# Patient Record
Sex: Female | Born: 1960 | Race: White | Hispanic: No | Marital: Married | Smoking: Current some day smoker
Health system: Southern US, Community
[De-identification: ages and names within clinical notes are randomized; demographics above are authoritative.]

## PROBLEM LIST (undated history)

## (undated) DIAGNOSIS — R519 Headache, unspecified: Secondary | ICD-10-CM

## (undated) DIAGNOSIS — E119 Type 2 diabetes mellitus without complications: Secondary | ICD-10-CM

## (undated) DIAGNOSIS — Z972 Presence of dental prosthetic device (complete) (partial): Secondary | ICD-10-CM

## (undated) DIAGNOSIS — I6529 Occlusion and stenosis of unspecified carotid artery: Secondary | ICD-10-CM

## (undated) DIAGNOSIS — E785 Hyperlipidemia, unspecified: Secondary | ICD-10-CM

## (undated) DIAGNOSIS — M539 Dorsopathy, unspecified: Secondary | ICD-10-CM

## (undated) DIAGNOSIS — K760 Fatty (change of) liver, not elsewhere classified: Secondary | ICD-10-CM

## (undated) DIAGNOSIS — R42 Dizziness and giddiness: Secondary | ICD-10-CM

## (undated) DIAGNOSIS — I10 Essential (primary) hypertension: Secondary | ICD-10-CM

## (undated) DIAGNOSIS — R51 Headache: Secondary | ICD-10-CM

## (undated) DIAGNOSIS — G629 Polyneuropathy, unspecified: Secondary | ICD-10-CM

## (undated) HISTORY — DX: Fatty (change of) liver, not elsewhere classified: K76.0

## (undated) HISTORY — PX: TONSILLECTOMY AND ADENOIDECTOMY: SUR1326

## (undated) HISTORY — DX: Type 2 diabetes mellitus without complications: E11.9

## (undated) HISTORY — PX: LAPAROSCOPIC CHOLECYSTECTOMY: SUR755

## (undated) HISTORY — DX: Hyperlipidemia, unspecified: E78.5

## (undated) HISTORY — PX: CHOLECYSTECTOMY: SHX55

## (undated) HISTORY — DX: Essential (primary) hypertension: I10

## (undated) HISTORY — PX: HEMORRHOID SURGERY: SHX153

## (undated) HISTORY — PX: ABDOMINAL HYSTERECTOMY: SHX81

## (undated) HISTORY — PX: DILATION AND CURETTAGE OF UTERUS: SHX78

---

## 1998-03-22 DIAGNOSIS — F410 Panic disorder [episodic paroxysmal anxiety] without agoraphobia: Secondary | ICD-10-CM | POA: Insufficient documentation

## 1998-03-22 DIAGNOSIS — I10 Essential (primary) hypertension: Secondary | ICD-10-CM | POA: Insufficient documentation

## 2002-11-23 DIAGNOSIS — F32A Depression, unspecified: Secondary | ICD-10-CM | POA: Insufficient documentation

## 2002-11-23 DIAGNOSIS — F329 Major depressive disorder, single episode, unspecified: Secondary | ICD-10-CM | POA: Insufficient documentation

## 2005-03-03 ENCOUNTER — Ambulatory Visit: Payer: Self-pay | Admitting: Family Medicine

## 2006-02-15 DIAGNOSIS — E785 Hyperlipidemia, unspecified: Secondary | ICD-10-CM | POA: Insufficient documentation

## 2006-02-15 DIAGNOSIS — N951 Menopausal and female climacteric states: Secondary | ICD-10-CM | POA: Insufficient documentation

## 2006-02-23 ENCOUNTER — Ambulatory Visit: Payer: Self-pay | Admitting: Family Medicine

## 2006-04-14 ENCOUNTER — Ambulatory Visit: Payer: Self-pay | Admitting: Unknown Physician Specialty

## 2007-03-28 ENCOUNTER — Ambulatory Visit: Payer: Self-pay | Admitting: Family Medicine

## 2007-11-21 DIAGNOSIS — E559 Vitamin D deficiency, unspecified: Secondary | ICD-10-CM | POA: Insufficient documentation

## 2008-05-22 ENCOUNTER — Ambulatory Visit: Payer: Self-pay | Admitting: Family Medicine

## 2008-10-29 DIAGNOSIS — F17210 Nicotine dependence, cigarettes, uncomplicated: Secondary | ICD-10-CM | POA: Insufficient documentation

## 2009-06-03 ENCOUNTER — Ambulatory Visit: Payer: Self-pay | Admitting: General Surgery

## 2009-06-13 ENCOUNTER — Ambulatory Visit: Payer: Self-pay | Admitting: General Surgery

## 2009-06-24 ENCOUNTER — Ambulatory Visit: Payer: Self-pay | Admitting: General Surgery

## 2009-07-23 ENCOUNTER — Ambulatory Visit: Payer: Self-pay | Admitting: Family Medicine

## 2010-08-04 ENCOUNTER — Ambulatory Visit: Payer: Self-pay | Admitting: Family Medicine

## 2012-11-17 ENCOUNTER — Ambulatory Visit: Payer: Self-pay | Admitting: Family Medicine

## 2012-11-22 ENCOUNTER — Ambulatory Visit: Payer: Self-pay | Admitting: Family Medicine

## 2013-01-16 ENCOUNTER — Ambulatory Visit: Payer: Self-pay | Admitting: Family Medicine

## 2013-07-05 ENCOUNTER — Encounter: Payer: Self-pay | Admitting: Family Medicine

## 2014-08-25 ENCOUNTER — Other Ambulatory Visit: Payer: Self-pay | Admitting: Family Medicine

## 2014-08-29 ENCOUNTER — Other Ambulatory Visit: Payer: Self-pay | Admitting: Family Medicine

## 2014-10-08 ENCOUNTER — Telehealth: Payer: Self-pay | Admitting: Family Medicine

## 2014-10-08 ENCOUNTER — Other Ambulatory Visit: Payer: Self-pay | Admitting: Family Medicine

## 2014-10-08 MED ORDER — ALBUTEROL SULFATE HFA 108 (90 BASE) MCG/ACT IN AERS: 2.0000 | INHALATION_SPRAY | Freq: Four times a day (QID) | RESPIRATORY_TRACT | Status: DC | PRN

## 2014-10-08 NOTE — Telephone Encounter (Signed)
Pt states she is having some wheezing.  Does not want to come in but would like for you to call her in a inhaler.  [facility] [place]/  Thanks, [place]

## 2014-10-08 NOTE — Telephone Encounter (Signed)
Have sent rx for proventil to [facility] [place]

## 2014-10-11 NOTE — Telephone Encounter (Signed)
Patient notified

## 2014-10-27 ENCOUNTER — Other Ambulatory Visit: Payer: Self-pay | Admitting: Family Medicine

## 2014-11-05 ENCOUNTER — Ambulatory Visit: Payer: BC Managed Care – PPO | Admitting: Family Medicine

## 2014-11-05 DIAGNOSIS — E119 Type 2 diabetes mellitus without complications: Secondary | ICD-10-CM | POA: Insufficient documentation

## 2014-11-05 DIAGNOSIS — G8929 Other chronic pain: Secondary | ICD-10-CM | POA: Insufficient documentation

## 2014-11-05 DIAGNOSIS — M7060 Trochanteric bursitis, unspecified hip: Secondary | ICD-10-CM | POA: Insufficient documentation

## 2014-11-05 DIAGNOSIS — M549 Dorsalgia, unspecified: Secondary | ICD-10-CM

## 2014-11-05 DIAGNOSIS — M19049 Primary osteoarthritis, unspecified hand: Secondary | ICD-10-CM | POA: Insufficient documentation

## 2014-11-05 DIAGNOSIS — E1165 Type 2 diabetes mellitus with hyperglycemia: Secondary | ICD-10-CM

## 2014-11-30 ENCOUNTER — Other Ambulatory Visit: Payer: Self-pay | Admitting: Family Medicine

## 2014-12-12 ENCOUNTER — Encounter: Payer: Self-pay | Admitting: Family Medicine

## 2014-12-12 ENCOUNTER — Ambulatory Visit (INDEPENDENT_AMBULATORY_CARE_PROVIDER_SITE_OTHER): Payer: BC Managed Care – PPO | Admitting: Family Medicine

## 2014-12-12 ENCOUNTER — Other Ambulatory Visit: Payer: Self-pay

## 2014-12-12 VITALS — BP 124/82 | HR 72 | Temp 98.7°F | Resp 16 | Wt 130.2 lb

## 2014-12-12 DIAGNOSIS — K760 Fatty (change of) liver, not elsewhere classified: Secondary | ICD-10-CM | POA: Diagnosis not present

## 2014-12-12 DIAGNOSIS — IMO0002 Reserved for concepts with insufficient information to code with codable children: Secondary | ICD-10-CM

## 2014-12-12 DIAGNOSIS — E1165 Type 2 diabetes mellitus with hyperglycemia: Secondary | ICD-10-CM | POA: Diagnosis not present

## 2014-12-12 DIAGNOSIS — I10 Essential (primary) hypertension: Secondary | ICD-10-CM

## 2014-12-12 DIAGNOSIS — E785 Hyperlipidemia, unspecified: Secondary | ICD-10-CM | POA: Diagnosis not present

## 2014-12-12 NOTE — Progress Notes (Signed)
Subjective:    Patient ID: Dana Weber, female    DOB: [birth year], 54 y.o.   MRN: [id number] Chief Complaint  Patient presents with  . Follow-up    ultrasound results   HPI Fatty Liver Diagnosed by abdominal ultrasound in April 2016. Some gas and epigastric indigestion at times. Some foul odor and gas production. Use antacid occasionally. Has had a couple episodes of nausea and vomiting (once) without persistence or specific trigger. Denies constipation or diarrhea.  Diabetes Tolerating Metformin and Januvia without side effects. Blood sugar occasionally checked and ranging from 100-160 (random). Denies vision changes, numbness or polyuria.  Hypertension Continues Benazepril/HCTZ without side effects. No chest pains, dyspnea or diaphoresis. Still smoking at least <1/2 ppd.  Hyperlipidemia Trying to follow a low fat diet with continuation of Lovastatin 20 mg qd. Denies regular exercise.  Tobacco Use Presently on Chantix prescribed by a doctor at [facility]. Continues coaching by this doctor and will follow up next week for her 1 month recheck.  Patient Active Problem List   Diagnosis Date Noted  . Back pain, chronic 11/05/2014  . Type II diabetes mellitus, uncontrolled 11/05/2014  . Arthritis of hand, degenerative 11/05/2014  . Bursitis, trochanteric 11/05/2014  . Compulsive tobacco user syndrome 10/29/2008  . Avitaminosis D 11/21/2007  . HLD (hyperlipidemia) 02/15/2006  . Menopausal symptom 02/15/2006  . Clinical depression 11/23/2002  . Essential (primary) hypertension 03/22/1998  . Episodic paroxysmal anxiety disorder 03/22/1998   Family History  Problem Relation Age of Onset  . Diabetes Mother   . Hypertension Mother   . Diabetes Father   . Hypertension Father   . Hypertension Brother   . Diabetes Paternal Grandmother   . Emphysema Paternal Grandfather   . Hypertension Brother    Past Surgical History  Procedure Laterality Date  . Abdominal hysterectomy     . Cholecystectomy    . Cesarean section    . Tonsillectomy and adenoidectomy  1960   Social History  Substance Use Topics  . Smoking status: Current Every Day Smoker -- 0.50 packs/day for 30 years    Types: Cigarettes  . Smokeless tobacco: None  . Alcohol Use: 0.0 oz/week    0 Standard drinks or equivalent per week     Comment: OCCASIONALLY   Allergies  Allergen Reactions  . Amoxicillin   . Hydrocodone Bitartrate Er Swelling   Current Outpatient Prescriptions on File Prior to Visit  Medication Sig Dispense Refill  . albuterol (PROVENTIL HFA) 108 (90 BASE) MCG/ACT inhaler Inhale 2 puffs into the lungs every 6 (six) hours as needed for wheezing or shortness of breath. 1 Inhaler 5  . B COMPLEX VITAMINS PO Take 1 tablet by mouth daily.    . benazepril-hydrochlorthiazide (LOTENSIN HCT) 10-12.5 MG per tablet TAKE 1 TABLET BY MOUTH DAILY. 30 tablet 1  . fluticasone (FLONASE) 50 MCG/ACT nasal spray Place 2 sprays into the nose daily.    Marland Kitchen JANUVIA 100 MG tablet TAKE 1 TABLET BY MOUTH DAILY 30 tablet 0  . lovastatin (MEVACOR) 20 MG tablet Take 1 tablet by mouth daily.    . meloxicam (MOBIC) 15 MG tablet Take 1 tablet by mouth daily.    . metFORMIN (GLUMETZA) 500 MG (MOD) 24 hr tablet Take 2 tablets by mouth 2 (two) times daily.    Marland Kitchen PARoxetine (PAXIL) 20 MG tablet TAKE 1 TABLET BY MOUTH DAILY 90 tablet 3   No current facility-administered medications on file prior to visit.   Review of Systems  Constitutional: Negative.   HENT: Negative.   Eyes: Negative.   Respiratory: Positive for cough. Negative for apnea.        Morning cough with some sputum production (white to brown color)  Cardiovascular: Negative.   Gastrointestinal: Positive for nausea and anal bleeding. Negative for blood in stool.       History of hemorrhoidal bleeding occasionally.  Endocrine: Negative.   Genitourinary: Negative.   Neurological: Negative.      BP 124/82 mmHg  Pulse 72  Temp(Src) 98.7 F (37.1 C)  (Oral)  Resp 16  Wt 130 lb 3.2 oz (59.058 kg)  Objective:   Physical Exam  Constitutional: She is oriented to person, place, and time. She appears well-developed and well-nourished.  HENT:  Head: Normocephalic and atraumatic.  Nose: Nose normal.  Mouth/Throat: Oropharynx is clear and moist.  Eyes: Conjunctivae are normal.  Neck: Normal range of motion. Neck supple.  Cardiovascular: Normal rate, regular rhythm and normal heart sounds.   Pulmonary/Chest: Effort normal and breath sounds normal.  Slight wheeze that clears with a couple deep breaths.  Abdominal: Soft. Bowel sounds are normal.  Musculoskeletal: Normal range of motion.  Neurological: She is oriented to person, place, and time. She has normal reflexes.  Skin: No rash noted.  Psychiatric: She has a normal mood and affect. Her behavior is normal. Thought content normal.       Assessment & Plan:  1. Fatty liver  In April 2016 with normal lipase, liver enzymes and history of cholecystectomy in 1993, abdominal ultrasound showed echogenic liver that may reflect underlying hepatic steatosis. Never had any jaundice. Occasional abdominal discomfort in upper abdomen. Will recheck labs and recommend [place] diet. Probably should consider referral to gastroenterologist. - Comprehensive metabolic panel - Amylase  2. Essential (primary) hypertension BP in good control today. Still taking Lotensin-HCT 10/12.5 mg qd without side effects. Recheck routine labs and continue present dosage. - CBC with Differential/Platelet  3. Type II diabetes mellitus, uncontrolled Presently on Metformin 500 mg 2 tablets BID with Januvia 100 mg qd the past 4 months. Last Hgb A1C on 07-08-14 was 8.9 when she was switched back to this regimen. Will recheck labs. - Hemoglobin A1c  4. HLD (hyperlipidemia) Stable on Lovastatin 20 mg qd without side effect symptoms. No myalgias or joint pains. Will recheck lipids and proceed with [place] diet. Recheck pending lab  reports. - Lipid panel - TSH

## 2014-12-12 NOTE — Patient Instructions (Signed)
Fatty Liver  Fatty liver is the accumulation of fat in liver cells. It is also called hepatosteatosis or steatohepatitis. It is normal for your liver to contain some fat. If fat is more than 5 to 10% of your liver's weight, you have fatty liver.   There are often no symptoms (problems) for years while damage is still occurring. People often learn about their fatty liver when they have medical tests for other reasons. Fat can damage your liver for years or even decades without causing problems. When it becomes severe, it can cause fatigue, weight loss, weakness, and confusion.  This makes you more likely to develop more serious liver problems. The liver is the largest organ in the body. It does a lot of work and often gives no warning signs when it is sick until late in a disease.  The liver has many important jobs including:  · Breaking down foods.  · Storing vitamins, iron, and other minerals.  · Making proteins.  · Making bile for food digestion.  · Breaking down many products including medications, alcohol and some poisons.  CAUSES   There are a number of different conditions, medications, and poisons that can cause a fatty liver. Eating too many calories causes fat to build up in the liver. Not processing and breaking fats down normally may also cause this. Certain conditions, such as obesity, diabetes, and high triglycerides also cause this. Most fatty liver patients tend to be middle-aged and over weight.   Some causes of fatty liver are:  · Alcohol over consumption.  · Malnutrition.  · Steroid use.  · Valproic acid toxicity.  · Obesity.  · Cushing's syndrome.  · Poisons.  · Tetracycline in high dosages.  · Pregnancy.  · Diabetes.  · Hyperlipidemia.  · Rapid weight loss.  Some people develop fatty liver even having none of these conditions.  SYMPTOMS   Fatty liver most often causes no problems. This is called asymptomatic.  · It can be diagnosed with blood tests and also by a liver biopsy.  · It is one of the  most common causes of minor elevations of liver enzymes on routine blood tests.  · Specialized Imaging of the liver using ultrasound, CT (computed tomography) scan, or MRI (magnetic resonance imaging) can suggest a fatty liver but a biopsy is needed to confirm it.  · A biopsy involves taking a small sample of liver tissue. This is done by using a needle. It is then looked at under a microscope by a specialist.  TREATMENT   It is important to treat the cause. Simple fatty liver without a medical reason may not need treatment.  · Weight loss, fat restriction, and exercise in overweight patients produces inconsistent results but is worth trying.  · Fatty liver due to alcohol toxicity may not improve even with stopping drinking.  · Good control of diabetes may reduce fatty liver.  · Lower your triglycerides through diet, medication or both.  · Eat a balanced, healthy diet.  · Increase your physical activity.  · Get regular checkups from a liver specialist.  · There are no medical or surgical treatments for a fatty liver or Mom, but improving your diet and increasing your exercise may help prevent or reverse some of the damage.  PROGNOSIS   Fatty liver may cause no damage or it can lead to an inflammation of the liver. This is, called steatohepatitis. When it is linked to alcohol abuse, it is called alcoholic steatohepatitis. It often   is not linked to alcohol. It is then called nonalcoholic steatohepatitis, or Whisner. Over time the liver may become scarred and hardened. This condition is called cirrhosis. Cirrhosis is serious and may lead to liver failure or cancer. Bushey is one of the leading causes of cirrhosis. About 10-20% of Americans have fatty liver and a smaller 2-5% has Briddell.  Document Released: 04/23/2005 Document Revised: 05/31/2011 Document Reviewed: 07/18/2013  ExitCare® Patient Information ©2015 [employer]. This information is not intended to replace advice given to you by your health care provider. Make  sure you discuss any questions you have with your health care provider.

## 2014-12-13 LAB — COMPREHENSIVE METABOLIC PANEL
A/G RATIO: 1.9 (ref 1.1–2.5)
ALT: 39 IU/L — ABNORMAL HIGH (ref 0–32)
AST: 23 IU/L (ref 0–40)
Albumin: 4.4 g/dL (ref 3.5–5.5)
Alkaline Phosphatase: 75 IU/L (ref 39–117)
BILIRUBIN TOTAL: 0.4 mg/dL (ref 0.0–1.2)
BUN/Creatinine Ratio: 13 (ref 9–23)
BUN: 8 mg/dL (ref 6–24)
CALCIUM: 9.6 mg/dL (ref 8.7–10.2)
CO2: 26 mmol/L (ref 18–29)
Chloride: 97 mmol/L (ref 97–108)
Creatinine, Ser: 0.64 mg/dL (ref 0.57–1.00)
GFR calc Af Amer: 118 mL/min/{1.73_m2} (ref >59)
GFR, EST NON AFRICAN AMERICAN: 102 mL/min/{1.73_m2} (ref >59)
GLOBULIN, TOTAL: 2.3 g/dL (ref 1.5–4.5)
Glucose: 166 mg/dL — ABNORMAL HIGH (ref 65–99)
POTASSIUM: 4.7 mmol/L (ref 3.5–5.2)
SODIUM: 139 mmol/L (ref 134–144)
Total Protein: 6.7 g/dL (ref 6.0–8.5)

## 2014-12-13 LAB — LIPID PANEL
Chol/HDL Ratio: 4.1 ratio units (ref 0.0–4.4)
Cholesterol, Total: 170 mg/dL (ref 100–199)
HDL: 41 mg/dL (ref >39)
LDL CALC: 92 mg/dL (ref 0–99)
Triglycerides: 184 mg/dL — ABNORMAL HIGH (ref 0–149)
VLDL CHOLESTEROL CAL: 37 mg/dL (ref 5–40)

## 2014-12-13 LAB — HEMOGLOBIN A1C
ESTIMATED AVERAGE GLUCOSE: 203 mg/dL
Hgb A1c MFr Bld: 8.7 % — ABNORMAL HIGH (ref 4.8–5.6)

## 2014-12-13 LAB — CBC WITH DIFFERENTIAL/PLATELET
BASOS: 1 %
Basophils Absolute: 0.1 10*3/uL (ref 0.0–0.2)
EOS (ABSOLUTE): 0.1 10*3/uL (ref 0.0–0.4)
EOS: 2 %
HEMATOCRIT: 40.7 % (ref 34.0–46.6)
Hemoglobin: 13.8 g/dL (ref 11.1–15.9)
IMMATURE GRANULOCYTES: 0 %
Immature Grans (Abs): 0 10*3/uL (ref 0.0–0.1)
LYMPHS ABS: 1.7 10*3/uL (ref 0.7–3.1)
Lymphs: 32 %
MCH: 29.6 pg (ref 26.6–33.0)
MCHC: 33.9 g/dL (ref 31.5–35.7)
MCV: 87 fL (ref 79–97)
MONOS ABS: 0.4 10*3/uL (ref 0.1–0.9)
Monocytes: 8 %
NEUTROS ABS: 3.1 10*3/uL (ref 1.4–7.0)
Neutrophils: 57 %
Platelets: 295 10*3/uL (ref 150–379)
RBC: 4.66 x10E6/uL (ref 3.77–5.28)
RDW: 14.2 % (ref 12.3–15.4)
WBC: 5.4 10*3/uL (ref 3.4–10.8)

## 2014-12-13 LAB — AMYLASE: Amylase: 39 U/L (ref 31–124)

## 2014-12-13 LAB — TSH: TSH: 0.939 u[IU]/mL (ref 0.450–4.500)

## 2014-12-17 ENCOUNTER — Telehealth: Payer: Self-pay

## 2014-12-17 DIAGNOSIS — E119 Type 2 diabetes mellitus without complications: Secondary | ICD-10-CM

## 2014-12-17 MED ORDER — CANAGLIFLOZIN 100 MG PO TABS
100.0000 mg | ORAL_TABLET | Freq: Every day | ORAL | Status: DC
Start: 2014-12-17 — End: 2015-01-13

## 2014-12-17 NOTE — Telephone Encounter (Signed)
LMTCB

## 2014-12-17 NOTE — Telephone Encounter (Signed)
-----   Message from Tamsen Roers, [place] sent at 12/13/2014  6:12 PM EDT ----- Blood tests essentially normal except blood sugar high at 166 and Hgb A1C still above 8 (8.7 now - was 8.9 in April). Need better control of diabetes. Continue Metformin 500 mg 2 BID and Januvia 100 mg qd then add Invokana 100 mg q am (may give 25 tablet samples and recheck response in 3 weeks). If desired, could get gastroenterology referral to evaluate fatty liver changes. Usually the diet given would help.

## 2014-12-17 NOTE — Telephone Encounter (Signed)
Patient advised as directed below. Patient verbalized understanding and agrees with treatment plan. Invokana 100 mg samples left at front desk. Patient will try diet before proceeding with GI referral. Patient will schedule a follow up appointment when she comes in to pick up samples.

## 2014-12-25 ENCOUNTER — Other Ambulatory Visit: Payer: Self-pay | Admitting: Family Medicine

## 2015-01-03 ENCOUNTER — Other Ambulatory Visit: Payer: Self-pay | Admitting: Family Medicine

## 2015-01-03 NOTE — Telephone Encounter (Signed)
Pt stating she need a refill on JANUVIA 100 MG tablet [facility]. Pt states she only has 1 one.

## 2015-01-04 MED ORDER — SITAGLIPTIN PHOSPHATE 100 MG PO TABS: 100.0000 mg | ORAL_TABLET | Freq: Every day | ORAL | Status: DC

## 2015-01-09 LAB — HM MAMMOGRAPHY: HM Mammogram: NEGATIVE

## 2015-01-13 ENCOUNTER — Other Ambulatory Visit: Payer: Self-pay | Admitting: Family Medicine

## 2015-01-13 ENCOUNTER — Ambulatory Visit (INDEPENDENT_AMBULATORY_CARE_PROVIDER_SITE_OTHER): Payer: BC Managed Care – PPO | Admitting: Family Medicine

## 2015-01-13 ENCOUNTER — Encounter: Payer: Self-pay | Admitting: Family Medicine

## 2015-01-13 VITALS — BP 118/72 | HR 80 | Temp 99.2°F | Resp 12 | Wt 130.0 lb

## 2015-01-13 DIAGNOSIS — E1100 Type 2 diabetes mellitus with hyperosmolarity without nonketotic hyperglycemic-hyperosmolar coma (NKHHC): Secondary | ICD-10-CM | POA: Diagnosis not present

## 2015-01-13 DIAGNOSIS — M19042 Primary osteoarthritis, left hand: Secondary | ICD-10-CM | POA: Diagnosis not present

## 2015-01-13 DIAGNOSIS — K760 Fatty (change of) liver, not elsewhere classified: Secondary | ICD-10-CM

## 2015-01-13 DIAGNOSIS — M19041 Primary osteoarthritis, right hand: Secondary | ICD-10-CM | POA: Diagnosis not present

## 2015-01-13 DIAGNOSIS — R1010 Upper abdominal pain, unspecified: Secondary | ICD-10-CM

## 2015-01-13 MED ORDER — CANAGLIFLOZIN 100 MG PO TABS: 100.0000 mg | ORAL_TABLET | Freq: Every day | ORAL | Status: DC

## 2015-01-13 NOTE — Progress Notes (Signed)
Patient ID: Dana MimesMarcia Jolayne Weber, female   DOB: 08/20/1960, 54 y.o.   MRN: [id number]     Visit Date: 01/13/2015  Today's Provider: Dortha Kernennis Chrismon, PA   Chief Complaint  Patient presents with  . Diabetes   Subjective:    HPI Patient is here for diabetes follow up. Her last office visit was in September and at that time Theodis Satonvokana was added. Patient is tolerating it ok. She states her average sugar is about 130 but not necessarily fasting it can be during the day too. She can not say if sugar readings improved with the new medication, she states "depends on the time of the day."  Patient Active Problem List   Diagnosis Date Noted  . Fatty liver 12/12/2014  . Back pain, chronic 11/05/2014  . Type II diabetes mellitus, uncontrolled (HCC) 11/05/2014  . Arthritis of hand, degenerative 11/05/2014  . Bursitis, trochanteric 11/05/2014  . Compulsive tobacco user syndrome 10/29/2008  . Avitaminosis D 11/21/2007  . HLD (hyperlipidemia) 02/15/2006  . Menopausal symptom 02/15/2006  . Clinical depression 11/23/2002  . Essential (primary) hypertension 03/22/1998  . Episodic paroxysmal anxiety disorder 03/22/1998   Past Surgical History  Procedure Laterality Date  . Abdominal hysterectomy    . Cholecystectomy    . Cesarean section    . Tonsillectomy and adenoidectomy  1960   Family History  Problem Relation Age of Onset  . Diabetes Mother   . Hypertension Mother   . Diabetes Father   . Hypertension Father   . Hypertension Brother   . Diabetes Paternal Grandmother   . Emphysema Paternal Grandfather   . Hypertension Brother    Lab Results  Component Value Date   HGBA1C 8.7* 12/12/2014   Allergies  Allergen Reactions  . Amoxicillin   . Hydrocodone Bitartrate Er Swelling   Previous Medications   ALBUTEROL (PROVENTIL HFA) 108 (90 BASE) MCG/ACT INHALER    Inhale 2 puffs into the lungs every 6 (six) hours as needed for wheezing or shortness of breath.   B COMPLEX VITAMINS PO    Take  1 tablet by mouth daily.   BENAZEPRIL-HYDROCHLORTHIAZIDE (LOTENSIN HCT) 10-12.5 MG TABLET    TAKE 1 TABLET BY MOUTH DAILY.   CANAGLIFLOZIN (INVOKANA) 100 MG TABS TABLET    Take 1 tablet (100 mg total) by mouth daily.   CHANTIX CONTINUING MONTH PAK 1 MG TABLET       FLUTICASONE (FLONASE) 50 MCG/ACT NASAL SPRAY    Place 2 sprays into the nose daily.   LOVASTATIN (MEVACOR) 20 MG TABLET    Take 1 tablet by mouth daily.   MELOXICAM (MOBIC) 15 MG TABLET    Take 1 tablet by mouth daily.   METFORMIN (GLUMETZA) 500 MG (MOD) 24 HR TABLET    Take 2 tablets by mouth 2 (two) times daily.   PAROXETINE (PAXIL) 20 MG TABLET    TAKE 1 TABLET BY MOUTH DAILY   SITAGLIPTIN (JANUVIA) 100 MG TABLET    Take 1 tablet (100 mg total) by mouth daily.   Review of Systems  Constitutional: Positive for fatigue. Negative for fever, chills, appetite change and unexpected weight change.  Respiratory: Positive for cough (since saturday) and wheezing (better).   Cardiovascular: Positive for leg swelling (ankles swell). Negative for chest pain and palpitations.  Gastrointestinal: Positive for nausea, abdominal pain (unchanged), diarrhea and constipation. Negative for blood in stool.  Musculoskeletal: Positive for back pain, joint swelling and arthralgias. Negative for gait problem.  Neurological: Positive for dizziness, light-headedness and headaches.  Social History  Substance Use Topics  . Smoking status: Current Every Day Smoker -- 0.25 packs/day for 30 years    Types: Cigarettes  . Smokeless tobacco: Never Used  . Alcohol Use: 0.0 oz/week    0 Standard drinks or equivalent per week     Comment: OCCASIONALLY   Objective:   BP 118/72 mmHg  Pulse 80  Temp(Src) 99.2 F (37.3 C)  Resp 12  Wt 130 lb (58.968 kg)  Physical Exam  Constitutional: She is oriented to person, place, and time. She appears well-developed and well-nourished.  HENT:  Head: Normocephalic.  Right Ear: External ear normal.  Left Ear:  External ear normal.  Cardiovascular: Normal rate and regular rhythm.   Pulmonary/Chest: Effort normal and breath sounds normal.  Slight wheeze with first breath then goes away.  Abdominal: Soft. Bowel sounds are normal. There is tenderness.  Soreness in RUQ. No masses. History of fatty liver.  Neurological: She is alert and oriented to person, place, and time.  Normal sensation in feet to test with nylon string.  Psychiatric: She has a normal mood and affect. Her behavior is normal. Thought content normal.   Assessment & Plan:     1. Uncontrolled type 2 diabetes mellitus with hyperosmolarity without coma, without long-term current use of insulin (HCC) Better control of blood sugar at home. Continues to take the Metformin, Januvia and Invokana without side effects. Will continue present diet and recheck Hgb A1C in 2 months. - canagliflozin (INVOKANA) 100 MG TABS tablet; Take 1 tablet (100 mg total) by mouth daily.  Dispense: 30 tablet; Refill: 3  2. Osteoarthritis of both hands, unspecified osteoarthritis type Intermittent ache and puffiness in hands. May use Meloxicam as needed.  3. Fatty liver Some soreness in RUQ. Last month labs showed only slight elevation of one liver enzyme (ALT 39). Will get follow up labs and check pancreas. - COMPLETE METABOLIC PANEL WITH GFR - Amylase  4. Upper abdominal pain Intermittent LUQ discomfort. Concerned about pancreas function. Will check CMP and Amylase. No pain today. May continue antacid prn.       Dortha Kern, PA  [facility]

## 2015-01-14 LAB — COMPREHENSIVE METABOLIC PANEL
ALT: 32 IU/L (ref 0–32)
AST: 15 IU/L (ref 0–40)
Albumin/Globulin Ratio: 2.1 (ref 1.1–2.5)
Albumin: 4.4 g/dL (ref 3.5–5.5)
Alkaline Phosphatase: 71 IU/L (ref 39–117)
BUN/Creatinine Ratio: 16 (ref 9–23)
BUN: 10 mg/dL (ref 6–24)
Bilirubin Total: 0.5 mg/dL (ref 0.0–1.2)
CALCIUM: 10.1 mg/dL (ref 8.7–10.2)
CO2: 25 mmol/L (ref 18–29)
Chloride: 99 mmol/L (ref 97–106)
Creatinine, Ser: 0.64 mg/dL (ref 0.57–1.00)
GFR, EST AFRICAN AMERICAN: 118 mL/min/{1.73_m2} (ref >59)
GFR, EST NON AFRICAN AMERICAN: 102 mL/min/{1.73_m2} (ref >59)
GLUCOSE: 159 mg/dL — AB (ref 65–99)
Globulin, Total: 2.1 g/dL (ref 1.5–4.5)
Potassium: 4.9 mmol/L (ref 3.5–5.2)
Sodium: 142 mmol/L (ref 136–144)
TOTAL PROTEIN: 6.5 g/dL (ref 6.0–8.5)

## 2015-01-14 LAB — AMYLASE: Amylase: 46 U/L (ref 31–124)

## 2015-01-15 ENCOUNTER — Other Ambulatory Visit: Payer: Self-pay

## 2015-01-15 DIAGNOSIS — E1100 Type 2 diabetes mellitus with hyperosmolarity without nonketotic hyperglycemic-hyperosmolar coma (NKHHC): Secondary | ICD-10-CM

## 2015-01-15 MED ORDER — CANAGLIFLOZIN 100 MG PO TABS: 100.0000 mg | ORAL_TABLET | Freq: Every day | ORAL | Status: DC

## 2015-01-15 NOTE — Telephone Encounter (Signed)
Patient states that Maurine MinisterDennis was suppose to send a refill for Invokana to [facility] on 01/13/2015. It appears RX was not changed in system so it was not escribed. RX sent to [facility].

## 2015-01-16 ENCOUNTER — Telehealth: Payer: Self-pay

## 2015-01-16 DIAGNOSIS — K760 Fatty (change of) liver, not elsewhere classified: Secondary | ICD-10-CM

## 2015-01-16 NOTE — Telephone Encounter (Signed)
-----   Message from Tamsen Roersennis E Chrismon, [place] sent at 01/16/2015  2:18 AM EDT ----- Blood sugar a little better. Liver enzymes and pancreas test all in the normal range now. Recommend probiotics to help with digestion and may use Gas-X if any gas build up. Continue present diabetes regimen and recheck Hgb A1C in 2 months as planned.

## 2015-01-16 NOTE — Telephone Encounter (Signed)
Patient advised as directed below. Patient verbalized understanding. Patient requested to proceed with GI referral as indicated in message on 12/17/2014.

## 2015-01-16 NOTE — Telephone Encounter (Signed)
LMTCB

## 2015-01-17 ENCOUNTER — Telehealth: Payer: Self-pay

## 2015-01-17 NOTE — Telephone Encounter (Signed)
GI referral ordered

## 2015-01-17 NOTE — Telephone Encounter (Signed)
Patient had mammogram done on 01/09/2015. Results are in and have been reviewed by Dr. Sherrie MustacheFisher. Results: Bi-rads Cat 1 negative. Tried calling patient to advised her of results. Left message to call back. Please advise patient of these results.

## 2015-01-22 NOTE — Telephone Encounter (Signed)
Left message on vm advising pt to return call.

## 2015-01-30 ENCOUNTER — Telehealth: Payer: Self-pay | Admitting: Family Medicine

## 2015-01-30 ENCOUNTER — Other Ambulatory Visit: Payer: Self-pay | Admitting: Family Medicine

## 2015-01-30 NOTE — Telephone Encounter (Signed)
Pt is returning call.  ZO#[phone]/[id number]#[phone]/MW

## 2015-01-30 NOTE — Telephone Encounter (Signed)
LMOVM for pt to return call 

## 2015-01-31 ENCOUNTER — Telehealth: Payer: Self-pay | Admitting: Family Medicine

## 2015-01-31 NOTE — Telephone Encounter (Signed)
LMOVM for pt to return call 

## 2015-01-31 NOTE — Telephone Encounter (Signed)
Patient notified of results.

## 2015-01-31 NOTE — Telephone Encounter (Signed)
Pt returning call.  UJ#[phone]/[id number]#[phone]/MW

## 2015-01-31 NOTE — Telephone Encounter (Signed)
Patient was notified of results.  

## 2015-01-31 NOTE — Telephone Encounter (Signed)
Patient returned call

## 2015-02-27 ENCOUNTER — Other Ambulatory Visit: Payer: Self-pay | Admitting: Family Medicine

## 2015-03-03 ENCOUNTER — Encounter (INDEPENDENT_AMBULATORY_CARE_PROVIDER_SITE_OTHER): Payer: Self-pay

## 2015-03-03 ENCOUNTER — Ambulatory Visit (INDEPENDENT_AMBULATORY_CARE_PROVIDER_SITE_OTHER): Payer: BC Managed Care – PPO | Admitting: Gastroenterology

## 2015-03-03 ENCOUNTER — Encounter: Payer: Self-pay | Admitting: Gastroenterology

## 2015-03-03 VITALS — BP 148/86 | HR 84 | Temp 98.9°F | Ht 60.0 in | Wt 132.0 lb

## 2015-03-03 DIAGNOSIS — R197 Diarrhea, unspecified: Secondary | ICD-10-CM | POA: Diagnosis not present

## 2015-03-03 DIAGNOSIS — R1115 Cyclical vomiting syndrome unrelated to migraine: Secondary | ICD-10-CM

## 2015-03-03 DIAGNOSIS — G43A0 Cyclical vomiting, not intractable: Secondary | ICD-10-CM | POA: Diagnosis not present

## 2015-03-03 NOTE — Progress Notes (Signed)
Gastroenterology Consultation  Referring Provider:     Malva Limes, MD Primary Care Physician:  Mila Merry, MD Primary Gastroenterologist:  Dr. Servando Snare     Reason for Consultation:  Diarrhea with nausea and fatty liver        HPI:   Dana Weber is a 54 y.o. y/o female referred for consultation & management of  Diarrhea with nausea by Dr. Mila Merry, MD.   This patient comes today for consultation after being reported to have fatty liver. The patient ultrasound is not readily available. The patient does report that she is having diarrhea that has been persistent the last six months. She also reports that she has had some minor weight loss with the diarrhea. She has abdominal pain that is in multiple locations. She also reports that she has abdominal pain right after eating. She states she had a colonoscopy in the past by a local surgeon and was told that she had polyps. She is not sure if she was told that she need a repeat colonoscopy. The patient has had continuous diarrhea multiple times a day. She denies any black stools or bloody stools. She also denies the diarrhea waking her up in the middle of night sleep.  Past Medical History  Diagnosis Date  . Hypertension   . Hyperlipidemia   . Fatty liver   . Diabetes mellitus without complication [facility])     Past Surgical History  Procedure Laterality Date  . Abdominal hysterectomy    . Cholecystectomy    . Cesarean section    . Tonsillectomy and adenoidectomy  1960  . Hemorrhoid surgery      Prior to Admission medications   Medication Sig Start Date End Date Taking? Authorizing Provider  albuterol (PROVENTIL HFA) 108 (90 BASE) MCG/ACT inhaler Inhale 2 puffs into the lungs every 6 (six) hours as needed for wheezing or shortness of breath. 10/08/14  Yes Malva Limes, MD  benazepril-hydrochlorthiazide (LOTENSIN HCT) 10-12.5 MG tablet TAKE 1 TABLET BY MOUTH DAILY. 02/27/15  Yes Malva Limes, MD  CHANTIX CONTINUING  MONTH PAK 1 MG tablet  12/05/14  Yes Historical Provider, MD  fluticasone (FLONASE) 50 MCG/ACT nasal spray Place 2 sprays into the nose daily. 03/08/14  Yes Historical Provider, MD  lovastatin (MEVACOR) 20 MG tablet TAKE 1 TABLET BY MOUTH EVERY DAY 01/30/15  Yes Malva Limes, MD  metFORMIN (GLUMETZA) 500 MG (MOD) 24 hr tablet Take 2 tablets by mouth 2 (two) times daily. 12/17/13  Yes Historical Provider, MD  PARoxetine (PAXIL) 20 MG tablet TAKE 1 TABLET BY MOUTH DAILY 08/25/14  Yes Malva Limes, MD  sitaGLIPtin (JANUVIA) 100 MG tablet Take 1 tablet (100 mg total) by mouth daily. 01/04/15  Yes Malva Limes, MD  B COMPLEX VITAMINS PO Take 1 tablet by mouth daily.    Historical Provider, MD  canagliflozin (INVOKANA) 100 MG TABS tablet Take 1 tablet (100 mg total) by mouth daily. Patient not taking: Reported on 03/03/2015 01/15/15   Jodell Cipro Chrismon, PA  meloxicam (MOBIC) 15 MG tablet Take 1 tablet by mouth daily. 12/12/12   Historical Provider, MD    Family History  Problem Relation Age of Onset  . Diabetes Mother   . Hypertension Mother   . Diabetes Father   . Hypertension Father   . Hypertension Brother   . Diabetes Paternal Grandmother   . Emphysema Paternal Grandfather   . Hypertension Brother      Social History  Substance  Use Topics  . Smoking status: Current Every Day Smoker -- 0.25 packs/day for 30 years    Types: Cigarettes  . Smokeless tobacco: Never Used  . Alcohol Use: 0.0 oz/week    0 Standard drinks or equivalent per week     Comment: OCCASIONALLY    Allergies as of 03/03/2015 - Review Complete 03/03/2015  Allergen Reaction Noted  . Amoxicillin  11/05/2014  . Hydrocodone bitartrate er Swelling 11/05/2014    Review of Systems:    All systems reviewed and negative except where noted in HPI.   Physical Exam:  BP 148/86 mmHg  Pulse 84  Temp(Src) 98.9 F (37.2 C) (Oral)  Ht 5' (1.524 m)  Wt 132 lb (59.875 kg)  BMI 25.78 kg/m2 No LMP recorded. Patient has  had a hysterectomy. Psych:  Alert and cooperative. Normal mood and affect. General:   Alert,  Well-developed, well-nourished, pleasant and cooperative in NAD Head:  Normocephalic and atraumatic. Eyes:  Sclera clear, no icterus.   Conjunctiva pink. Ears:  Normal auditory acuity. Nose:  No deformity, discharge, or lesions. Mouth:  No deformity or lesions,oropharynx pink & moist. Neck:  Supple; no masses or thyromegaly. Lungs:  Respirations even and unlabored.  Clear throughout to auscultation.   No wheezes, crackles, or rhonchi. No acute distress. Heart:  Regular rate and rhythm; no murmurs, clicks, rubs, or gallops. Abdomen:  Normal bowel sounds.  No bruits.  Soft, non-tender and non-distended without masses, hepatosplenomegaly or hernias noted.  No guarding or rebound tenderness.  Negative Carnett sign.   Rectal:  Deferred.  Msk:  Symmetrical without gross deformities.  Good, equal movement & strength bilaterally. Pulses:  Normal pulses noted. Extremities:  No clubbing or edema.  No cyanosis. Neurologic:  Alert and oriented x3;  grossly normal neurologically. Skin:  Intact without significant lesions or rashes.  No jaundice. Lymph Nodes:  No significant cervical adenopathy. Psych:  Alert and cooperative. Normal mood and affect.  Imaging Studies: No results found.  Assessment and Plan:   Dana Weber is a 54 y.o. y/o female  who comes today with a history of diarrhea and nausea. There is some mention of the patient having fatty liver although her liver enzymes are normal and I do not see an ultrasound for this patient. Despite the patient possibly having a fatty liver, her liver enzymes are normal and need no further workup. As for her chronic diarrhea and nausea with abdominal pain and multiple locations, she will be set up for an EGD and colonoscopy. She also has a history of colon polyps.I have discussed risks & benefits which include, but are not limited to, bleeding, infection,  perforation & drug reaction.  The patient agrees with this plan & written consent will be obtained.      Note: This dictation was prepared with Dragon dictation along with smaller phrase technology. Any transcriptional errors that result from this process are unintentional.

## 2015-03-04 ENCOUNTER — Other Ambulatory Visit: Payer: Self-pay

## 2015-03-12 ENCOUNTER — Encounter: Payer: Self-pay | Admitting: *Deleted

## 2015-03-19 NOTE — Discharge Instructions (Signed)

## 2015-03-20 ENCOUNTER — Encounter
Admission: RE | Disposition: A | Discharge status: Home / Self Care | Payer: Self-pay | Source: Ambulatory Visit | Attending: Gastroenterology

## 2015-03-20 ENCOUNTER — Ambulatory Visit: Payer: BC Managed Care – PPO | Admitting: Student in an Organized Health Care Education/Training Program

## 2015-03-20 ENCOUNTER — Ambulatory Visit
Admission: RE | Admit: 2015-03-20 | Discharge: 2015-03-20 | Disposition: A | Discharge status: Home / Self Care | Payer: BC Managed Care – PPO | Source: Ambulatory Visit | Attending: Gastroenterology | Admitting: Gastroenterology

## 2015-03-20 ENCOUNTER — Other Ambulatory Visit: Payer: Self-pay | Admitting: Gastroenterology

## 2015-03-20 DIAGNOSIS — Z825 Family history of asthma and other chronic lower respiratory diseases: Secondary | ICD-10-CM | POA: Insufficient documentation

## 2015-03-20 DIAGNOSIS — K760 Fatty (change of) liver, not elsewhere classified: Secondary | ICD-10-CM | POA: Diagnosis not present

## 2015-03-20 DIAGNOSIS — Z7951 Long term (current) use of inhaled steroids: Secondary | ICD-10-CM | POA: Insufficient documentation

## 2015-03-20 DIAGNOSIS — Z885 Allergy status to narcotic agent status: Secondary | ICD-10-CM | POA: Diagnosis not present

## 2015-03-20 DIAGNOSIS — K449 Diaphragmatic hernia without obstruction or gangrene: Secondary | ICD-10-CM | POA: Insufficient documentation

## 2015-03-20 DIAGNOSIS — E119 Type 2 diabetes mellitus without complications: Secondary | ICD-10-CM | POA: Diagnosis not present

## 2015-03-20 DIAGNOSIS — I10 Essential (primary) hypertension: Secondary | ICD-10-CM | POA: Diagnosis not present

## 2015-03-20 DIAGNOSIS — R42 Dizziness and giddiness: Secondary | ICD-10-CM | POA: Insufficient documentation

## 2015-03-20 DIAGNOSIS — K5730 Diverticulosis of large intestine without perforation or abscess without bleeding: Secondary | ICD-10-CM | POA: Insufficient documentation

## 2015-03-20 DIAGNOSIS — Z860100 Personal history of colon polyps, unspecified: Secondary | ICD-10-CM | POA: Insufficient documentation

## 2015-03-20 DIAGNOSIS — R110 Nausea: Secondary | ICD-10-CM | POA: Insufficient documentation

## 2015-03-20 DIAGNOSIS — F418 Other specified anxiety disorders: Secondary | ICD-10-CM | POA: Insufficient documentation

## 2015-03-20 DIAGNOSIS — Z881 Allergy status to other antibiotic agents status: Secondary | ICD-10-CM | POA: Diagnosis not present

## 2015-03-20 DIAGNOSIS — Z8249 Family history of ischemic heart disease and other diseases of the circulatory system: Secondary | ICD-10-CM | POA: Insufficient documentation

## 2015-03-20 DIAGNOSIS — Z79899 Other long term (current) drug therapy: Secondary | ICD-10-CM | POA: Diagnosis not present

## 2015-03-20 DIAGNOSIS — K2970 Gastritis, unspecified, without bleeding: Secondary | ICD-10-CM | POA: Diagnosis not present

## 2015-03-20 DIAGNOSIS — K640 First degree hemorrhoids: Secondary | ICD-10-CM | POA: Diagnosis not present

## 2015-03-20 DIAGNOSIS — Z9049 Acquired absence of other specified parts of digestive tract: Secondary | ICD-10-CM | POA: Insufficient documentation

## 2015-03-20 DIAGNOSIS — M1990 Unspecified osteoarthritis, unspecified site: Secondary | ICD-10-CM | POA: Diagnosis not present

## 2015-03-20 DIAGNOSIS — Z90710 Acquired absence of both cervix and uterus: Secondary | ICD-10-CM | POA: Insufficient documentation

## 2015-03-20 DIAGNOSIS — E785 Hyperlipidemia, unspecified: Secondary | ICD-10-CM | POA: Diagnosis not present

## 2015-03-20 DIAGNOSIS — R197 Diarrhea, unspecified: Secondary | ICD-10-CM | POA: Insufficient documentation

## 2015-03-20 DIAGNOSIS — F17210 Nicotine dependence, cigarettes, uncomplicated: Secondary | ICD-10-CM | POA: Diagnosis not present

## 2015-03-20 DIAGNOSIS — Z7984 Long term (current) use of oral hypoglycemic drugs: Secondary | ICD-10-CM | POA: Insufficient documentation

## 2015-03-20 HISTORY — DX: Headache: R51

## 2015-03-20 HISTORY — DX: Presence of dental prosthetic device (complete) (partial): Z97.2

## 2015-03-20 HISTORY — DX: Dizziness and giddiness: R42

## 2015-03-20 HISTORY — PX: ESOPHAGOGASTRODUODENOSCOPY (EGD) WITH PROPOFOL: SHX5813

## 2015-03-20 HISTORY — DX: Headache, unspecified: R51.9

## 2015-03-20 HISTORY — PX: COLONOSCOPY WITH PROPOFOL: SHX5780

## 2015-03-20 HISTORY — DX: Dorsopathy, unspecified: M53.9

## 2015-03-20 LAB — GLUCOSE, CAPILLARY
Glucose-Capillary: 216 mg/dL — ABNORMAL HIGH (ref 65–99)
Glucose-Capillary: 164 mg/dL — ABNORMAL HIGH (ref 65–99)

## 2015-03-20 SURGERY — COLONOSCOPY WITH PROPOFOL
Anesthesia: Monitor Anesthesia Care | Wound class: Contaminated

## 2015-03-20 MED ORDER — HYDROMORPHONE HCL 1 MG/ML IJ SOLN: 0.2500 mg | INTRAMUSCULAR | Status: DC | PRN

## 2015-03-20 MED ORDER — LACTATED RINGERS IV SOLN
INTRAVENOUS | Status: DC
Start: 2015-03-20 — End: 2015-03-20
  Administered 2015-03-20: 07:00:00 via INTRAVENOUS

## 2015-03-20 MED ORDER — OXYCODONE HCL 5 MG/5ML PO SOLN: 5.0000 mg | Freq: Once | ORAL | Status: DC | PRN

## 2015-03-20 MED ORDER — STERILE WATER FOR IRRIGATION IR SOLN
Status: DC | PRN
  Administered 2015-03-20: 08:00:00

## 2015-03-20 MED ORDER — LIDOCAINE HCL (CARDIAC) 20 MG/ML IV SOLN
INTRAVENOUS | Status: DC | PRN
  Administered 2015-03-20: 40 mg via INTRAVENOUS

## 2015-03-20 MED ORDER — PROPOFOL 10 MG/ML IV BOLUS
INTRAVENOUS | Status: DC | PRN
  Administered 2015-03-20: 60 mg via INTRAVENOUS
  Administered 2015-03-20 (×3): 30 mg via INTRAVENOUS
  Administered 2015-03-20: 10 mg via INTRAVENOUS
  Administered 2015-03-20 (×2): 20 mg via INTRAVENOUS
  Administered 2015-03-20: 30 mg via INTRAVENOUS

## 2015-03-20 MED ORDER — GLYCOPYRROLATE 0.2 MG/ML IJ SOLN
INTRAMUSCULAR | Status: DC | PRN
Start: 2015-03-20 — End: 2015-03-20
  Administered 2015-03-20: .1 mg via INTRAVENOUS

## 2015-03-20 MED ORDER — OXYCODONE HCL 5 MG PO TABS: 5.0000 mg | ORAL_TABLET | Freq: Once | ORAL | Status: DC | PRN

## 2015-03-20 MED ORDER — PROMETHAZINE HCL 25 MG/ML IJ SOLN: 6.2500 mg | INTRAMUSCULAR | Status: DC | PRN

## 2015-03-20 MED ORDER — MEPERIDINE HCL 25 MG/ML IJ SOLN: 6.2500 mg | INTRAMUSCULAR | Status: DC | PRN

## 2015-03-20 SURGICAL SUPPLY — 39 items

## 2015-03-20 NOTE — Anesthesia Procedure Notes (Signed)
Procedure Name: MAC Performed by: Diontre Harps Pre-anesthesia Checklist: Patient identified, Emergency Drugs available, Suction available, Timeout performed and Patient being monitored Patient Re-evaluated:Patient Re-evaluated prior to inductionOxygen Delivery Method: Nasal cannula Placement Confirmation: positive ETCO2       

## 2015-03-20 NOTE — H&P (Signed)
[facility]  [address] [place], [place] [address] Phone: [phone] Fax : [phone]  Primary Care Physician:  Mila Merryonald Fisher, MD Primary Gastroenterologist:  Dr. Servando SnareWohl  Pre-Procedure History & Physical: HPI:  Dana Weber is a 54 y.o. female is here for an endoscopy and colonoscopy.   Past Medical History  Diagnosis Date  . Hypertension   . Hyperlipidemia   . Fatty liver   . Diabetes mellitus without complication (HCC)   . Headache     sinus  . Multilevel degenerative disc disease     "spurs" on spine also  . Vertigo     mild - daily - positional  . Wears dentures     partial lower    Past Surgical History  Procedure Laterality Date  . Abdominal hysterectomy    . Cholecystectomy    . Cesarean section    . Tonsillectomy and adenoidectomy  1960  . Hemorrhoid surgery    . Dilation and curettage of uterus      Prior to Admission medications   Medication Sig Start Date End Date Taking? Authorizing Provider  albuterol (PROVENTIL HFA) 108 (90 BASE) MCG/ACT inhaler Inhale 2 puffs into the lungs every 6 (six) hours as needed for wheezing or shortness of breath. 10/08/14  Yes Malva Limesonald E Fisher, MD  benazepril-hydrochlorthiazide (LOTENSIN HCT) 10-12.5 MG tablet TAKE 1 TABLET BY MOUTH DAILY. 02/27/15  Yes Malva Limesonald E Fisher, MD  CHANTIX CONTINUING MONTH PAK 1 MG tablet  12/05/14  Yes Historical Provider, MD  fluticasone (FLONASE) 50 MCG/ACT nasal spray Place 2 sprays into the nose daily. 03/08/14  Yes Historical Provider, MD  lovastatin (MEVACOR) 20 MG tablet TAKE 1 TABLET BY MOUTH EVERY DAY 01/30/15  Yes Malva Limesonald E Fisher, MD  metFORMIN (GLUMETZA) 500 MG (MOD) 24 hr tablet Take 2 tablets by mouth 2 (two) times daily. 12/17/13  Yes Historical Provider, MD  PARoxetine (PAXIL) 20 MG tablet TAKE 1 TABLET BY MOUTH DAILY 08/25/14  Yes Malva Limesonald E Fisher, MD  sitaGLIPtin (JANUVIA) 100 MG tablet Take 1 tablet (100 mg total) by mouth daily. 01/04/15  Yes Malva Limesonald E  Fisher, MD  B COMPLEX VITAMINS PO Take 1 tablet by mouth daily.    Historical Provider, MD  canagliflozin (INVOKANA) 100 MG TABS tablet Take 1 tablet (100 mg total) by mouth daily. Patient not taking: Reported on 03/03/2015 01/15/15   Jodell Ciproennis E Chrismon, PA  meloxicam (MOBIC) 15 MG tablet Take 1 tablet by mouth daily. 12/12/12   Historical Provider, MD    Allergies as of 03/04/2015 - Review Complete 03/03/2015  Allergen Reaction Noted  . Amoxicillin  11/05/2014  . Hydrocodone bitartrate er Swelling 11/05/2014    Family History  Problem Relation Age of Onset  . Diabetes Mother   . Hypertension Mother   . Diabetes Father   . Hypertension Father   . Hypertension Brother   . Diabetes Paternal Grandmother   . Emphysema Paternal Grandfather   . Hypertension Brother     Social History   Social History  . Marital Status: Divorced    Spouse Name: N/A  . Number of Children: N/A  . Years of Education: N/A   Occupational History  . Not on file.   Social History Main Topics  . Smoking status: Current Every Day Smoker -- 0.25 packs/day for 30 years    Types: Cigarettes  . Smokeless tobacco: Never Used     Comment: trying to quit by end of 2016  . Alcohol Use: 0.0  oz/week    0 Standard drinks or equivalent per week     Comment: OCCASIONALLY  . Drug Use: No  . Sexual Activity: Not on file   Other Topics Concern  . Not on file   Social History Narrative    Review of Systems: See HPI, otherwise negative ROS  Physical Exam: BP 164/94 mmHg  Pulse 84  Temp(Src) 98.1 F (36.7 C)  Resp 16  Ht 5' (1.524 m)  Wt 124 lb (56.246 kg)  BMI 24.22 kg/m2  SpO2 97% General:   Alert,  pleasant and cooperative in NAD Head:  Normocephalic and atraumatic. Neck:  Supple; no masses or thyromegaly. Lungs:  Clear throughout to auscultation.    Heart:  Regular rate and rhythm. Abdomen:  Soft, nontender and nondistended. Normal bowel sounds, without guarding, and without rebound.     Neurologic:  Alert and  oriented x4;  grossly normal neurologically.  Impression/Plan: Dana Weber is here for an endoscopy and colonoscopy to be performed for diarrhea and nausea  Risks, benefits, limitations, and alternatives regarding  endoscopy and colonoscopy have been reviewed with the patient.  Questions have been answered.  All parties agreeable.   Darlina Rumpf, MD  03/20/2015, 7:38 AM

## 2015-03-20 NOTE — Op Note (Signed)
[facility] Gastroenterology Patient Name: Dana MurrayMarcia Wesely Procedure Date: 03/20/2015 7:55 AM MRN: [id number] Account #: [id number] Date of Birth: 03/26/1960 Admit Type: Outpatient Age: 54 [facility] Gender: Female Note Status: Finalized Procedure:         Upper GI endoscopy Indications:       Nausea Providers:         Midge Miniumarren Lindon Kiel, MD Referring MD:      Demetrios Isaacsonald E. Sherrie MustacheFisher, MD (Referring MD) Medicines:         Propofol per Anesthesia Complications:     No immediate complications. Procedure:         Pre-Anesthesia Assessment:                    - Prior to the procedure, a History and Physical was                     performed, and patient medications and allergies were                     reviewed. The patient's tolerance of previous anesthesia                     was also reviewed. The risks and benefits of the procedure                     and the sedation options and risks were discussed with the                     patient. All questions were answered, and informed consent                     was obtained. Prior Anticoagulants: The patient has taken                     no previous anticoagulant or antiplatelet agents. ASA                     Grade Assessment: II - A patient with mild systemic                     disease. After reviewing the risks and benefits, the                     patient was deemed in satisfactory condition to undergo                     the procedure.                    After obtaining informed consent, the endoscope was passed                     under direct vision. Throughout the procedure, the                     patient's blood pressure, pulse, and oxygen saturations                     were monitored continuously. The was introduced through                     the mouth, and advanced to the second part of duodenum.  The upper GI endoscopy was accomplished without                     difficulty. The patient  tolerated the procedure well. Findings:      A small hiatus hernia was present.      Localized mild inflammation characterized by erythema was found in the       gastric antrum. Biopsies were taken with a cold forceps for histology.      The examined duodenum was normal. Impression:        - Small hiatus hernia.                    - Gastritis. Biopsied.                    - Normal examined duodenum. Recommendation:    - Await pathology results.                    - Perform a colonoscopy today. Procedure Code(s): --- Professional ---                    [phone], Esophagogastroduodenoscopy, flexible, transoral;                     with biopsy, single or multiple Diagnosis Code(s): --- Professional ---                    R11.0, Nausea                    K29.70, Gastritis, unspecified, without bleeding CPT copyright 2014 American Medical Association. All rights reserved. The codes documented in this report are preliminary and upon coder review may  be revised to meet current compliance requirements. Midge Minium, MD 03/20/2015 8:03:42 AM This report has been signed electronically. Number of Addenda: 0 Note Initiated On: 03/20/2015 7:55 AM      [facility]

## 2015-03-20 NOTE — Transfer of Care (Signed)
Immediate Anesthesia Transfer of Care Note  Patient: Dana Weber  Procedure(s) Performed: Procedure(s) with comments: COLONOSCOPY WITH PROPOFOL (N/A) ESOPHAGOGASTRODUODENOSCOPY (EGD) WITH PROPOFOL (N/A) - Diabetic - oral meds  Patient Location: PACU  Anesthesia Type: MAC  Level of Consciousness: awake, alert  and patient cooperative  Airway and Oxygen Therapy: Patient Spontanous Breathing and Patient connected to supplemental oxygen  Post-op Assessment: Post-op Vital signs reviewed, Patient's Cardiovascular Status Stable, Respiratory Function Stable, Patent Airway and No signs of Nausea or vomiting  Post-op Vital Signs: Reviewed and stable  Complications: No apparent anesthesia complications

## 2015-03-20 NOTE — Anesthesia Postprocedure Evaluation (Signed)
Anesthesia Post Note  Patient: Dana Weber  Procedure(s) Performed: Procedure(s) (LRB): COLONOSCOPY WITH PROPOFOL (N/A) ESOPHAGOGASTRODUODENOSCOPY (EGD) WITH PROPOFOL (N/A)  Patient location during evaluation: PACU Anesthesia Type: MAC Level of consciousness: awake and alert and oriented Pain management: pain level controlled Vital Signs Assessment: post-procedure vital signs reviewed and stable Respiratory status: spontaneous breathing Cardiovascular status: stable Postop Assessment: no headache and no signs of nausea or vomiting Anesthetic complications: no    Harolyn RutherfordJoshua Sire Poet

## 2015-03-20 NOTE — Op Note (Signed)
[facility] Gastroenterology Patient Name: Dana MurrayMarcia Odowd Procedure Date: 03/20/2015 8:03 AM MRN: [id number] Account #: [id number] Date of Birth: 07/05/1960 Admit Type: Outpatient Age: [age] [facility] Gender: Female Note Status: Finalized Procedure:         Colonoscopy Indications:       High risk colon cancer surveillance: Personal history of                     colonic polyps, Incidental - Chronic diarrhea Providers:         Midge Miniumarren Kenzie Flakes, MD Referring MD:      Demetrios Isaacsonald E. Sherrie MustacheFisher, MD (Referring MD) Medicines:         Propofol per Anesthesia Complications:     No immediate complications. Procedure:         Pre-Anesthesia Assessment:                    - Prior to the procedure, a History and Physical was                     performed, and patient medications and allergies were                     reviewed. The patient's tolerance of previous anesthesia                     was also reviewed. The risks and benefits of the procedure                     and the sedation options and risks were discussed with the                     patient. All questions were answered, and informed consent                     was obtained. Prior Anticoagulants: The patient has taken                     no previous anticoagulant or antiplatelet agents. ASA                     Grade Assessment: II - A patient with mild systemic                     disease. After reviewing the risks and benefits, the                     patient was deemed in satisfactory condition to undergo                     the procedure.                    After obtaining informed consent, the colonoscope was                     passed under direct vision. Throughout the procedure, the                     patient's blood pressure, pulse, and oxygen saturations                     were monitored continuously. The Olympus CF-HQ190L  Colonoscope (S#. [id number]) was introduced through the anus               and advanced to the the cecum, identified by appendiceal                     orifice and ileocecal valve. The colonoscopy was performed                     without difficulty. The patient tolerated the procedure                     well. The quality of the bowel preparation was excellent. Findings:      The perianal and digital rectal examinations were normal.      The terminal ileum appeared normal. Biopsies were taken with a cold       forceps for histology.      A few small-mouthed diverticula were found in the entire colon.      Non-bleeding internal hemorrhoids were found during retroflexion. The       hemorrhoids were Grade I (internal hemorrhoids that do not prolapse).      Random biopsies were obtained with cold forceps for histology randomly       in the entire colon. Impression:        - The examined portion of the ileum was normal. Biopsied.                    - Diverticulosis in the entire examined colon.                    - Non-bleeding internal hemorrhoids.                    - Random biopsies were obtained in the entire colon. Recommendation:    - Await pathology results. Procedure Code(s): --- Professional ---                    [phone], Colonoscopy, flexible; with biopsy, single or                     multiple Diagnosis Code(s): --- Professional ---                    Z86.010, Personal history of colonic polyps CPT copyright 2014 American Medical Association. All rights reserved. The codes documented in this report are preliminary and upon coder review may  be revised to meet current compliance requirements. Midge Minium, MD 03/20/2015 8:16:48 AM This report has been signed electronically. Number of Addenda: 0 Note Initiated On: 03/20/2015 8:03 AM Scope Withdrawal Time: 0 hours 7 minutes 6 seconds  Total Procedure Duration: 0 hours 8 minutes 50 seconds       [facility]

## 2015-03-20 NOTE — Anesthesia Preprocedure Evaluation (Signed)
Anesthesia Evaluation  Patient identified by MRN, date of birth, ID band Patient awake    Reviewed: Allergy & Precautions, NPO status , Patient's Chart, lab work & pertinent test results, reviewed documented beta blocker date and time   History of Anesthesia Complications Negative for: history of anesthetic complications  Airway Mallampati: II  TM Distance: >3 FB     Dental  (+) Partial Lower   Pulmonary Current Smoker,    Pulmonary exam normal        Cardiovascular hypertension, Normal cardiovascular exam     Neuro/Psych  Headaches, PSYCHIATRIC DISORDERS Anxiety Depression    GI/Hepatic   Endo/Other  diabetes, Type 2  Renal/GU      Musculoskeletal  (+) Arthritis ,   Abdominal   Peds  Hematology   Anesthesia Other Findings   Reproductive/Obstetrics                             Anesthesia Physical Anesthesia Plan  ASA: II  Anesthesia Plan: MAC   Post-op Pain Management:    Induction: Intravenous  Airway Management Planned:   Additional Equipment:   Intra-op Plan:   Post-operative Plan:   Informed Consent: I have reviewed the patients History and Physical, chart, labs and discussed the procedure including the risks, benefits and alternatives for the proposed anesthesia with the patient or authorized representative who has indicated his/her understanding and acceptance.     Plan Discussed with: CRNA  Anesthesia Plan Comments:         Anesthesia Quick Evaluation

## 2015-03-21 ENCOUNTER — Encounter: Payer: Self-pay | Admitting: Gastroenterology

## 2015-03-26 ENCOUNTER — Encounter: Payer: Self-pay | Admitting: Family Medicine

## 2015-03-26 ENCOUNTER — Telehealth: Payer: Self-pay

## 2015-03-26 NOTE — Telephone Encounter (Signed)
-----   Message from [name], MD sent at 03/25/2015  9:45 AM EST ----- Let the patient know that the biopsies did not show any cause for her symptoms. If she is still having the symptoms please have her common and see me in the office.

## 2015-03-26 NOTE — Telephone Encounter (Signed)
LVM with pt results. Advised to call and schedule appt if pt is still having symptoms.

## 2015-04-17 ENCOUNTER — Other Ambulatory Visit: Payer: Self-pay | Admitting: Family Medicine

## 2015-05-02 ENCOUNTER — Other Ambulatory Visit: Payer: Self-pay | Admitting: Family Medicine

## 2015-06-24 ENCOUNTER — Encounter: Payer: Self-pay | Admitting: Family Medicine

## 2015-07-14 ENCOUNTER — Other Ambulatory Visit: Payer: Self-pay | Admitting: Family Medicine

## 2015-07-22 ENCOUNTER — Encounter: Payer: Self-pay | Admitting: Family Medicine

## 2015-08-10 ENCOUNTER — Other Ambulatory Visit: Payer: Self-pay | Admitting: Family Medicine

## 2015-08-15 ENCOUNTER — Ambulatory Visit (INDEPENDENT_AMBULATORY_CARE_PROVIDER_SITE_OTHER): Payer: BC Managed Care – PPO | Admitting: Family Medicine

## 2015-08-15 ENCOUNTER — Encounter: Payer: Self-pay | Admitting: Family Medicine

## 2015-08-15 VITALS — BP 132/70 | Temp 98.7°F | Resp 16 | Ht 60.0 in | Wt 130.0 lb

## 2015-08-15 DIAGNOSIS — I10 Essential (primary) hypertension: Secondary | ICD-10-CM

## 2015-08-15 DIAGNOSIS — E785 Hyperlipidemia, unspecified: Secondary | ICD-10-CM | POA: Diagnosis not present

## 2015-08-15 DIAGNOSIS — E1100 Type 2 diabetes mellitus with hyperosmolarity without nonketotic hyperglycemic-hyperosmolar coma (NKHHC): Secondary | ICD-10-CM | POA: Diagnosis not present

## 2015-08-15 MED ORDER — SITAGLIPTIN PHOSPHATE 100 MG PO TABS: 100.0000 mg | ORAL_TABLET | Freq: Every day | ORAL | Status: DC

## 2015-08-15 NOTE — Progress Notes (Signed)
Patient ID: Dana Weber, female   DOB: [birth year], 55 y.o.   MRN: [id number]       Patient: Dana Weber Female    DOB: Jan 11, 1961   55 y.o.   MRN: [id number] Visit Date: 08/15/2015  Today's Provider: Dortha Kern, PA   No chief complaint on file.  Subjective:    HPI  Diabetes Mellitus Type II, Follow-up:   Lab Results  Component Value Date   HGBA1C 8.7* 12/12/2014   Last seen for diabetes 8 months ago.  Management since then includes adding invokana. Patient reports that she stopped taking it because of the side effects you could develop.  She reports good compliance with treatment. She is not having side effects.  Current symptoms include none and have been stable. Home blood sugar records: Patient reports that her blood sugars are "high readings".   Episodes of hypoglycemia? no   Current Insulin Regimen: none Most Recent Eye Exam: 2016 Weight trend: stable Prior visit with dietician: no Current diet: well balanced Current exercise: walking  ------------------------------------------------------------------------   Hypertension, follow-up:  BP Readings from Last 3 Encounters:  08/15/15 132/70  03/20/15 116/79  03/03/15 148/86    She was last seen for hypertension 8 months ago.  BP at that visit was 118/72. Management since that visit includes no change.She reports good compliance with treatment. She is not having side effects.  She is exercising. She is adherent to low salt diet.   Outside blood pressures are not being checked. She is experiencing none.  Patient denies none.   Cardiovascular risk factors include diabetes mellitus.   ------------------------------------------------------------------------    Lipid/Cholesterol, Follow-up:   Last seen for this 8 months ago.  Management since that visit includes no changes.  Last Lipid Panel:    Component Value Date/Time   CHOL 170 12/12/2014 1029   TRIG 184* 12/12/2014 1029   HDL  41 12/12/2014 1029   CHOLHDL 4.1 12/12/2014 1029   LDLCALC 92 12/12/2014 1029    She reports good compliance with treatment. She is not having side effects.   Wt Readings from Last 3 Encounters:  08/15/15 130 lb (58.968 kg)  03/20/15 124 lb (56.246 kg)  03/03/15 132 lb (59.875 kg)    ------------------------------------------------------------------------  Past Medical History  Diagnosis Date  . Hypertension   . Hyperlipidemia   . Fatty liver   . Diabetes mellitus without complication (HCC)   . Headache     sinus  . Multilevel degenerative disc disease     "spurs" on spine also  . Vertigo     mild - daily - positional  . Wears dentures     partial lower   Past Surgical History  Procedure Laterality Date  . Abdominal hysterectomy    . Cholecystectomy    . Cesarean section    . Tonsillectomy and adenoidectomy  1960  . Hemorrhoid surgery    . Dilation and curettage of uterus    . Colonoscopy with propofol N/A 03/20/2015    Procedure: COLONOSCOPY WITH PROPOFOL;  Surgeon: Midge Minium, MD;  Location: [facility];  Service: Endoscopy;  Laterality: N/A;  . Esophagogastroduodenoscopy (egd) with propofol N/A 03/20/2015    Procedure: ESOPHAGOGASTRODUODENOSCOPY (EGD) WITH PROPOFOL;  Surgeon: Midge Minium, MD;  Location: [facility];  Service: Endoscopy;  Laterality: N/A;  Diabetic - oral meds   Family History  Problem Relation Age of Onset  . Diabetes Mother   . Hypertension Mother   . Diabetes Father   . Hypertension  Father   . Hypertension Brother   . Diabetes Paternal Grandmother   . Emphysema Paternal Grandfather   . Hypertension Brother    Allergies  Allergen Reactions  . Amoxicillin Swelling    Facial, and itching  . Hydrocodone Bitartrate Er Swelling   Previous Medications   ALBUTEROL (PROVENTIL HFA) 108 (90 BASE) MCG/ACT INHALER    Inhale 2 puffs into the lungs every 6 (six) hours as needed for wheezing or shortness of breath.   B COMPLEX  VITAMINS PO    Take 1 tablet by mouth daily.   BENAZEPRIL-HYDROCHLORTHIAZIDE (LOTENSIN HCT) 10-12.5 MG TABLET    TAKE 1 TABLET BY MOUTH DAILY.   CANAGLIFLOZIN (INVOKANA) 100 MG TABS TABLET    Take 1 tablet (100 mg total) by mouth daily.   CHANTIX CONTINUING MONTH PAK 1 MG TABLET       FLUTICASONE (FLONASE) 50 MCG/ACT NASAL SPRAY    Place 2 sprays into the nose daily.   JANUVIA 100 MG TABLET    TAKE 1 TABLET BY MOUTH EVERY DAY.*PATIENT NEEDS TO SCHEDULE OFFICE VISIT FOR FOLLOW UP*   LOVASTATIN (MEVACOR) 20 MG TABLET    TAKE 1 TABLET BY MOUTH EVERY DAY   MELOXICAM (MOBIC) 15 MG TABLET    Take 1 tablet by mouth daily. Reported on 08/15/2015   METFORMIN (GLUCOPHAGE-XR) 500 MG 24 HR TABLET    TAKE 2 TABLETS BY MOUTH TWICE A DAY   PAROXETINE (PAXIL) 20 MG TABLET    TAKE 1 TABLET BY MOUTH DAILY    Review of Systems  Constitutional: Negative.   Respiratory: Negative.   Cardiovascular: Negative.   Neurological: Positive for headaches.  Psychiatric/Behavioral: Negative.     Social History  Substance Use Topics  . Smoking status: Current Every Day Smoker -- 0.25 packs/day for 30 years    Types: Cigarettes  . Smokeless tobacco: Never Used     Comment: trying to quit by end of 2016  . Alcohol Use: 0.0 oz/week    0 Standard drinks or equivalent per week     Comment: OCCASIONALLY   Objective:   BP 132/70 mmHg  Temp(Src) 98.7 F (37.1 C)  Resp 16  Ht 5' (1.524 m)  Wt 130 lb (58.968 kg)  BMI 25.39 kg/m2  Physical Exam  Constitutional: She is oriented to person, place, and time. She appears well-developed and well-nourished. No distress.  HENT:  Head: Normocephalic and atraumatic.  Right Ear: Hearing normal.  Left Ear: Hearing normal.  Nose: Nose normal.  Eyes: Conjunctivae and lids are normal. Right eye exhibits no discharge. Left eye exhibits no discharge. No scleral icterus.  Neck: Normal range of motion. Neck supple.  Cardiovascular: Normal rate and regular rhythm.     Pulmonary/Chest: Effort normal and breath sounds normal. No respiratory distress.  Abdominal: Soft. Bowel sounds are normal.  Musculoskeletal: Normal range of motion.  Neurological: She is alert and oriented to person, place, and time.  Skin: Skin is intact. No lesion and no rash noted.  Psychiatric: She has a normal mood and affect. Her speech is normal and behavior is normal. Thought content normal.      Assessment & Plan:     1. Uncontrolled type 2 diabetes mellitus with hyperosmolarity without coma, without long-term current use of insulin (HCC) Did not like the way the Invokana made her "feel". Urinalysis essentially normal today. Tolerating Januvia and Metformin. FBS around 130-160. Will recheck labs and continue diabetic diet. Recheck prn. - Comprehensive metabolic panel - Hemoglobin A1c -  sitaGLIPtin (JANUVIA) 100 MG tablet; Take 1 tablet (100 mg total) by mouth daily.  Dispense: 90 tablet; Refill: 3  2. Essential (primary) hypertension Well controlled on the Benazepril/HCTZ 10-12.5 mg qd. No side effects. Recheck labs and follow up pending reports. - CBC with Differential/Platelet  3. HLD (hyperlipidemia) Still taking Lovastatin 20 mg qd and trying to lower fats in diet. Will recheck CMP and lipids. Notices some fatigue but no specific muscle or joint discomfort. - Comprehensive metabolic panel - Lipid panel       Dortha Kern, PA  [facility]

## 2015-08-16 LAB — LIPID PANEL
CHOL/HDL RATIO: 4 ratio (ref 0.0–4.4)
Cholesterol, Total: 185 mg/dL (ref 100–199)
HDL: 46 mg/dL (ref >39)
LDL CALC: 109 mg/dL — AB (ref 0–99)
Triglycerides: 150 mg/dL — ABNORMAL HIGH (ref 0–149)
VLDL CHOLESTEROL CAL: 30 mg/dL (ref 5–40)

## 2015-08-16 LAB — CBC WITH DIFFERENTIAL/PLATELET
BASOS ABS: 0.1 10*3/uL (ref 0.0–0.2)
Basos: 1 %
EOS (ABSOLUTE): 0.1 10*3/uL (ref 0.0–0.4)
EOS: 2 %
HEMATOCRIT: 40.4 % (ref 34.0–46.6)
HEMOGLOBIN: 13.4 g/dL (ref 11.1–15.9)
IMMATURE GRANULOCYTES: 0 %
Immature Grans (Abs): 0 10*3/uL (ref 0.0–0.1)
Lymphocytes Absolute: 1.8 10*3/uL (ref 0.7–3.1)
Lymphs: 30 %
MCH: 30 pg (ref 26.6–33.0)
MCHC: 33.2 g/dL (ref 31.5–35.7)
MCV: 91 fL (ref 79–97)
MONOCYTES: 8 %
MONOS ABS: 0.5 10*3/uL (ref 0.1–0.9)
NEUTROS PCT: 59 %
Neutrophils Absolute: 3.6 10*3/uL (ref 1.4–7.0)
Platelets: 316 10*3/uL (ref 150–379)
RBC: 4.46 x10E6/uL (ref 3.77–5.28)
RDW: 13.3 % (ref 12.3–15.4)
WBC: 6.1 10*3/uL (ref 3.4–10.8)

## 2015-08-16 LAB — HEMOGLOBIN A1C
Est. average glucose Bld gHb Est-mCnc: 192 mg/dL
HEMOGLOBIN A1C: 8.3 % — AB (ref 4.8–5.6)

## 2015-08-16 LAB — COMPREHENSIVE METABOLIC PANEL
ALT: 35 IU/L — ABNORMAL HIGH (ref 0–32)
AST: 23 IU/L (ref 0–40)
Albumin/Globulin Ratio: 2.2 (ref 1.2–2.2)
Albumin: 4.4 g/dL (ref 3.5–5.5)
Alkaline Phosphatase: 74 IU/L (ref 39–117)
BUN/Creatinine Ratio: 16 (ref 9–23)
BUN: 10 mg/dL (ref 6–24)
Bilirubin Total: 0.3 mg/dL (ref 0.0–1.2)
CALCIUM: 9.5 mg/dL (ref 8.7–10.2)
CHLORIDE: 98 mmol/L (ref 96–106)
CO2: 27 mmol/L (ref 18–29)
CREATININE: 0.64 mg/dL (ref 0.57–1.00)
GFR calc Af Amer: 117 mL/min/{1.73_m2} (ref >59)
GFR calc non Af Amer: 101 mL/min/{1.73_m2} (ref >59)
GLOBULIN, TOTAL: 2 g/dL (ref 1.5–4.5)
GLUCOSE: 178 mg/dL — AB (ref 65–99)
Potassium: 4.6 mmol/L (ref 3.5–5.2)
SODIUM: 143 mmol/L (ref 134–144)
Total Protein: 6.4 g/dL (ref 6.0–8.5)

## 2015-08-21 ENCOUNTER — Telehealth: Payer: Self-pay | Admitting: Family Medicine

## 2015-08-21 NOTE — Telephone Encounter (Signed)
Pt was in last week and had labs done.  She has not received results back yet.  Please call her at [phone]  Thanks, Barth Kirksteri

## 2015-08-22 NOTE — Telephone Encounter (Signed)
Dana Weber is not here this week to review labs. Her a1c was a little better from 8.7 to 8.3. LDL cholesterol is too high at 109 and should be under 70. She should here back next week from [place] to discuss possible medication changes.

## 2015-08-22 NOTE — Telephone Encounter (Signed)
Please review. Thanks!  

## 2015-08-22 NOTE — Telephone Encounter (Signed)
[id number] 

## 2015-08-22 NOTE — Telephone Encounter (Signed)
Pt advised, please review, pt was not happy that she has to wait so long to get her lab results-aa

## 2015-08-25 ENCOUNTER — Telehealth: Payer: Self-pay

## 2015-08-25 NOTE — Telephone Encounter (Signed)
Apologized for the delay in getting this lab report to her and she seemed satisfied. Discussed lab findings and she decided to go back on the Invokana with the Januvia and Metformin. Admits to being off her Lovastatin recently and wants to try this more regularly with low fat diet for the next 3 months. She will check BS at home and let us know the trend then recheck Hgb A1C and lipids in 3 months.

## 2015-08-25 NOTE — Telephone Encounter (Signed)
Left message to call back  

## 2015-08-25 NOTE — Telephone Encounter (Signed)
Pt is returning call.  CB#[phone].  Pt request a call after 3:30/MW

## 2015-08-25 NOTE — Telephone Encounter (Signed)
-----   Message from Tamsen Roersennis E Chrismon, [place] sent at 08/24/2015 10:57 PM EDT ----- Glucose high the day blood was drawn but the Hgb A1C was better than 8 months ago. Triglycerides better but borderline. LDL cholesterol is slightly above 100 and higher than 8 months ago. Recommend continuing Januvia and Metformin. Could try Welchol to help LDL and glucose or may need to consider basal insulin once a day.

## 2015-08-27 DIAGNOSIS — N281 Cyst of kidney, acquired: Secondary | ICD-10-CM | POA: Insufficient documentation

## 2015-09-04 ENCOUNTER — Other Ambulatory Visit: Payer: Self-pay | Admitting: Family Medicine

## 2015-09-25 ENCOUNTER — Other Ambulatory Visit: Payer: Self-pay

## 2015-09-25 DIAGNOSIS — E1100 Type 2 diabetes mellitus with hyperosmolarity without nonketotic hyperglycemic-hyperosmolar coma (NKHHC): Secondary | ICD-10-CM

## 2015-09-25 NOTE — Telephone Encounter (Signed)
Pt is returning call.  CB#[phone]/MW

## 2015-09-29 NOTE — Telephone Encounter (Signed)
Pt called to see what the status on the PA for canagliflozin (INVOKANA) 100 MG TABS tablet. Pt stated that she doesn't have this medication and wanted to see what she needs to do during the process of the PA. Pt stated she was advised the pharmacy sent the PA information to our office last week. Please advise. Thanks TNP

## 2015-10-01 ENCOUNTER — Other Ambulatory Visit: Payer: Self-pay

## 2015-10-02 ENCOUNTER — Other Ambulatory Visit: Payer: Self-pay

## 2015-10-02 DIAGNOSIS — E1100 Type 2 diabetes mellitus with hyperosmolarity without nonketotic hyperglycemic-hyperosmolar coma (NKHHC): Secondary | ICD-10-CM

## 2015-10-02 MED ORDER — CANAGLIFLOZIN 100 MG PO TABS: 100.0000 mg | ORAL_TABLET | Freq: Every day | ORAL | Status: DC

## 2015-10-02 NOTE — Telephone Encounter (Signed)
Pharmacy sent approval for medication. Could you please resend into pharmacy? Thanks!

## 2015-10-02 NOTE — Telephone Encounter (Signed)
Medication has been approved today. Patient was advised. Another Rx was sent into the pharmacy.

## 2015-10-06 ENCOUNTER — Encounter: Payer: Self-pay | Admitting: Family Medicine

## 2015-10-21 ENCOUNTER — Ambulatory Visit (INDEPENDENT_AMBULATORY_CARE_PROVIDER_SITE_OTHER): Payer: BC Managed Care – PPO | Admitting: Family Medicine

## 2015-10-21 ENCOUNTER — Encounter: Payer: Self-pay | Admitting: Family Medicine

## 2015-10-21 VITALS — BP 104/62 | HR 80 | Temp 99.1°F | Resp 16 | Ht 60.0 in | Wt 121.0 lb

## 2015-10-21 DIAGNOSIS — E1100 Type 2 diabetes mellitus with hyperosmolarity without nonketotic hyperglycemic-hyperosmolar coma (NKHHC): Secondary | ICD-10-CM | POA: Diagnosis not present

## 2015-10-21 DIAGNOSIS — R509 Fever, unspecified: Secondary | ICD-10-CM

## 2015-10-21 DIAGNOSIS — R634 Abnormal weight loss: Secondary | ICD-10-CM

## 2015-10-21 DIAGNOSIS — I10 Essential (primary) hypertension: Secondary | ICD-10-CM | POA: Diagnosis not present

## 2015-10-21 MED ORDER — DAPAGLIFLOZIN PROPANEDIOL 5 MG PO TABS: 5.0000 mg | ORAL_TABLET | Freq: Every day | ORAL | 0 refills | Status: DC

## 2015-10-21 NOTE — Progress Notes (Signed)
Patient: Dana Weber Female    DOB: 08/01/1960   55 y.o.   MRN: [id number] Visit Date: 10/21/2015  Today's Provider: Dortha Kern, PA   Chief Complaint  Patient presents with  . Back Pain  . Weight Loss    unintentional    Subjective:    HPI Patient comes in today for constant back pain that is located on her upper left side. She reports that it has had pain for over 1 month, and it was a constant dull ache. No recurrence recently.  Patient also mentions that she has had unintentional weight loss since her last office visit. Patient reports that her appetite has been a little less, but she has not been dieting.   Patient also wants to discuss Invokana with the provider. She reports that her insurance told her that she must try a "preferred" medication before taking Invokana. Patient wants to change medication because it will cost her $400 out of pocket for a month supply.    Past Medical History:  Diagnosis Date  . Diabetes mellitus without complication (HCC)   . Fatty liver   . Headache    sinus  . Hyperlipidemia   . Hypertension   . Multilevel degenerative disc disease    "spurs" on spine also  . Vertigo    mild - daily - positional  . Wears dentures    partial lower   Past Surgical History:  Procedure Laterality Date  . ABDOMINAL HYSTERECTOMY    . CESAREAN SECTION    . CHOLECYSTECTOMY    . COLONOSCOPY WITH PROPOFOL N/A 03/20/2015   Procedure: COLONOSCOPY WITH PROPOFOL;  Surgeon: Midge Minium, MD;  Location: [facility];  Service: Endoscopy;  Laterality: N/A;  . DILATION AND CURETTAGE OF UTERUS    . ESOPHAGOGASTRODUODENOSCOPY (EGD) WITH PROPOFOL N/A 03/20/2015   Procedure: ESOPHAGOGASTRODUODENOSCOPY (EGD) WITH PROPOFOL;  Surgeon: Midge Minium, MD;  Location: [facility];  Service: Endoscopy;  Laterality: N/A;  Diabetic - oral meds  . HEMORRHOID SURGERY    . TONSILLECTOMY AND ADENOIDECTOMY  1960   Family History  Problem Relation Age  of Onset  . Diabetes Mother   . Hypertension Mother   . Diabetes Father   . Hypertension Father   . Hypertension Brother   . Diabetes Paternal Grandmother   . Emphysema Paternal Grandfather   . Hypertension Brother    Allergies  Allergen Reactions  . Amoxicillin Swelling    Facial, and itching  . Hydrocodone Bitartrate Er Swelling   Current Meds  Medication Sig  . albuterol (PROVENTIL HFA) 108 (90 BASE) MCG/ACT inhaler Inhale 2 puffs into the lungs every 6 (six) hours as needed for wheezing or shortness of breath.  . B COMPLEX VITAMINS PO Take 1 tablet by mouth daily.  . benazepril-hydrochlorthiazide (LOTENSIN HCT) 10-12.5 MG tablet TAKE 1 TABLET BY MOUTH DAILY.  . canagliflozin (INVOKANA) 100 MG TABS tablet Take 1 tablet (100 mg total) by mouth daily.  . fluticasone (FLONASE) 50 MCG/ACT nasal spray Place 2 sprays into the nose daily.  Marland Kitchen lovastatin (MEVACOR) 20 MG tablet TAKE 1 TABLET BY MOUTH EVERY DAY  . meloxicam (MOBIC) 15 MG tablet Take 1 tablet by mouth daily. Reported on 08/15/2015  . metFORMIN (GLUCOPHAGE-XR) 500 MG 24 hr tablet TAKE 2 TABLETS BY MOUTH TWICE A DAY  . PARoxetine (PAXIL) 20 MG tablet TAKE 1 TABLET BY MOUTH DAILY  . sitaGLIPtin (JANUVIA) 100 MG tablet Take 1 tablet (100 mg total) by  mouth daily.    Review of Systems  Constitutional: Positive for activity change, fatigue, fever and unexpected weight change. Negative for appetite change.       Has had fevers off and on for the last week. Has been up to 100.1. Mainly at night.   Respiratory: Negative.   Cardiovascular: Negative.   Gastrointestinal: Negative.   Genitourinary: Negative for difficulty urinating, dysuria, flank pain and frequency.  Musculoskeletal: Positive for back pain.  Skin: Negative.   Neurological: Negative.     Social History  Substance Use Topics  . Smoking status: Current Every Day Smoker    Packs/day: 0.25    Years: 30.00    Types: Cigarettes  . Smokeless tobacco: Never Used      Comment: trying to quit by end of 2016  . Alcohol use 0.0 oz/week     Comment: OCCASIONALLY   Objective:   BP 104/62 (BP Location: Right Arm, Patient Position: Sitting, Cuff Size: Normal)   Pulse 80   Temp 99.1 F (37.3 C)   Resp 16   Ht 5' (1.524 m)   Wt 121 lb (54.9 kg)   BMI 23.63 kg/m  Wt Readings from Last 3 Encounters:  10/21/15 121 lb (54.9 kg)  08/15/15 130 lb (59 kg)  03/20/15 124 lb (56.2 kg)    Physical Exam  Constitutional: She is oriented to person, place, and time. She appears well-developed and well-nourished.  HENT:  Head: Normocephalic.  Right Ear: External ear normal.  Left Ear: External ear normal.  Nose: Nose normal.  Mouth/Throat: Oropharynx is clear and moist.  Eyes: Conjunctivae and EOM are normal.  Neck: Normal range of motion. Neck supple. No thyromegaly present.  Cardiovascular: Normal rate, regular rhythm and normal heart sounds.   Pulmonary/Chest: Effort normal and breath sounds normal.  Abdominal: Bowel sounds are normal.  Musculoskeletal: Normal range of motion. She exhibits no edema.  Lymphadenopathy:    She has no cervical adenopathy.  Neurological: She is alert and oriented to person, place, and time. She has normal reflexes.  Skin: No rash noted.  Psychiatric: She has a normal mood and affect. Her behavior is normal. Thought content normal.      Assessment & Plan:     1. Fever, unspecified fever cause Has had elevation of temperature at night as high as 100.1. Denies cough, congestion, dysuria or GI upset. Some body aches, fatigue and back pain. Urinalysis essentially normal except glucosuria with use of Invokana. Will get labs and encouraged to drink extra fluids. May use Tylenol or Advil prn aches, pains or fever. - CBC with Differential/Platelet - Antistreptolysin O titer - POCT urinalysis dipstick  2. Weight loss Has lost approximately 9 lbs since 08-15-15. No appetite loss, diarrhea, melena or hematemesis. No nausea or vomiting.  Check CMP and thyroid function - Comprehensive metabolic panel - TSH  3. Uncontrolled type 2 diabetes mellitus with hyperosmolarity without coma, without long-term current use of insulin (HCC) Insurance coverage for Invokana is inadequate to be able to afford it. Will try Marcelline Deist and continue to check FBS daily. Follow diabetic diet and recheck CMP.  - dapagliflozin propanediol (FARXIGA) 5 MG TABS tablet; Take 5 mg by mouth daily.  Dispense: 28 tablet; Refill: 0 - Comprehensive metabolic panel - POCT urinalysis dipstick  4. Essential (primary) hypertension Good control of BP with use of Lotensin-HCT 10-12.5 mg qd. Will monitor for changes with change to [place]. Recheck pending lab reports. - dapagliflozin propanediol (FARXIGA) 5 MG TABS tablet; Take 5  mg by mouth daily.  Dispense: 28 tablet; Refill: 0 - POCT urinalysis dipstick       Dortha Kern, PA  [facility]

## 2015-10-23 ENCOUNTER — Other Ambulatory Visit: Payer: Self-pay | Admitting: Family Medicine

## 2015-10-24 LAB — POCT URINALYSIS DIPSTICK
BILIRUBIN UA: NEGATIVE
Glucose, UA: 2000
Ketones, UA: NEGATIVE
Leukocytes, UA: NEGATIVE
NITRITE UA: NEGATIVE
PH UA: 6
PROTEIN UA: NEGATIVE
RBC UA: NEGATIVE
Spec Grav, UA: <=1.005
UROBILINOGEN UA: 0.2

## 2015-10-25 LAB — COMPREHENSIVE METABOLIC PANEL
A/G RATIO: 1.9 (ref 1.2–2.2)
ALT: 25 IU/L (ref 0–32)
AST: 21 IU/L (ref 0–40)
Albumin: 4.3 g/dL (ref 3.5–5.5)
Alkaline Phosphatase: 71 IU/L (ref 39–117)
BUN / CREAT RATIO: 16 (ref 9–23)
BUN: 10 mg/dL (ref 6–24)
Bilirubin Total: 0.2 mg/dL (ref 0.0–1.2)
CALCIUM: 9.1 mg/dL (ref 8.7–10.2)
CO2: 24 mmol/L (ref 18–29)
Chloride: 97 mmol/L (ref 96–106)
Creatinine, Ser: 0.61 mg/dL (ref 0.57–1.00)
GFR calc Af Amer: 119 mL/min/{1.73_m2} (ref >59)
GFR, EST NON AFRICAN AMERICAN: 103 mL/min/{1.73_m2} (ref >59)
GLOBULIN, TOTAL: 2.3 g/dL (ref 1.5–4.5)
Glucose: 184 mg/dL — ABNORMAL HIGH (ref 65–99)
POTASSIUM: 4.3 mmol/L (ref 3.5–5.2)
SODIUM: 137 mmol/L (ref 134–144)
Total Protein: 6.6 g/dL (ref 6.0–8.5)

## 2015-10-25 LAB — CBC WITH DIFFERENTIAL/PLATELET
BASOS: 1 %
Basophils Absolute: 0.1 10*3/uL (ref 0.0–0.2)
EOS (ABSOLUTE): 0.2 10*3/uL (ref 0.0–0.4)
Eos: 3 %
HEMATOCRIT: 37.3 % (ref 34.0–46.6)
Hemoglobin: 12.3 g/dL (ref 11.1–15.9)
IMMATURE GRANULOCYTES: 0 %
Immature Grans (Abs): 0 10*3/uL (ref 0.0–0.1)
LYMPHS: 31 %
Lymphocytes Absolute: 2.1 10*3/uL (ref 0.7–3.1)
MCH: 28.2 pg (ref 26.6–33.0)
MCHC: 33 g/dL (ref 31.5–35.7)
MCV: 86 fL (ref 79–97)
MONOCYTES: 8 %
Monocytes Absolute: 0.5 10*3/uL (ref 0.1–0.9)
NEUTROS PCT: 57 %
Neutrophils Absolute: 3.7 10*3/uL (ref 1.4–7.0)
Platelets: 435 10*3/uL — ABNORMAL HIGH (ref 150–379)
RBC: 4.36 x10E6/uL (ref 3.77–5.28)
RDW: 13.8 % (ref 12.3–15.4)
WBC: 6.6 10*3/uL (ref 3.4–10.8)

## 2015-10-25 LAB — ANTISTREPTOLYSIN O TITER: ASO: 27 [IU]/mL (ref 0.0–200.0)

## 2015-10-25 LAB — TSH: TSH: 1.02 u[IU]/mL (ref 0.450–4.500)

## 2015-10-27 ENCOUNTER — Telehealth: Payer: Self-pay

## 2015-10-27 NOTE — Telephone Encounter (Signed)
-----   Message from Jodell Ciproennis E Middle Friscohrismon, [place] sent at 10/27/2015  8:17 AM EDT ----- No sign of hidden strep infection, anemia or electrolyte imbalance. Normal kidney and liver function. Blood sugar higher than last check in May. Proceed with Marcelline DeistFarxiga daily and recheck progress in 3 months.

## 2015-10-27 NOTE — Telephone Encounter (Signed)
Patient advised as below. Patient reports still having symptoms and would like an MRI or CT scan to figure out what is causing symptoms. Patient did not want to explain what kind of symptoms she is still having. Patient reports that she has not started taking [place]iga reports that she will finish her invokana and then start [place]xiga. Patient also reports that she was not fasting the day she had blood drawn. Please advise. sd

## 2015-10-27 NOTE — Telephone Encounter (Signed)
LMTCB

## 2015-10-27 NOTE — Telephone Encounter (Signed)
-----   Message from Dennis E Chrismon, PA sent at 10/27/2015  8:17 AM EDT ----- No sign of hidden strep infection, anemia or electrolyte imbalance. Normal kidney and liver function. Blood sugar higher than last check in May. Proceed with Farxiga daily and recheck progress in 3 months. 

## 2015-10-28 ENCOUNTER — Telehealth: Payer: Self-pay | Admitting: Family Medicine

## 2015-10-28 ENCOUNTER — Encounter: Payer: Self-pay | Admitting: Family Medicine

## 2015-10-28 ENCOUNTER — Other Ambulatory Visit: Payer: Self-pay | Admitting: Family Medicine

## 2015-10-28 DIAGNOSIS — Z720 Tobacco use: Secondary | ICD-10-CM

## 2015-10-28 DIAGNOSIS — R634 Abnormal weight loss: Secondary | ICD-10-CM

## 2015-10-28 NOTE — Progress Notes (Deleted)
Before getting CT scan, need chest x-ray because of 30 year smoking habit and unexplained weight loss. Release future order when patient ready to go to outpatient x-ray.

## 2015-10-28 NOTE — Telephone Encounter (Signed)
Before getting CT scan, need chest x-ray because of 30 year smoking habit and unexplained weight loss. Release future order when patient ready to go to outpatient x-ray. 

## 2015-10-29 ENCOUNTER — Ambulatory Visit
Admission: RE | Admit: 2015-10-29 | Discharge: 2015-10-29 | Disposition: A | Discharge status: Home / Self Care | Payer: BC Managed Care – PPO | Source: Ambulatory Visit | Attending: Family Medicine | Admitting: Family Medicine

## 2015-10-29 DIAGNOSIS — R634 Abnormal weight loss: Secondary | ICD-10-CM | POA: Insufficient documentation

## 2015-10-29 DIAGNOSIS — F17200 Nicotine dependence, unspecified, uncomplicated: Secondary | ICD-10-CM | POA: Diagnosis present

## 2015-10-29 NOTE — Telephone Encounter (Signed)
Patient advised as below. Patient reports that she will have chest x-ray done today as recommended. sd

## 2015-10-30 ENCOUNTER — Other Ambulatory Visit: Payer: Self-pay | Admitting: Family Medicine

## 2015-11-27 ENCOUNTER — Other Ambulatory Visit: Payer: Self-pay | Admitting: Family Medicine

## 2015-11-27 DIAGNOSIS — I10 Essential (primary) hypertension: Secondary | ICD-10-CM

## 2015-11-27 DIAGNOSIS — E1100 Type 2 diabetes mellitus with hyperosmolarity without nonketotic hyperglycemic-hyperosmolar coma (NKHHC): Secondary | ICD-10-CM

## 2015-11-27 NOTE — Telephone Encounter (Signed)
Pt stated that when she was in the office on 10/21/15 and saw Maurine MinisterDennis she was given samples of dapagliflozin propanediol (FARXIGA) 5 MG TABS tablet. Pt stated the medication has worked well and would like [facility]. Please advise. Thanks TNP

## 2015-11-28 MED ORDER — DAPAGLIFLOZIN PROPANEDIOL 5 MG PO TABS: 5.0000 mg | ORAL_TABLET | Freq: Every day | ORAL | 1 refills | Status: DC

## 2016-03-12 ENCOUNTER — Encounter: Payer: Self-pay | Admitting: Family Medicine

## 2016-03-12 ENCOUNTER — Ambulatory Visit (INDEPENDENT_AMBULATORY_CARE_PROVIDER_SITE_OTHER): Payer: BC Managed Care – PPO | Admitting: Family Medicine

## 2016-03-12 VITALS — BP 132/72 | HR 90 | Temp 98.1°F | Resp 16 | Wt 119.0 lb

## 2016-03-12 DIAGNOSIS — J069 Acute upper respiratory infection, unspecified: Secondary | ICD-10-CM | POA: Diagnosis not present

## 2016-03-12 DIAGNOSIS — B9789 Other viral agents as the cause of diseases classified elsewhere: Secondary | ICD-10-CM | POA: Diagnosis not present

## 2016-03-12 MED ORDER — ALBUTEROL SULFATE HFA 108 (90 BASE) MCG/ACT IN AERS: INHALATION_SPRAY | RESPIRATORY_TRACT | 0 refills | Status: DC

## 2016-03-12 MED ORDER — DOXYCYCLINE HYCLATE 100 MG PO TABS: 100.0000 mg | ORAL_TABLET | Freq: Two times a day (BID) | ORAL | 0 refills | Status: DC

## 2016-03-12 MED ORDER — PREDNISONE 20 MG PO TABS: ORAL_TABLET | ORAL | 0 refills | Status: DC

## 2016-03-12 NOTE — Progress Notes (Signed)
Subjective:     Patient ID: Franchot MimesMarcia Jolayne Train, female   DOB: 08/19/1960, 55 y.o.   MRN: [id number]  HPI  Chief Complaint  Patient presents with  . URI    nasal and chest congestion  Developed cold sx over the last three days. Her chest has been tight so she has started using the albuterol inhaler received after a previous illness. She has attempted to decrease smoking while ill.   Review of Systems     Objective:   Physical Exam  Constitutional: She appears well-developed and well-nourished. No distress.  Ears: T.M's intact without inflammation; left TM is scarred. Throat: tonsils absent, no erythema Neck: no cervical adenopathy Lungs: clear     Assessment:    1. Viral upper respiratory tract infection - albuterol (PROAIR HFA) 108 (90 Base) MCG/ACT inhaler; INHALE 2 PUFFS INTO THE LUNGS EVERY 6 HOURS AS NEEDED FOR WHEEZING OR SHORTNESS OF BREATH  Dispense: 18 g; Refill: 0 - doxycycline (VIBRA-TABS) 100 MG tablet; Take 1 tablet (100 mg total) by mouth 2 (two) times daily.  Dispense: 20 tablet; Refill: 0 - predniSONE (DELTASONE) 20 MG tablet; One pill twice daily for 5 days  Dispense: 10 tablet; Refill: 0    Plan:    Discussed use of Mucinex D for congestion, Delsym for cough, and Benadryl for postnasal drainage.To start abx if sinuses not improving over the next few days. Prednisone for increased shortness of breath despite scheduled inhaler use.

## 2016-03-12 NOTE — Patient Instructions (Addendum)
Discussed use of Mucinex D for congestion, Delsym for cough, and Benadryl for postnasal drainage. Start the antibiotic if sinuses not improving over the next 4 days. Start the prednisone if you become more short of breath despite inhaler use.

## 2016-04-04 ENCOUNTER — Other Ambulatory Visit: Payer: Self-pay | Admitting: Family Medicine

## 2016-04-04 DIAGNOSIS — J069 Acute upper respiratory infection, unspecified: Secondary | ICD-10-CM

## 2016-04-05 NOTE — Telephone Encounter (Signed)
See refill request.

## 2016-04-20 ENCOUNTER — Other Ambulatory Visit: Payer: Self-pay | Admitting: Family Medicine

## 2016-04-27 ENCOUNTER — Telehealth: Payer: Self-pay | Admitting: Family Medicine

## 2016-04-27 NOTE — Telephone Encounter (Signed)
I would suggest recheck since the x-ray and discussion of possible ID referral was in August 2017 (over 6 months ago).

## 2016-04-27 NOTE — Telephone Encounter (Signed)
Pt stated that she and Nadine CountsBob had discussed a referral to infectious disease and she would like to go ahead with the referral. Please advise. Thanks TNP

## 2016-04-27 NOTE — Telephone Encounter (Signed)
I think you mentioned the I.D. Referral to her regarding recurring fever. See chest x-ray comments.

## 2016-04-27 NOTE — Telephone Encounter (Signed)
LMTCB to schedule appointment or to discuss further if wanted. Allene DillonEmily Drozdowski, CMA

## 2016-04-27 NOTE — Telephone Encounter (Signed)
Please have her follow up with Maurine Ministerennis for reasons he stated.

## 2016-04-27 NOTE — Telephone Encounter (Signed)
Please review. Carolin Quang Drozdowski, CMA  

## 2016-04-27 NOTE — Telephone Encounter (Signed)
Pt reports she did not want to be referred for fevers. She is experiencing nausea, diarrhea, abdominal pain. Denies vomiting, bloody or black/tarry stools. Fevers are no longer an issue. Appointment scheduled for 04/30/2016 at 1530. Allene DillonEmily Drozdowski, CMA

## 2016-04-30 ENCOUNTER — Ambulatory Visit (INDEPENDENT_AMBULATORY_CARE_PROVIDER_SITE_OTHER): Payer: BC Managed Care – PPO | Admitting: Family Medicine

## 2016-04-30 ENCOUNTER — Encounter: Payer: Self-pay | Admitting: Family Medicine

## 2016-04-30 VITALS — BP 128/72 | HR 94 | Temp 98.8°F | Resp 16 | Wt 119.4 lb

## 2016-04-30 DIAGNOSIS — R5383 Other fatigue: Secondary | ICD-10-CM | POA: Diagnosis not present

## 2016-04-30 DIAGNOSIS — E1100 Type 2 diabetes mellitus with hyperosmolarity without nonketotic hyperglycemic-hyperosmolar coma (NKHHC): Secondary | ICD-10-CM | POA: Diagnosis not present

## 2016-04-30 DIAGNOSIS — R5381 Other malaise: Secondary | ICD-10-CM

## 2016-04-30 DIAGNOSIS — I10 Essential (primary) hypertension: Secondary | ICD-10-CM | POA: Diagnosis not present

## 2016-04-30 DIAGNOSIS — K760 Fatty (change of) liver, not elsewhere classified: Secondary | ICD-10-CM

## 2016-04-30 DIAGNOSIS — R109 Unspecified abdominal pain: Secondary | ICD-10-CM | POA: Diagnosis not present

## 2016-04-30 DIAGNOSIS — N281 Cyst of kidney, acquired: Secondary | ICD-10-CM

## 2016-04-30 LAB — POCT GLYCOSYLATED HEMOGLOBIN (HGB A1C)

## 2016-04-30 LAB — HEMOGLOBIN A1C: Hemoglobin A1C: 7.5

## 2016-04-30 NOTE — Progress Notes (Signed)
Patient: Dana Weber Female    DOB: [birth year]   56 y.o.   MRN: [id number] Visit Date: 04/30/2016  Today's Provider: Dortha Kern, PA   Chief Complaint  Patient presents with  . Abdominal Pain   Subjective:    Abdominal Pain  This is a new problem. The current episode started 1 to 4 weeks ago. The problem occurs daily. The pain is located in the generalized abdominal region. Associated symptoms include arthralgias, diarrhea, headaches, myalgias and nausea.   Past Medical History:  Diagnosis Date  . Diabetes mellitus without complication (HCC)   . Fatty liver   . Headache    sinus  . Hyperlipidemia   . Hypertension   . Multilevel degenerative disc disease    "spurs" on spine also  . Vertigo    mild - daily - positional  . Wears dentures    partial lower   Patient Active Problem List   Diagnosis Date Noted  . Complex renal cyst 08/27/2015  . Personal history of colonic polyps   . Nausea   . Gastritis   . Fatty liver 12/12/2014  . Back pain, chronic 11/05/2014  . Type II diabetes mellitus, uncontrolled (HCC) 11/05/2014  . Arthritis of hand, degenerative 11/05/2014  . Bursitis, trochanteric 11/05/2014  . Current tobacco use 08/13/2014  . Compulsive tobacco user syndrome 10/29/2008  . Avitaminosis D 11/21/2007  . HLD (hyperlipidemia) 02/15/2006  . Menopausal symptom 02/15/2006  . Clinical depression 11/23/2002  . Essential (primary) hypertension 03/22/1998  . Episodic paroxysmal anxiety disorder 03/22/1998   Past Surgical History:  Procedure Laterality Date  . ABDOMINAL HYSTERECTOMY    . CESAREAN SECTION    . CHOLECYSTECTOMY    . COLONOSCOPY WITH PROPOFOL N/A 03/20/2015   Procedure: COLONOSCOPY WITH PROPOFOL;  Surgeon: Midge Minium, MD;  Location: [facility];  Service: Endoscopy;  Laterality: N/A;  . DILATION AND CURETTAGE OF UTERUS    . ESOPHAGOGASTRODUODENOSCOPY (EGD) WITH PROPOFOL N/A 03/20/2015   Procedure: ESOPHAGOGASTRODUODENOSCOPY (EGD)  WITH PROPOFOL;  Surgeon: Midge Minium, MD;  Location: [facility];  Service: Endoscopy;  Laterality: N/A;  Diabetic - oral meds  . HEMORRHOID SURGERY    . TONSILLECTOMY AND ADENOIDECTOMY  1960   Family History  Problem Relation Age of Onset  . Diabetes Mother   . Hypertension Mother   . Diabetes Father   . Hypertension Father   . Hypertension Brother   . Diabetes Paternal Grandmother   . Emphysema Paternal Grandfather   . Hypertension Brother    Allergies  Allergen Reactions  . Amoxicillin Swelling    Facial, and itching  . Hydrocodone Bitartrate Er Swelling     Previous Medications   BENAZEPRIL-HYDROCHLORTHIAZIDE (LOTENSIN HCT) 10-12.5 MG TABLET    TAKE 1 TABLET BY MOUTH DAILY.   DAPAGLIFLOZIN PROPANEDIOL (FARXIGA) 5 MG TABS TABLET    Take 5 mg by mouth daily.   JANUVIA 100 MG TABLET    Take 1 tablet by mouth daily.   LOVASTATIN (MEVACOR) 20 MG TABLET    TAKE 1 TABLET BY MOUTH EVERY DAY   METFORMIN (GLUCOPHAGE-XR) 500 MG 24 HR TABLET    TAKE 2 TABLETS BY MOUTH TWICE A DAY   PAROXETINE (PAXIL) 20 MG TABLET    TAKE 1 TABLET BY MOUTH DAILY   PROAIR HFA 108 (90 BASE) MCG/ACT INHALER    INHALE 2 PUFFS INTO THE LUNGS EVERY 6 HOURS AS NEEDED FOR WHEEZING OR SHORTNESS OF BREATH    Review of Systems  Constitutional: Positive  for fatigue.  Eyes: Positive for visual disturbance.  Respiratory: Positive for cough.   Cardiovascular: Positive for chest pain.  Gastrointestinal: Positive for abdominal pain, diarrhea and nausea.  Musculoskeletal: Positive for arthralgias, back pain and myalgias.  Neurological: Positive for dizziness and headaches.    Social History  Substance Use Topics  . Smoking status: Current Every Day Smoker    Packs/day: 0.25    Years: 30.00    Types: Cigarettes  . Smokeless tobacco: Never Used     Comment: trying to quit by end of 2016  . Alcohol use 0.0 oz/week     Comment: OCCASIONALLY   Objective:   BP 128/72 (BP Location: Right Arm, Patient  Position: Sitting, Cuff Size: Normal)   Pulse 94   Temp 98.8 F (37.1 C) (Oral)   Resp 16   Wt 119 lb 6.4 oz (54.2 kg)   SpO2 98%   BMI 23.32 kg/m  Wt Readings from Last 3 Encounters:  04/30/16 119 lb 6.4 oz (54.2 kg)  03/12/16 119 lb (54 kg)  10/21/15 121 lb (54.9 kg)    Physical Exam  Constitutional: She is oriented to person, place, and time. She appears well-developed and well-nourished.  HENT:  Head: Normocephalic.  Right Ear: External ear normal.  Left Ear: External ear normal.  Nose: Nose normal.  Mouth/Throat: Oropharynx is clear and moist.  Eyes: Conjunctivae and EOM are normal.  Neck: Neck supple. No thyromegaly present.  Cardiovascular: Normal rate, regular rhythm and normal heart sounds.   Pulmonary/Chest: Effort normal and breath sounds normal.  Abdominal: Soft. Bowel sounds are normal. She exhibits no distension and no mass. There is no tenderness. There is no rebound and no guarding.  Musculoskeletal: Normal range of motion. She exhibits no edema.  Lymphadenopathy:    She has no cervical adenopathy.  Neurological: She is alert and oriented to person, place, and time. She has normal reflexes.  Psychiatric: She has a normal mood and affect. Her behavior is normal. Thought content normal.      Assessment & Plan:     1. Abdominal pain, unspecified abdominal location Having nausea "daily" in the evening but no weight loss. Some diarrhea frequently, generalized malaise, fatigue, back and leg pains, left hand numbness and headaches. Symptoms are intermittent but frequent. Has a history of non-alcoholic fatty liver. Abdomina pains have been varied in location with diarrhea. Will check labs for infection, pancreatitis, hepatitis, B12 deficiency and may need recheck by gastroenterologist pending lab reports. - CBC with Differential/Platelet - Comprehensive metabolic panel - Amylase - B12  2. Malaise and fatigue Multiple symptoms of arthralgias, headache, fatigue,  stomach ache, nausea, back pain and blurred vision. Will check labs for anemia, metabolic imbalances, B12 deficiency and follow up pending reports. - CBC with Differential/Platelet - Comprehensive metabolic panel - Iron - Iron and TIBC - B12  3. Uncontrolled type 2 diabetes mellitus with hyperosmolarity without coma, without long-term current use of insulin (HCC) Symptoms complex and a bit vague (nausea daily, stomach pain, headache, blurring of vision, shakes, fatigue and general malaise). States FBS 100-120 at home. No documented hypoglycemic episodes (lowest BS was 80 and only once without symptoms). Hgb A1C 7.5% today. Will continue Farxiga 5 mg at breakfast, Januvia 100 mg at supper and Metformin 500 mg BID. Recheck CBC and CMP. May need evaluation by endocrinologist/internal medicine. - POCT HgB A1C - CBC with Differential/Platelet - Comprehensive metabolic panel  4. Essential (primary) hypertension Very good BP control with Lotensin-HCT 10/12,5 mg qd.  Will check follow up labs and continue present dosage. - CBC with Differential/Platelet - Comprehensive metabolic panel  5. Complex renal cyst 1.2 left upper pole Bosniak-2 complex cyst identified on ultrasound of 07-15-14. Follow up by urologist at [facility] on 08-27-15 showed no change. He recommended follow up again in 12 month. If any changes, he plans to do MRI.  6. Hepatic steatosis Fatty liver noted on abdominal ultrasound 07-15-14 and essentially unchanged on repeat by urologist on 08-27-15. Will multiple symptom complaints, will recheck labs. - Comprehensive metabolic panel - R60

## 2016-05-04 ENCOUNTER — Telehealth: Payer: Self-pay

## 2016-05-04 LAB — CBC WITH DIFFERENTIAL/PLATELET
BASOS ABS: 0.1 10*3/uL (ref 0.0–0.2)
Basos: 1 %
EOS (ABSOLUTE): 0.2 10*3/uL (ref 0.0–0.4)
Eos: 2 %
Hematocrit: 41.3 % (ref 34.0–46.6)
Hemoglobin: 14.1 g/dL (ref 11.1–15.9)
IMMATURE GRANS (ABS): 0 10*3/uL (ref 0.0–0.1)
IMMATURE GRANULOCYTES: 1 %
LYMPHS: 31 %
Lymphocytes Absolute: 2.4 10*3/uL (ref 0.7–3.1)
MCH: 29.8 pg (ref 26.6–33.0)
MCHC: 34.1 g/dL (ref 31.5–35.7)
MCV: 87 fL (ref 79–97)
MONOS ABS: 0.5 10*3/uL (ref 0.1–0.9)
Monocytes: 6 %
NEUTROS PCT: 59 %
Neutrophils Absolute: 4.4 10*3/uL (ref 1.4–7.0)
PLATELETS: 355 10*3/uL (ref 150–379)
RBC: 4.73 x10E6/uL (ref 3.77–5.28)
RDW: 14.3 % (ref 12.3–15.4)
WBC: 7.5 10*3/uL (ref 3.4–10.8)

## 2016-05-04 LAB — VITAMIN B12: Vitamin B-12: 313 pg/mL (ref 232–1245)

## 2016-05-04 LAB — AMYLASE: Amylase: 82 U/L (ref 31–124)

## 2016-05-04 LAB — COMPREHENSIVE METABOLIC PANEL
A/G RATIO: 2.3 — AB (ref 1.2–2.2)
ALT: 20 IU/L (ref 0–32)
AST: 14 IU/L (ref 0–40)
Albumin: 4.4 g/dL (ref 3.5–5.5)
Alkaline Phosphatase: 70 IU/L (ref 39–117)
BILIRUBIN TOTAL: 0.3 mg/dL (ref 0.0–1.2)
BUN/Creatinine Ratio: 18 (ref 9–23)
BUN: 12 mg/dL (ref 6–24)
CALCIUM: 9.3 mg/dL (ref 8.7–10.2)
CHLORIDE: 98 mmol/L (ref 96–106)
CO2: 25 mmol/L (ref 18–29)
Creatinine, Ser: 0.66 mg/dL (ref 0.57–1.00)
GFR calc non Af Amer: 100 mL/min/{1.73_m2} (ref >59)
GFR, EST AFRICAN AMERICAN: 116 mL/min/{1.73_m2} (ref >59)
GLUCOSE: 166 mg/dL — AB (ref 65–99)
Globulin, Total: 1.9 g/dL (ref 1.5–4.5)
POTASSIUM: 4.3 mmol/L (ref 3.5–5.2)
Sodium: 143 mmol/L (ref 134–144)
TOTAL PROTEIN: 6.3 g/dL (ref 6.0–8.5)

## 2016-05-04 LAB — IRON AND TIBC
IRON SATURATION: 15 % (ref 15–55)
IRON: 63 ug/dL (ref 27–159)
Total Iron Binding Capacity: 409 ug/dL (ref 250–450)
UIBC: 346 ug/dL (ref 131–425)

## 2016-05-04 NOTE — Telephone Encounter (Signed)
-----   Message from Tamsen Roersennis E Chrismon, [place] sent at 05/04/2016  8:28 AM EST ----- Normal B12 level and awaiting amylase and iron levels. Hemoglobin and hematocrit normal (usually drop with anemia). Blood sugar still high, but, better than 6 months ago. Continue present medications, watch diet and will let you know when the remainder of test results come in.

## 2016-05-04 NOTE — Telephone Encounter (Signed)
Patient has been advised. KW 

## 2016-05-06 ENCOUNTER — Telehealth: Payer: Self-pay

## 2016-05-06 NOTE — Telephone Encounter (Signed)
Patient called office today to get results of Amylase and Iron. I reviewed your annotation form last results and informed patient of the values from iron and amylase   , patient would like to know next steps to do since these results were okay. KW

## 2016-05-07 ENCOUNTER — Other Ambulatory Visit: Payer: Self-pay | Admitting: Family Medicine

## 2016-05-07 DIAGNOSIS — R1084 Generalized abdominal pain: Secondary | ICD-10-CM

## 2016-05-07 NOTE — Telephone Encounter (Signed)
Patient agreed to you placing in referral to G.I , patient is also asking you put in order for sleep study as well because it could be contributing to symptoms. KW

## 2016-05-07 NOTE — Telephone Encounter (Signed)
Recommend referral to gastroenterologist to evaluate abdominal pain, diarrhea, nausea and fatigue that has been present the past month.

## 2016-05-07 NOTE — Telephone Encounter (Signed)
Will need completion of Epworth Sleepiness questionnaire to satisfy insurance documentation before getting sleep study. Placed GI referral in chart - had upper endoscopy and colonoscopy by Dr. Servando SnareWohl in 2016.

## 2016-05-07 NOTE — Telephone Encounter (Signed)
Patient has been advised  And questionnaire left at front for pick up. KW

## 2016-05-24 ENCOUNTER — Other Ambulatory Visit: Payer: Self-pay | Admitting: Family Medicine

## 2016-05-24 DIAGNOSIS — E1100 Type 2 diabetes mellitus with hyperosmolarity without nonketotic hyperglycemic-hyperosmolar coma (NKHHC): Secondary | ICD-10-CM

## 2016-05-24 DIAGNOSIS — I10 Essential (primary) hypertension: Secondary | ICD-10-CM

## 2016-05-27 ENCOUNTER — Telehealth: Payer: Self-pay | Admitting: Family Medicine

## 2016-05-27 NOTE — Telephone Encounter (Signed)
Pt states she has a message from ZearingDennis discussing the sleep study.  Pt states she would like to go ahead and have a sleep study.  CB#[phone]/[id number]

## 2016-05-27 NOTE — Telephone Encounter (Signed)
Was the Epworth Sleepiness Questionnaire completed and scanned into her chart? Having difficulty finding it. Need it when trying to schedule a home sleep study for snoring/sleep apnea.

## 2016-05-27 NOTE — Telephone Encounter (Signed)
Please review. Thanks!  

## 2016-05-31 ENCOUNTER — Telehealth: Payer: Self-pay | Admitting: Gastroenterology

## 2016-05-31 ENCOUNTER — Encounter: Payer: Self-pay | Admitting: Gastroenterology

## 2016-05-31 ENCOUNTER — Other Ambulatory Visit: Payer: Self-pay

## 2016-05-31 ENCOUNTER — Ambulatory Visit (INDEPENDENT_AMBULATORY_CARE_PROVIDER_SITE_OTHER): Payer: BC Managed Care – PPO | Admitting: Gastroenterology

## 2016-05-31 VITALS — BP 137/80 | HR 101 | Temp 98.8°F | Ht 60.0 in | Wt 120.0 lb

## 2016-05-31 DIAGNOSIS — R112 Nausea with vomiting, unspecified: Secondary | ICD-10-CM

## 2016-05-31 DIAGNOSIS — R1084 Generalized abdominal pain: Secondary | ICD-10-CM

## 2016-05-31 MED ORDER — DICYCLOMINE HCL 10 MG PO CAPS: 10.0000 mg | ORAL_CAPSULE | Freq: Three times a day (TID) | ORAL | 3 refills | Status: DC

## 2016-05-31 NOTE — Progress Notes (Signed)
Primary Care Physician: Mila Merry, MD  Primary Gastroenterologist:  Dr. Midge Minium  Chief Complaint  Patient presents with  . Generalized abdominal pain    HPI: Dana Weber is a 56 y.o. female here for continued abdominal pain. The patient also reports that she has vomiting. The patient had an EGD and colonoscopy in the past for her history of diarrhea with abdominal discomfort. The biopsies in the terminal ileum and colon were all normal. The patient now reports that she continues to have the abdominal pain and she will wake up and vomit food she had the admitting hours prior. The patient also has reported a weight loss of 12 pounds since the last time she saw me. She denies any black stools or bloody stools. She also denies trying to lose any weight.  Current Outpatient Prescriptions  Medication Sig Dispense Refill  . benazepril-hydrochlorthiazide (LOTENSIN HCT) 10-12.5 MG tablet TAKE 1 TABLET BY MOUTH DAILY. 30 tablet 12  . FARXIGA 5 MG TABS tablet TAKE 1 TABLET BY MOUTH EVERY DAY 90 tablet 0  . JANUVIA 100 MG tablet Take 1 tablet by mouth daily.    Marland Kitchen lovastatin (MEVACOR) 20 MG tablet TAKE 1 TABLET BY MOUTH EVERY DAY 90 tablet 3  . metFORMIN (GLUCOPHAGE-XR) 500 MG 24 hr tablet TAKE 2 TABLETS BY MOUTH TWICE A DAY 360 tablet 3  . PARoxetine (PAXIL) 20 MG tablet TAKE 1 TABLET BY MOUTH DAILY 90 tablet 3  . PROAIR HFA 108 (90 Base) MCG/ACT inhaler INHALE 2 PUFFS INTO THE LUNGS EVERY 6 HOURS AS NEEDED FOR WHEEZING OR SHORTNESS OF BREATH 8.5 g 0  . dicyclomine (BENTYL) 10 MG capsule Take 1 capsule (10 mg total) by mouth 3 (three) times daily before meals. 90 capsule 3   No current facility-administered medications for this visit.     Allergies as of 05/31/2016 - Review Complete 05/31/2016  Allergen Reaction Noted  . Amoxicillin Swelling 11/05/2014  . Hydrocodone bitartrate er Swelling 11/05/2014    ROS:  General: Negative for anorexia, weight loss, fever, chills,  fatigue, weakness. ENT: Negative for hoarseness, difficulty swallowing , nasal congestion. CV: Negative for chest pain, angina, palpitations, dyspnea on exertion, peripheral edema.  Respiratory: Negative for dyspnea at rest, dyspnea on exertion, cough, sputum, wheezing.  GI: See history of present illness. GU:  Negative for dysuria, hematuria, urinary incontinence, urinary frequency, nocturnal urination.  Endo: Negative for unusual weight change.    Physical Examination:   BP 137/80   Pulse (!) 101   Temp 98.8 F (37.1 C) (Oral)   Ht 5' (1.524 m)   Wt 120 lb (54.4 kg)   BMI 23.44 kg/m   General: Well-nourished, well-developed in no acute distress.  Eyes: No icterus. Conjunctivae pink. Mouth: Oropharyngeal mucosa moist and pink , no lesions erythema or exudate. Lungs: Clear to auscultation bilaterally. Non-labored. Heart: Regular rate and rhythm, no murmurs rubs or gallops.  Abdomen: Bowel sounds are normal, nontender, nondistended, no hepatosplenomegaly or masses, no abdominal bruits or hernia , no rebound or guarding.   Extremities: No lower extremity edema. No clubbing or deformities. Neuro: Alert and oriented x 3.  Grossly intact. Skin: Warm and dry, no jaundice.   Psych: Alert and cooperative, normal mood and affect.  Labs:    Imaging Studies: No results found.  Assessment and Plan:   [name] is a 56 y.o. y/o female who comes in today with a 12 pound weight loss and a history of diabetes for the last  20 years. The patient has a history of fatty liver and has had normal liver enzymes with stable ultrasound of her liver. The patient also reports that her diarrhea is sometimes fatty. She also has intermittent constipation with diarrhea. The patient will be set up for a stool fecal fat test and she will also be set up for a gastric emptying study. The patient has been given a prescription for dicyclomine 10 mg 3 times a day to be started after she has the gastric  emptying study. The patient has been explained the plan and will be contacted with results of her workup.    Midge Miniumarren Welles Walthall, MD. Clementeen GrahamFACG   Note: This dictation was prepared with Dragon dictation along with smaller phrase technology. Any transcriptional errors that result from this process are unintentional.

## 2016-05-31 NOTE — Telephone Encounter (Signed)
Patient called and stated she needs to schedule for some kind of test. She said you would know what it was.  Can you schedule her on March 14 or 15 or 28 or 29?

## 2016-06-01 ENCOUNTER — Other Ambulatory Visit: Payer: Self-pay

## 2016-06-01 ENCOUNTER — Other Ambulatory Visit: Payer: Self-pay | Admitting: Family Medicine

## 2016-06-01 DIAGNOSIS — R0683 Snoring: Secondary | ICD-10-CM

## 2016-06-01 NOTE — Telephone Encounter (Signed)
Ask the patient to fill out another Epworth Sleepiness Questionnaire so we can have the documentation for insurance purposes.

## 2016-06-01 NOTE — Telephone Encounter (Signed)
Called patient and Epworth Sleepiness Scale has been documented into her chart. You should see it in the encounters tab (make sure you uncheck the box to see all). Thanks!

## 2016-06-01 NOTE — Telephone Encounter (Signed)
I was not able to find the questionnaire either. I couldn't find it up front either.

## 2016-06-01 NOTE — Telephone Encounter (Signed)
Pt is scheduled for a gastric [facility] on Wednesday, March 28th @ 9:30am. Pt has been advised to arrive at 9:15am and to be NPO after midnight.

## 2016-06-01 NOTE — Progress Notes (Signed)
Epworth Sleepiness score is 16 with history of some snoring and fatigue. Schedule home sleep study.

## 2016-06-01 NOTE — Progress Notes (Signed)
Epworth Sleepiness Scale:   Sitting and reading: High chance of dozing  Watching TV: High chance of dozing  Sitting, inactive in a public place (e.g. a theatre or a meeting): Moderate chance of dozing  As a passenger in a car for an hour without a break: High chance of dozing  Lying down to rest in the afternoon when circumstances permit: High chance of dozing  Sitting and talking to someone: Would never doze  Sitting quietly after a lunch without alcohol: Moderate chance of dozing  In a car, while stopped for a few minutes in traffic: Would never doze   Total score: 16

## 2016-06-08 ENCOUNTER — Telehealth: Payer: Self-pay | Admitting: Family Medicine

## 2016-06-08 LAB — FECAL FAT, QUALITATIVE
FAT QUAL NEUTRAL STL: NORMAL
FAT QUAL TOTAL STL: NORMAL

## 2016-06-08 NOTE — Telephone Encounter (Signed)
[facility] and would like a call back. Thanks TNP

## 2016-06-10 NOTE — Telephone Encounter (Signed)
I called patient back to get more information. Patient states she is waiting to hear back about a sleep study being ordered. Referral is in process and patient is waiting for a call from Sarah.

## 2016-06-10 NOTE — Telephone Encounter (Signed)
[facility]

## 2016-06-16 ENCOUNTER — Ambulatory Visit
Admission: RE | Admit: 2016-06-16 | Discharge: 2016-06-16 | Disposition: A | Discharge status: Home / Self Care | Payer: BC Managed Care – PPO | Source: Ambulatory Visit | Attending: Gastroenterology | Admitting: Gastroenterology

## 2016-06-16 DIAGNOSIS — R112 Nausea with vomiting, unspecified: Secondary | ICD-10-CM | POA: Diagnosis present

## 2016-06-16 DIAGNOSIS — R1084 Generalized abdominal pain: Secondary | ICD-10-CM | POA: Diagnosis not present

## 2016-06-16 MED ORDER — TECHNETIUM TC 99M SULFUR COLLOID
2.4100 | Freq: Once | INTRAVENOUS | Status: AC | PRN
  Administered 2016-06-16: 2.41 via ORAL

## 2016-06-17 ENCOUNTER — Telehealth: Payer: Self-pay

## 2016-06-17 NOTE — Telephone Encounter (Signed)
-----   Message from [name], MD sent at 06/17/2016  7:45 AM EDT ----- Let the patient know the gastric emptying study was normal.

## 2016-06-17 NOTE — Telephone Encounter (Signed)
-----   Message from [name], MD sent at 06/16/2016  8:19 AM EDT ----- Let the patient know that there is no increased fat in her stools.

## 2016-06-17 NOTE — Telephone Encounter (Signed)
Pt notified of gastric empyting study and labs. Pt stated she was still having some nausea.

## 2016-07-09 ENCOUNTER — Other Ambulatory Visit: Payer: Self-pay | Admitting: Family Medicine

## 2016-07-09 DIAGNOSIS — J069 Acute upper respiratory infection, unspecified: Secondary | ICD-10-CM

## 2016-07-09 NOTE — Telephone Encounter (Signed)
LOV 04/30/2016

## 2016-07-15 ENCOUNTER — Ambulatory Visit: Payer: BC Managed Care – PPO | Attending: Otolaryngology

## 2016-07-15 DIAGNOSIS — R0683 Snoring: Secondary | ICD-10-CM | POA: Diagnosis not present

## 2016-07-22 ENCOUNTER — Telehealth: Payer: Self-pay

## 2016-07-22 NOTE — Telephone Encounter (Signed)
LMTCB regarding sleep study results. Results showed no sleep apnea. Recommend less sleeping on her back.

## 2016-07-22 NOTE — Telephone Encounter (Signed)
Patient advised.

## 2016-07-27 ENCOUNTER — Encounter: Payer: Self-pay | Admitting: Family Medicine

## 2016-07-31 ENCOUNTER — Other Ambulatory Visit: Payer: Self-pay | Admitting: Family Medicine

## 2016-07-31 DIAGNOSIS — J069 Acute upper respiratory infection, unspecified: Secondary | ICD-10-CM

## 2016-08-20 ENCOUNTER — Encounter: Payer: Self-pay | Admitting: Family Medicine

## 2016-08-20 ENCOUNTER — Ambulatory Visit (INDEPENDENT_AMBULATORY_CARE_PROVIDER_SITE_OTHER): Payer: BC Managed Care – PPO | Admitting: Family Medicine

## 2016-08-20 ENCOUNTER — Ambulatory Visit
Admission: RE | Admit: 2016-08-20 | Discharge: 2016-08-20 | Disposition: A | Discharge status: Home / Self Care | Payer: BC Managed Care – PPO | Source: Ambulatory Visit | Attending: Family Medicine | Admitting: Family Medicine

## 2016-08-20 VITALS — BP 120/80 | HR 75 | Temp 99.2°F | Resp 16 | Ht 60.0 in | Wt 117.0 lb

## 2016-08-20 DIAGNOSIS — G8929 Other chronic pain: Secondary | ICD-10-CM | POA: Diagnosis present

## 2016-08-20 DIAGNOSIS — R05 Cough: Secondary | ICD-10-CM

## 2016-08-20 DIAGNOSIS — F17210 Nicotine dependence, cigarettes, uncomplicated: Secondary | ICD-10-CM | POA: Diagnosis not present

## 2016-08-20 DIAGNOSIS — M5030 Other cervical disc degeneration, unspecified cervical region: Secondary | ICD-10-CM | POA: Diagnosis not present

## 2016-08-20 DIAGNOSIS — M542 Cervicalgia: Secondary | ICD-10-CM | POA: Diagnosis not present

## 2016-08-20 DIAGNOSIS — R059 Cough, unspecified: Secondary | ICD-10-CM | POA: Insufficient documentation

## 2016-08-20 MED ORDER — LEVOFLOXACIN 500 MG PO TABS: 500.0000 mg | ORAL_TABLET | Freq: Every day | ORAL | 0 refills | Status: DC

## 2016-08-20 MED ORDER — CYCLOBENZAPRINE HCL 5 MG PO TABS: 5.0000 mg | ORAL_TABLET | Freq: Three times a day (TID) | ORAL | 1 refills | Status: DC | PRN

## 2016-08-20 MED ORDER — DICLOFENAC POTASSIUM 50 MG PO TABS: 50.0000 mg | ORAL_TABLET | Freq: Three times a day (TID) | ORAL | 0 refills | Status: DC | PRN

## 2016-08-20 NOTE — Progress Notes (Signed)
Patient: Dana MimesMarcia Jolayne Hemmingway Female    DOB: 09/06/1960   56 y.o.   MRN: [id number] Visit Date: 08/20/2016  Today's Provider: Mila Merryonald Fisher, MD   Chief Complaint  Patient presents with  . Neck Pain  . Shoulder Pain  . Back Pain   Subjective:    Patient has been having pain in her neck, shoulder and back pain 4 months. Patient states that she has been using otc treatments with no relief. Pain is constant and worse on right. Feels like causes headaches. Radiates to right shoulder and arm. Taken Aleve, tylenol  Without relief. Keeps up a night, no spasms.    Neck Pain   This is a new problem. The current episode started more than 1 year ago (4 months ago). The problem occurs constantly. The problem has been gradually worsening. The pain is associated with an unknown factor. The pain is present in the right side. The pain is at a severity of 7/10. The pain is moderate. The symptoms are aggravated by twisting, position and bending. The pain is same all the time. Stiffness is present all day. Associated symptoms include headaches, leg pain, numbness and tingling. Pertinent negatives include no chest pain, fever, pain with swallowing, paresis, photophobia, syncope, trouble swallowing, visual change, weakness or weight loss. She has tried acetaminophen, heat, ice and NSAIDs for the symptoms. The treatment provided no relief.       Allergies  Allergen Reactions  . Amoxicillin Swelling    Facial, and itching  . Hydrocodone Bitartrate Er Swelling     Current Outpatient Prescriptions:  .  benazepril-hydrochlorthiazide (LOTENSIN HCT) 10-12.5 MG tablet, TAKE 1 TABLET BY MOUTH DAILY., Disp: 30 tablet, Rfl: 12 .  FARXIGA 5 MG TABS tablet, TAKE 1 TABLET BY MOUTH EVERY DAY, Disp: 90 tablet, Rfl: 0 .  JANUVIA 100 MG tablet, Take 1 tablet by mouth daily., Disp: , Rfl:  .  lovastatin (MEVACOR) 20 MG tablet, TAKE 1 TABLET BY MOUTH EVERY DAY, Disp: 90 tablet, Rfl: 3 .  metFORMIN (GLUCOPHAGE-XR) 500  MG 24 hr tablet, TAKE 2 TABLETS BY MOUTH TWICE A DAY, Disp: 360 tablet, Rfl: 3 .  PARoxetine (PAXIL) 20 MG tablet, TAKE 1 TABLET BY MOUTH DAILY, Disp: 90 tablet, Rfl: 3 .  PROAIR HFA 108 (90 Base) MCG/ACT inhaler, INHALE 2 PUFFS BY MOUTH EVERY 6 HOURS AS NEEDED FOR WHEEZING OR SHORTNESS OF BREATH, Disp: 8.5 g, Rfl: 0  Review of Systems  Constitutional: Negative for appetite change, chills, fatigue, fever and weight loss.  HENT: Negative for trouble swallowing.   Eyes: Negative for photophobia.  Respiratory: Positive for cough and wheezing. Negative for chest tightness and shortness of breath.   Cardiovascular: Negative for chest pain, palpitations and syncope.  Gastrointestinal: Negative for nausea and vomiting.  Musculoskeletal: Positive for back pain, neck pain and neck stiffness.  Neurological: Positive for tingling, numbness and headaches. Negative for dizziness and weakness.    Social History  Substance Use Topics  . Smoking status: Current Every Day Smoker    Packs/day: 0.25    Years: 30.00    Types: Cigarettes  . Smokeless tobacco: Never Used     Comment: trying to quit by end of 2016  . Alcohol use 0.0 oz/week     Comment: OCCASIONALLY   Objective:   BP 120/80 (BP Location: Right Arm, Patient Position: Sitting, Cuff Size: Normal)   Pulse 75   Temp 99.2 F (37.3 C) (Oral)   Resp 16  Ht 5' (1.524 m)   Wt 117 lb (53.1 kg)   SpO2 96%   BMI 22.85 kg/m     Physical Exam  General appearance: alert, well developed, well nourished, cooperative and in no distress Head: Normocephalic, without obvious abnormality, atraumatic Respiratory: Respirations even and unlabored, normal respiratory rate Extremities: No gross deformities Skin: Skin color, texture, turgor normal. No rashes seen  Pulm: Occasional expiratory wheeze, no rales, Neurologic: Mental status: Alert, oriented to person, place, and time, thought content appropriate. Diffuse tenderness c-spine and upper T-spine.  FROM of shoulders and neck with pain on lmits of flexion and extension. +5/5 muscle strength of both UEs.     Assessment & Plan:     1. Chronic neck pain  - DG Cervical Spine Complete; Future - cyclobenzaprine (FLEXERIL) 5 MG tablet; Take 1-2 tablets (5-10 mg total) by mouth 3 (three) times daily as needed (shoulder pain).  Dispense: 30 tablet; Refill: 1 - diclofenac (CATAFLAM) 50 MG tablet; Take 1 tablet (50 mg total) by mouth 3 (three) times daily as needed (neck pain).  Dispense: 30 tablet; Refill: 0  2. Smoking greater than 30 pack years  -Low dose CT screening - Ambulatory Referral for Lung Cancer Scre  3. Cough Likely exacerbation chronic bronchitis. Call if symptoms change or if not rapidly improving.     - levofloxacin (LEVAQUIN) 500 MG tablet; Take 1 tablet (500 mg total) by mouth daily.  Dispense: 7 tablet; Refill: 0       Mila Merry, MD  [facility]

## 2016-08-21 ENCOUNTER — Other Ambulatory Visit: Payer: Self-pay | Admitting: Family Medicine

## 2016-08-21 DIAGNOSIS — E1100 Type 2 diabetes mellitus with hyperosmolarity without nonketotic hyperglycemic-hyperosmolar coma (NKHHC): Secondary | ICD-10-CM

## 2016-08-21 DIAGNOSIS — I10 Essential (primary) hypertension: Secondary | ICD-10-CM

## 2016-08-23 ENCOUNTER — Telehealth: Payer: Self-pay | Admitting: *Deleted

## 2016-08-23 DIAGNOSIS — Z87891 Personal history of nicotine dependence: Secondary | ICD-10-CM

## 2016-08-23 NOTE — Telephone Encounter (Signed)
lmtcb to schedule appointment. Emily Drozdowski, CMA  

## 2016-08-23 NOTE — Telephone Encounter (Signed)
Received referral for low dose lung cancer screening CT scan. Message left at phone number listed in EMR for patient to call me back to facilitate scheduling scan.  

## 2016-08-23 NOTE — Telephone Encounter (Signed)
Received referral for initial lung cancer screening scan. Contacted patient and obtained smoking history,(current, 84 pack year) as well as answering questions related to screening process. Patient denies signs of lung cancer such as weight loss or hemoptysis. Patient denies comorbidity that would prevent curative treatment if lung cancer were found. Patient is scheduled for shared decision making visit and CT scan on 09/07/16.

## 2016-08-23 NOTE — Telephone Encounter (Signed)
Due for recheck of diabetes and labs in the next 4-6 weeks. Refilled [place]xiga.

## 2016-08-24 ENCOUNTER — Telehealth: Payer: Self-pay

## 2016-08-24 NOTE — Telephone Encounter (Signed)
Advised patient as below. Patient reports that the muscle relaxer seems to help with the pain. She is wanting to stay on the medication instead of doing physical therapy. Is she able to continue taking this long term? Please advise. Thanks!

## 2016-08-24 NOTE — Telephone Encounter (Signed)
Yes she can continue muscle relaxer as long as she needs to to. There are no long term side effects and it is not addictive.

## 2016-08-24 NOTE — Telephone Encounter (Signed)
Patient advised.

## 2016-08-24 NOTE — Telephone Encounter (Signed)
-----   Message from Malva Limesonald E Fisher, MD sent at 08/24/2016  7:51 AM EDT ----- Jillyn Hiddenxrays show degenerative disc disease in spine of neck with a few bone spurs. Should be improving within a week. If not getting better over the next week will need referral to physical therapy.

## 2016-08-26 ENCOUNTER — Other Ambulatory Visit: Payer: Self-pay | Admitting: Family Medicine

## 2016-08-27 ENCOUNTER — Other Ambulatory Visit: Payer: Self-pay | Admitting: Family Medicine

## 2016-08-27 DIAGNOSIS — E1100 Type 2 diabetes mellitus with hyperosmolarity without nonketotic hyperglycemic-hyperosmolar coma (NKHHC): Secondary | ICD-10-CM

## 2016-08-31 ENCOUNTER — Other Ambulatory Visit: Payer: Self-pay | Admitting: Family Medicine

## 2016-08-31 DIAGNOSIS — J069 Acute upper respiratory infection, unspecified: Secondary | ICD-10-CM

## 2016-09-06 ENCOUNTER — Encounter: Payer: Self-pay | Admitting: Oncology

## 2016-09-07 ENCOUNTER — Inpatient Hospital Stay: Payer: BC Managed Care – PPO | Attending: Oncology | Admitting: Oncology

## 2016-09-07 ENCOUNTER — Ambulatory Visit
Admission: RE | Admit: 2016-09-07 | Discharge: 2016-09-07 | Disposition: A | Discharge status: Home / Self Care | Payer: BC Managed Care – PPO | Source: Ambulatory Visit | Attending: Oncology | Admitting: Oncology

## 2016-09-07 DIAGNOSIS — Z122 Encounter for screening for malignant neoplasm of respiratory organs: Secondary | ICD-10-CM

## 2016-09-07 DIAGNOSIS — Z9049 Acquired absence of other specified parts of digestive tract: Secondary | ICD-10-CM | POA: Insufficient documentation

## 2016-09-07 DIAGNOSIS — I2510 Atherosclerotic heart disease of native coronary artery without angina pectoris: Secondary | ICD-10-CM | POA: Insufficient documentation

## 2016-09-07 DIAGNOSIS — Z87891 Personal history of nicotine dependence: Secondary | ICD-10-CM

## 2016-09-07 DIAGNOSIS — J432 Centrilobular emphysema: Secondary | ICD-10-CM | POA: Insufficient documentation

## 2016-09-07 DIAGNOSIS — I700 Atherosclerosis of aorta: Secondary | ICD-10-CM | POA: Diagnosis not present

## 2016-09-07 DIAGNOSIS — F17210 Nicotine dependence, cigarettes, uncomplicated: Secondary | ICD-10-CM | POA: Diagnosis not present

## 2016-09-08 NOTE — Progress Notes (Signed)
In accordance with CMS guidelines, patient has met eligibility criteria including age, absence of signs or symptoms of lung cancer.  Social History  Substance Use Topics  . Smoking status: Current Every Day Smoker    Packs/day: 2.00    Years: 42.00    Types: Cigarettes  . Smokeless tobacco: Never Used     Comment: trying to quit by end of 2016  . Alcohol use 0.0 oz/week     Comment: OCCASIONALLY     A shared decision-making session was conducted prior to the performance of CT scan. This includes one or more decision aids, includes benefits and harms of screening, follow-up diagnostic testing, over-diagnosis, false positive rate, and total radiation exposure.  Counseling on the importance of adherence to annual lung cancer LDCT screening, impact of co-morbidities, and ability or willingness to undergo diagnosis and treatment is imperative for compliance of the program.  Counseling on the importance of continued smoking cessation for former smokers; the importance of smoking cessation for current smokers, and information about tobacco cessation interventions have been given to patient including [place] and [facility] [place] programs.  Written order for lung cancer screening with LDCT has been given to the patient and any and all questions have been answered to the best of my abilities.   Yearly follow up will be coordinated by Burgess Estelle, Thoracic Navigator.

## 2016-09-09 ENCOUNTER — Encounter: Payer: Self-pay | Admitting: *Deleted

## 2016-09-14 LAB — HM DIABETES EYE EXAM

## 2016-09-23 ENCOUNTER — Other Ambulatory Visit: Payer: Self-pay | Admitting: Family Medicine

## 2016-09-28 ENCOUNTER — Encounter: Payer: Self-pay | Admitting: Family Medicine

## 2016-09-28 ENCOUNTER — Other Ambulatory Visit: Payer: Self-pay | Admitting: Family Medicine

## 2016-09-28 DIAGNOSIS — J069 Acute upper respiratory infection, unspecified: Secondary | ICD-10-CM

## 2016-11-25 ENCOUNTER — Other Ambulatory Visit: Payer: Self-pay | Admitting: Family Medicine

## 2016-11-25 DIAGNOSIS — E1100 Type 2 diabetes mellitus with hyperosmolarity without nonketotic hyperglycemic-hyperosmolar coma (NKHHC): Secondary | ICD-10-CM

## 2016-11-25 DIAGNOSIS — I10 Essential (primary) hypertension: Secondary | ICD-10-CM

## 2016-12-14 ENCOUNTER — Encounter: Payer: Self-pay | Admitting: Family Medicine

## 2017-02-23 ENCOUNTER — Other Ambulatory Visit: Payer: Self-pay | Admitting: Family Medicine

## 2017-02-23 DIAGNOSIS — E1100 Type 2 diabetes mellitus with hyperosmolarity without nonketotic hyperglycemic-hyperosmolar coma (NKHHC): Secondary | ICD-10-CM

## 2017-02-26 ENCOUNTER — Other Ambulatory Visit: Payer: Self-pay | Admitting: Family Medicine

## 2017-02-26 DIAGNOSIS — E1100 Type 2 diabetes mellitus with hyperosmolarity without nonketotic hyperglycemic-hyperosmolar coma (NKHHC): Secondary | ICD-10-CM

## 2017-02-26 DIAGNOSIS — I10 Essential (primary) hypertension: Secondary | ICD-10-CM

## 2017-03-10 ENCOUNTER — Telehealth: Payer: Self-pay | Admitting: Family Medicine

## 2017-03-10 NOTE — Telephone Encounter (Signed)
Pt is requesting an order for a 3D Mamm[facility]. Pt stated they won't do a 3D without an order. Pt stated she is requesting the 3D b/c when she has a regular mammogram they call her back to do a 3D and it cost her more out of pocket. Please advise. Thanks TNP

## 2017-03-10 NOTE — Telephone Encounter (Signed)
Please advise 

## 2017-03-11 NOTE — Telephone Encounter (Signed)
[facility] order [facility] for [facility]. Thanks.

## 2017-03-24 NOTE — Telephone Encounter (Signed)
Please place order.

## 2017-03-24 NOTE — Telephone Encounter (Signed)
Per [facility] (Phone [phone]) pt will not need an order for 3D screening mammogram.Pt only needs an order if diagnostic.Pt advised of this.

## 2017-03-25 NOTE — Telephone Encounter (Signed)
FYI. Thanks.

## 2017-05-06 ENCOUNTER — Other Ambulatory Visit: Payer: Self-pay | Admitting: Family Medicine

## 2017-05-20 ENCOUNTER — Encounter: Payer: Self-pay | Admitting: Family Medicine

## 2017-05-20 ENCOUNTER — Ambulatory Visit: Payer: BC Managed Care – PPO | Admitting: Family Medicine

## 2017-05-20 VITALS — BP 110/80 | HR 79 | Temp 97.9°F | Resp 16 | Wt 124.0 lb

## 2017-05-20 DIAGNOSIS — M25551 Pain in right hip: Secondary | ICD-10-CM | POA: Diagnosis not present

## 2017-05-20 MED ORDER — METHYLPREDNISOLONE ACETATE 80 MG/ML IJ SUSP
80.0000 mg | Freq: Once | INTRAMUSCULAR | Status: AC
  Administered 2017-05-20: 80 mg via INTRAMUSCULAR

## 2017-05-20 MED ORDER — MELOXICAM 7.5 MG PO TABS: 7.5000 mg | ORAL_TABLET | Freq: Every day | ORAL | 4 refills | Status: DC | PRN

## 2017-05-20 MED ORDER — VARENICLINE TARTRATE 0.5 MG X 11 & 1 MG X 42 PO MISC: ORAL | 0 refills | Status: AC

## 2017-05-20 MED ORDER — VARENICLINE TARTRATE 1 MG PO TABS: 1.0000 mg | ORAL_TABLET | Freq: Two times a day (BID) | ORAL | 2 refills | Status: AC

## 2017-05-20 NOTE — Progress Notes (Signed)
Patient: Dana Weber Female    DOB: 06/09/1960   57 y.o.   MRN: [id number] Visit Date: 05/20/2017  Today's Provider: Mila Merryonald Merlin Golden, MD   Chief Complaint  Patient presents with  . Back Pain   Subjective:    Patient is here concerning chronic low back pain. Patient states she has starting having pain in her right hip. Pain radiates into her right buttock. Pain is moderate. Patient has been taking otc Advil and tylenol with mild relief.        Allergies  Allergen Reactions  . Amoxicillin Swelling    Facial, and itching  . Hydrocodone Bitartrate Er Swelling     Current Outpatient Medications:  .  benazepril-hydrochlorthiazide (LOTENSIN HCT) 10-12.5 MG tablet, TAKE 1 TABLET BY MOUTH DAILY., Disp: 30 tablet, Rfl: 11 .  cyclobenzaprine (FLEXERIL) 5 MG tablet, Take 1-2 tablets (5-10 mg total) by mouth 3 (three) times daily as needed (shoulder pain)., Disp: 30 tablet, Rfl: 1 .  diclofenac (CATAFLAM) 50 MG tablet, Take 1 tablet (50 mg total) by mouth 3 (three) times daily as needed (neck pain)., Disp: 30 tablet, Rfl: 0 .  FARXIGA 5 MG TABS tablet, TAKE 1 TABLET BY MOUTH EVERY DAY, Disp: 90 tablet, Rfl: 0 .  JANUVIA 100 MG tablet, TAKE 1 TABLET BY MOUTH EVERY DAY, Disp: 90 tablet, Rfl: 0 .  lovastatin (MEVACOR) 20 MG tablet, TAKE 1 TABLET BY MOUTH EVERY DAY, Disp: 90 tablet, Rfl: 0 .  metFORMIN (GLUCOPHAGE-XR) 500 MG 24 hr tablet, TAKE 2 TABLETS BY MOUTH TWICE A DAY, Disp: 360 tablet, Rfl: 0 .  PARoxetine (PAXIL) 20 MG tablet, TAKE 1 TABLET BY MOUTH DAILY, Disp: 90 tablet, Rfl: 4 .  PROAIR HFA 108 (90 Base) MCG/ACT inhaler, INHALE 2 PUFFS BY MOUTH EVERY 6 HOURS AS NEEDED FOR WHEEZING OR SHORTNESS OF BREATH, Disp: 8.5 g, Rfl: 5 .  levofloxacin (LEVAQUIN) 500 MG tablet, Take 1 tablet (500 mg total) by mouth daily. (Patient not taking: Reported on 05/20/2017), Disp: 7 tablet, Rfl: 0  Review of Systems  Constitutional: Negative for appetite change, chills and fatigue.    Respiratory: Negative for chest tightness and shortness of breath.   Cardiovascular: Negative for palpitations.  Gastrointestinal: Negative for nausea and vomiting.  Musculoskeletal: Positive for back pain.  Neurological: Negative for dizziness.    Social History   Tobacco Use  . Smoking status: Current Every Day Smoker    Packs/day: 2.00    Years: 42.00    Pack years: 84.00    Types: Cigarettes  . Smokeless tobacco: Never Used  . Tobacco comment: trying to quit by end of 2016  Substance Use Topics  . Alcohol use: Yes    Alcohol/week: 0.0 oz    Comment: OCCASIONALLY   Objective:   BP 110/80 (BP Location: Right Arm, Patient Position: Sitting, Cuff Size: Normal)   Pulse 79   Temp 97.9 F (36.6 C) (Oral)   Resp 16   Wt 124 lb (56.2 kg)   SpO2 98%   BMI 24.22 kg/m  Vitals:   05/20/17 1507  BP: 110/80  Pulse: 79  Resp: 16  Temp: 97.9 F (36.6 C)  TempSrc: Oral  SpO2: 98%  Weight: 124 lb (56.2 kg)     Physical Exam  General appearance: alert, well developed, well nourished, cooperative and in no distress Head: Normocephalic, without obvious abnormality, atraumatic Respiratory: Respirations even and unlabored, normal respiratory rate Extremities: moderate tenderness over right SI joint and lateral  hip and thigh. No gross deformities. FROM of hip without pain. Bears weight without difficulty.  Skin: Skin color, texture, turgor normal. No rashes seen  Psych: Appropriate mood and affect. Neurologic: Mental status: Alert, oriented to person, place, and time, thought content appropriate..    Assessment & Plan:     1. Right hip pain Diffuse area of inflammation including SI joint and lateral trochanter.  - methylPREDNISolone acetate (DEPO-MEDROL) injection 80 mg (IM right gluteal)  Call for orthopedic referral if not much better in a week.       Mila Merry, MD  [facility]

## 2017-05-27 ENCOUNTER — Other Ambulatory Visit: Payer: Self-pay | Admitting: Family Medicine

## 2017-05-27 DIAGNOSIS — I10 Essential (primary) hypertension: Secondary | ICD-10-CM

## 2017-05-27 DIAGNOSIS — E1100 Type 2 diabetes mellitus with hyperosmolarity without nonketotic hyperglycemic-hyperosmolar coma (NKHHC): Secondary | ICD-10-CM

## 2017-05-30 ENCOUNTER — Other Ambulatory Visit: Payer: Self-pay | Admitting: Family Medicine

## 2017-05-30 DIAGNOSIS — E1100 Type 2 diabetes mellitus with hyperosmolarity without nonketotic hyperglycemic-hyperosmolar coma (NKHHC): Secondary | ICD-10-CM

## 2017-06-07 ENCOUNTER — Encounter: Payer: Self-pay | Admitting: Family Medicine

## 2017-06-07 ENCOUNTER — Ambulatory Visit: Payer: BC Managed Care – PPO | Admitting: Family Medicine

## 2017-06-07 VITALS — BP 116/70 | HR 83 | Temp 98.9°F | Resp 16

## 2017-06-07 DIAGNOSIS — J029 Acute pharyngitis, unspecified: Secondary | ICD-10-CM | POA: Diagnosis not present

## 2017-06-07 DIAGNOSIS — M25551 Pain in right hip: Secondary | ICD-10-CM | POA: Diagnosis not present

## 2017-06-07 DIAGNOSIS — Z20828 Contact with and (suspected) exposure to other viral communicable diseases: Secondary | ICD-10-CM | POA: Diagnosis not present

## 2017-06-07 LAB — POCT RAPID STREP A (OFFICE): RAPID STREP A SCREEN: NEGATIVE

## 2017-06-07 LAB — POCT INFLUENZA A/B
INFLUENZA A, POC: NEGATIVE
INFLUENZA B, POC: NEGATIVE

## 2017-06-07 MED ORDER — OSELTAMIVIR PHOSPHATE 75 MG PO CAPS: 75.0000 mg | ORAL_CAPSULE | Freq: Every day | ORAL | 0 refills | Status: AC

## 2017-06-07 NOTE — Progress Notes (Signed)
Patient: Dana Weber Female    DOB: 06-14-1960   57 y.o.   MRN: [id number] Visit Date: 06/07/2017  Today's Provider: Mila Merry, MD   Chief Complaint  Patient presents with  . Sore Throat   Subjective:    Sore Throat   This is a new problem. The current episode started today (woke up this morning with sore throat). The problem has been unchanged. Neither side of throat is experiencing more pain than the other. There has been no fever. Associated symptoms include coughing (productive with clear-yellow colored sputum), headaches and trouble swallowing (hurts to swallow). Pertinent negatives include no abdominal pain, congestion (nasal), shortness of breath or vomiting. She has had exposure to strep (grandaughter was diagnosed with strep and influenza yesterday). She has tried acetaminophen for the symptoms. The treatment provided no relief.  Had some chills and sweats today. She states she was traveling with her granddaughter last week when her granddaughter got sick. Patient states she had positive flu and strep test yesterday.   She also reports her hip is still bothering and has not improved al all since she was given IM steroid injection on March 1st.     Allergies  Allergen Reactions  . Amoxicillin Swelling    Facial, and itching  . Hydrocodone Bitartrate Er Swelling     Current Outpatient Medications:  .  benazepril-hydrochlorthiazide (LOTENSIN HCT) 10-12.5 MG tablet, TAKE 1 TABLET BY MOUTH DAILY., Disp: 30 tablet, Rfl: 11 .  cyclobenzaprine (FLEXERIL) 5 MG tablet, Take 1-2 tablets (5-10 mg total) by mouth 3 (three) times daily as needed (shoulder pain)., Disp: 30 tablet, Rfl: 1 .  FARXIGA 5 MG TABS tablet, TAKE 1 TABLET BY MOUTH EVERY DAY, Disp: 90 tablet, Rfl: 0 .  JANUVIA 100 MG tablet, TAKE 1 TABLET BY MOUTH EVERY DAY, Disp: 90 tablet, Rfl: 0 .  lovastatin (MEVACOR) 20 MG tablet, TAKE 1 TABLET BY MOUTH EVERY DAY, Disp: 90 tablet, Rfl: 0 .  meloxicam (MOBIC)  7.5 MG tablet, Take 1-2 tablets (7.5-15 mg total) by mouth daily as needed for pain., Disp: 30 tablet, Rfl: 4 .  metFORMIN (GLUCOPHAGE-XR) 500 MG 24 hr tablet, TAKE 2 TABLETS BY MOUTH TWICE A DAY, Disp: 360 tablet, Rfl: 0 .  PARoxetine (PAXIL) 20 MG tablet, TAKE 1 TABLET BY MOUTH DAILY, Disp: 90 tablet, Rfl: 4 .  PROAIR HFA 108 (90 Base) MCG/ACT inhaler, INHALE 2 PUFFS BY MOUTH EVERY 6 HOURS AS NEEDED FOR WHEEZING OR SHORTNESS OF BREATH, Disp: 8.5 g, Rfl: 5 .  varenicline (CHANTIX CONTINUING MONTH PAK) 1 MG tablet, Take 1 tablet (1 mg total) by mouth 2 (two) times daily., Disp: 60 tablet, Rfl: 2 .  varenicline (CHANTIX STARTING MONTH PAK) 0.5 MG X 11 & 1 MG X 42 tablet, Take one 0.5 mg tablet daily for 3 days, then take one 0.5 mg tablet twice daily for 4 days, then take one 1 mg tablet twice daily, Disp: 53 tablet, Rfl: 0  Review of Systems  Constitutional: Positive for chills and diaphoresis. Negative for appetite change, fatigue and fever.  HENT: Positive for sore throat and trouble swallowing (hurts to swallow). Negative for congestion (nasal).   Respiratory: Positive for cough (productive with clear-yellow colored sputum). Negative for chest tightness, shortness of breath and wheezing.   Cardiovascular: Negative for chest pain and palpitations.  Gastrointestinal: Negative for abdominal pain, nausea and vomiting.  Musculoskeletal: Positive for myalgias.  Neurological: Positive for headaches. Negative for dizziness and weakness.  Social History   Tobacco Use  . Smoking status: Current Some Day Smoker    Years: 42.00    Types: Cigarettes  . Smokeless tobacco: Never Used  . Tobacco comment: has a cigarette every few days  Substance Use Topics  . Alcohol use: Yes    Alcohol/week: 0.0 oz    Comment: OCCASIONALLY   Objective:   BP 116/70 (BP Location: Left Arm, Patient Position: Sitting, Cuff Size: Normal)   Pulse 83   Temp 98.9 F (37.2 C) (Oral)   Resp 16   SpO2 97% Comment:  room air   Physical Exam  General Appearance:    Alert, cooperative, no distress  HENT:   bilateral TM normal without fluid or infection, neck without nodes, pharynx erythematous without exudate and sinuses nontender  Eyes:    PERRL, conjunctiva/corneas clear, EOM's intact       Lungs:     Clear to auscultation bilaterally, respirations unlabored  Heart:    Regular rate and rhythm  Neurologic:   Awake, alert, oriented x 3. No apparent focal neurological           defect.       Results for orders placed or performed in visit on 06/07/17  POCT Influenza A/B  Result Value Ref Range   Influenza A, POC Negative Negative   Influenza B, POC Negative Negative  POCT rapid strep A  Result Value Ref Range   Rapid Strep A Screen Negative Negative       Assessment & Plan:     1. Exposure to the flu prophylacti- oseltamivir (TAMIFLU) 75 MG capsule; Take 1 capsule (75 mg total) by mouth daily for 10 days.  Dispense: 10 capsule; Refill: 0 - POCT Influenza A/B  2. Sore throat  - POCT rapid strep A  3. Right hip pain  - Ambulatory referral to Orthopedic Surgery       Mila Merryonald Hareem Surowiec, MD  [facility]

## 2017-07-20 LAB — HM MAMMOGRAPHY

## 2017-07-21 ENCOUNTER — Encounter: Payer: Self-pay | Admitting: *Deleted

## 2017-08-04 ENCOUNTER — Other Ambulatory Visit: Payer: Self-pay | Admitting: Family Medicine

## 2017-08-04 NOTE — Telephone Encounter (Signed)
Pharmacy requesting refills. Thanks!  

## 2017-08-15 ENCOUNTER — Other Ambulatory Visit: Payer: Self-pay | Admitting: Family Medicine

## 2017-08-22 ENCOUNTER — Other Ambulatory Visit: Payer: Self-pay | Admitting: Family Medicine

## 2017-08-22 DIAGNOSIS — E1100 Type 2 diabetes mellitus with hyperosmolarity without nonketotic hyperglycemic-hyperosmolar coma (NKHHC): Secondary | ICD-10-CM

## 2017-08-22 DIAGNOSIS — I10 Essential (primary) hypertension: Secondary | ICD-10-CM

## 2017-08-26 ENCOUNTER — Other Ambulatory Visit: Payer: Self-pay | Admitting: Family Medicine

## 2017-08-26 DIAGNOSIS — E1100 Type 2 diabetes mellitus with hyperosmolarity without nonketotic hyperglycemic-hyperosmolar coma (NKHHC): Secondary | ICD-10-CM

## 2017-08-27 ENCOUNTER — Other Ambulatory Visit: Payer: Self-pay | Admitting: Family Medicine

## 2017-08-28 ENCOUNTER — Other Ambulatory Visit: Payer: Self-pay | Admitting: Family Medicine

## 2017-08-30 ENCOUNTER — Telehealth: Payer: Self-pay | Admitting: *Deleted

## 2017-08-30 DIAGNOSIS — Z122 Encounter for screening for malignant neoplasm of respiratory organs: Secondary | ICD-10-CM

## 2017-08-30 DIAGNOSIS — Z87891 Personal history of nicotine dependence: Secondary | ICD-10-CM

## 2017-08-30 NOTE — Telephone Encounter (Signed)
Notified patient that annual lung cancer screening low dose CT scan is due currently or will be in near future. Confirmed that patient is within the age range of 55-77, and asymptomatic, (no signs or symptoms of lung cancer). Patient denies illness that would prevent curative treatment for lung cancer if found. Verified smoking history, (current, 84.5 pack year). The shared decision making visit was done 09/07/16. Patient is agreeable for CT scan being scheduled.

## 2017-08-31 ENCOUNTER — Telehealth: Payer: Self-pay | Admitting: *Deleted

## 2017-08-31 NOTE — Telephone Encounter (Signed)
Pt notified via VM and appt details mailed. °

## 2017-09-06 ENCOUNTER — Other Ambulatory Visit: Payer: Self-pay | Admitting: Family Medicine

## 2017-09-06 NOTE — Telephone Encounter (Signed)
[facility] pharmacy faxed a refill request for the following medication. Thanks CC  lovastatin (MEVACOR) 20 MG tablet

## 2017-09-06 NOTE — Telephone Encounter (Signed)
This rx was sent to pharmacy 08/04/2017.pharmacy confirmed.

## 2017-09-15 ENCOUNTER — Ambulatory Visit
Admission: RE | Admit: 2017-09-15 | Discharge: 2017-09-15 | Disposition: A | Discharge status: Home / Self Care | Payer: BC Managed Care – PPO | Source: Ambulatory Visit | Attending: Nurse Practitioner | Admitting: Nurse Practitioner

## 2017-09-15 DIAGNOSIS — Z87891 Personal history of nicotine dependence: Secondary | ICD-10-CM | POA: Diagnosis not present

## 2017-09-15 DIAGNOSIS — Z122 Encounter for screening for malignant neoplasm of respiratory organs: Secondary | ICD-10-CM | POA: Diagnosis present

## 2017-09-15 DIAGNOSIS — I700 Atherosclerosis of aorta: Secondary | ICD-10-CM | POA: Diagnosis not present

## 2017-09-15 DIAGNOSIS — J439 Emphysema, unspecified: Secondary | ICD-10-CM | POA: Diagnosis not present

## 2017-09-19 ENCOUNTER — Encounter: Payer: Self-pay | Admitting: *Deleted

## 2017-10-28 ENCOUNTER — Other Ambulatory Visit: Payer: Self-pay | Admitting: Family Medicine

## 2017-10-28 DIAGNOSIS — J069 Acute upper respiratory infection, unspecified: Secondary | ICD-10-CM

## 2017-11-20 ENCOUNTER — Other Ambulatory Visit: Payer: Self-pay | Admitting: Family Medicine

## 2017-11-20 DIAGNOSIS — I10 Essential (primary) hypertension: Secondary | ICD-10-CM

## 2017-11-20 DIAGNOSIS — E1100 Type 2 diabetes mellitus with hyperosmolarity without nonketotic hyperglycemic-hyperosmolar coma (NKHHC): Secondary | ICD-10-CM

## 2017-11-26 ENCOUNTER — Other Ambulatory Visit: Payer: Self-pay | Admitting: Family Medicine

## 2017-11-26 DIAGNOSIS — J069 Acute upper respiratory infection, unspecified: Secondary | ICD-10-CM

## 2018-02-06 LAB — HM DIABETES EYE EXAM

## 2018-03-01 ENCOUNTER — Other Ambulatory Visit: Payer: Self-pay | Admitting: Family Medicine

## 2018-03-01 DIAGNOSIS — E1100 Type 2 diabetes mellitus with hyperosmolarity without nonketotic hyperglycemic-hyperosmolar coma (NKHHC): Secondary | ICD-10-CM

## 2018-03-01 DIAGNOSIS — I10 Essential (primary) hypertension: Secondary | ICD-10-CM

## 2018-03-01 NOTE — Telephone Encounter (Signed)
Apt made for 03/13/2018 at 4pm  Thanks,   -Vernona RiegerLaura

## 2018-03-01 NOTE — Telephone Encounter (Signed)
Patient has had diabetes follow up in over a year. Needs office visit before refill can be approved.

## 2018-03-02 ENCOUNTER — Encounter: Payer: Self-pay | Admitting: Family Medicine

## 2018-03-06 ENCOUNTER — Other Ambulatory Visit: Payer: Self-pay | Admitting: Family Medicine

## 2018-03-13 ENCOUNTER — Encounter: Payer: Self-pay | Admitting: Family Medicine

## 2018-03-13 ENCOUNTER — Ambulatory Visit: Payer: BC Managed Care – PPO | Admitting: Family Medicine

## 2018-03-13 VITALS — BP 102/69 | HR 90 | Temp 98.8°F | Resp 16 | Wt 116.0 lb

## 2018-03-13 DIAGNOSIS — E1100 Type 2 diabetes mellitus with hyperosmolarity without nonketotic hyperglycemic-hyperosmolar coma (NKHHC): Secondary | ICD-10-CM

## 2018-03-13 DIAGNOSIS — R252 Cramp and spasm: Secondary | ICD-10-CM

## 2018-03-13 DIAGNOSIS — Z23 Encounter for immunization: Secondary | ICD-10-CM | POA: Diagnosis not present

## 2018-03-13 DIAGNOSIS — Z1159 Encounter for screening for other viral diseases: Secondary | ICD-10-CM | POA: Diagnosis not present

## 2018-03-13 DIAGNOSIS — E1165 Type 2 diabetes mellitus with hyperglycemia: Secondary | ICD-10-CM

## 2018-03-13 DIAGNOSIS — F17210 Nicotine dependence, cigarettes, uncomplicated: Secondary | ICD-10-CM | POA: Diagnosis not present

## 2018-03-13 DIAGNOSIS — E559 Vitamin D deficiency, unspecified: Secondary | ICD-10-CM

## 2018-03-13 DIAGNOSIS — I10 Essential (primary) hypertension: Secondary | ICD-10-CM

## 2018-03-13 DIAGNOSIS — E785 Hyperlipidemia, unspecified: Secondary | ICD-10-CM

## 2018-03-13 LAB — POCT GLYCOSYLATED HEMOGLOBIN (HGB A1C)
ESTIMATED AVERAGE GLUCOSE: 174
HEMOGLOBIN A1C: 7.7 % — AB (ref 4.0–5.6)

## 2018-03-13 LAB — POCT UA - MICROALBUMIN: Microalbumin Ur, POC: 20 mg/L

## 2018-03-13 MED ORDER — BENAZEPRIL-HYDROCHLOROTHIAZIDE 10-12.5 MG PO TABS: 0.5000 | ORAL_TABLET | Freq: Every day | ORAL | 0 refills | Status: DC

## 2018-03-13 NOTE — Patient Instructions (Addendum)
.   Please bring all of your medications to every appointment so we can make sure that our medication list is the same as yours.    Please stop smoking   Try OTC Coenzyme Q10 200 mg once a day to see it will help with cramps in legs.

## 2018-03-13 NOTE — Progress Notes (Signed)
Patient: Dana Weber Female    DOB: 02/08/1961   57 y.o.   MRN: [id number] Visit Date: 03/13/2018  Today's Provider: Mila Merryonald Fisher, MD   Chief Complaint  Patient presents with  . Diabetes  . Pain   Subjective:     HPI   Diabetes Mellitus Type II, Follow-up:   Lab Results  Component Value Date   HGBA1C 7.5 04/30/2016   HGBA1C 7.5% 04/30/2016   HGBA1C 8.3 (H) 08/15/2015    Last seen for diabetes 1 years ago.  Management since then includes no changes. She reports good compliance with treatment. She is not having side effects.  Current symptoms include polydipsia and tingling in feet and hands and have been stable. Home blood sugar records: blood sugars are not being checked  Episodes of hypoglycemia? no   Current Insulin Regimen: none Most Recent Eye Exam: < 1 year ago Weight trend: decreasing steadily Prior visit with dietician: no Current diet: well balanced Current exercise: walking  Pertinent Labs:    Component Value Date/Time   CHOL 185 08/15/2015 0924   TRIG 150 (H) 08/15/2015 0924   HDL 46 08/15/2015 0924   LDLCALC 109 (H) 08/15/2015 0924   CREATININE 0.66 05/03/2016 0806    Wt Readings from Last 3 Encounters:  03/13/18 116 lb (52.6 kg)  09/15/17 118 lb (53.5 kg)  05/20/17 124 lb (56.2 kg)    ------------------------------------------------------------------------ Chronic Neck pain:  Patient was last seen for this problem 1 year ago. 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 Patient was prescribed Flexeril. Today patient reports this problem is stable. She states she has been out of Flexeril for several months.  BP Readings from Last 3 Encounters:  03/13/18 102/69  06/07/17 116/70  05/20/17 110/80      Allergies  Allergen Reactions  . Amoxicillin Swelling    Facial, and itching  . Hydrocodone Bitartrate Swelling     Current Outpatient Medications:    .  albuterol (PROVENTIL HFA;VENTOLIN HFA) 108 (90 Base) MCG/ACT inhaler, INHALE 2 PUFFS BY MOUTH EVERY 6 HOURS AS NEEDED FOR WHEEZING OR SHORTNESS OF BREATH, Disp: 8.5 g, Rfl: 5 .  benazepril-hydrochlorthiazide (LOTENSIN HCT) 10-12.5 MG tablet, TAKE 1 TABLET BY MOUTH DAILY., Disp: 30 tablet, Rfl: 12 .  cyclobenzaprine (FLEXERIL) 5 MG tablet, Take 1-2 tablets (5-10 mg total) by mouth 3 (three) times daily as needed (shoulder pain)., Disp: 30 tablet, Rfl: 1 .  FARXIGA 5 MG TABS tablet, TAKE 1 TABLET BY MOUTH EVERY DAY, Disp: 90 tablet, Rfl: 0 .  JANUVIA 100 MG tablet, TAKE 1 TABLET BY MOUTH EVERY DAY, Disp: 90 tablet, Rfl: 3 .  lovastatin (MEVACOR) 20 MG tablet, TAKE 1 TABLET BY MOUTH EVERY DAY, Disp: 90 tablet, Rfl: 1 .  meloxicam (MOBIC) 7.5 MG tablet, Take 1-2 tablets (7.5-15 mg total) by mouth daily as needed for pain., Disp: 30 tablet, Rfl: 4 .  metFORMIN (GLUCOPHAGE-XR) 500 MG 24 hr tablet, TAKE 2 TABLETS BY MOUTH TWICE A DAY, Disp: 360 tablet, Rfl: 1 .  PARoxetine (PAXIL) 20 MG tablet, TAKE 1 TABLET BY MOUTH DAILY, Disp: 90 tablet, Rfl: 3  Review of Systems  Constitutional: Negative for appetite change, chills, fatigue and fever.  Respiratory: Negative for chest tightness and shortness of breath.   Cardiovascular: Negative for chest pain and palpitations.  Gastrointestinal: Negative for abdominal pain, nausea and vomiting.  Endocrine: Positive for polydipsia.  Neurological: Negative for dizziness and weakness.       Tingling in feet and hands    Social History   Tobacco Use  . Smoking status: Current Some Day Smoker    Years: 42.00    Types: Cigarettes  . Smokeless tobacco: Never Used  . Tobacco comment: has a cigarette every few days  Substance Use Topics  . Alcohol use: Yes    Alcohol/week: 0.0 standard drinks    Comment: OCCASIONALLY      Objective:   BP 102/69 (BP Location: Left Arm, Patient Position: Sitting, Cuff Size: Normal)   Pulse 90   Temp 98.8 F (37.1 C)  (Oral)   Resp 16   Wt 116 lb (52.6 kg)   SpO2 97% Comment: room air  BMI 22.65 kg/m  Vitals:   03/13/18 1542  BP: 102/69  Pulse: 90  Resp: 16  Temp: 98.8 F (37.1 C)  TempSrc: Oral  SpO2: 97%  Weight: 116 lb (52.6 kg)     Physical Exam   General Appearance:    Alert, cooperative, no distress  Eyes:    PERRL, conjunctiva/corneas clear, EOM's intact       Lungs:     Clear to auscultation bilaterally, respirations unlabored  Heart:    Regular rate and rhythm  Neurologic:   Awake, alert, oriented x 3. No apparent focal neurological           defect.       Results for orders placed or performed in visit on 03/13/18  POCT HgB A1C  Result Value Ref Range   Hemoglobin A1C 7.7 (A) 4.0 - 5.6 %   HbA1c POC (<> result, manual entry)     HbA1c, POC (prediabetic range)     HbA1c, POC (controlled diabetic range)     Est. average glucose Bld gHb Est-mCnc 174   POCT UA - Microalbumin  Result Value Ref Range   Microalbumin Ur, POC 20 mg/L   Creatinine, POC n/a mg/dL   Albumin/Creatinine Ratio, Urine, POC n/a        Assessment & Plan    1. Uncontrolled type 2 diabetes mellitus with hyperosmolarity without coma, without long-term current use of insulin (HCC) Work on diet. Continue current medications.   - POCT HgB A1C - POCT UA - Microalbumin  2. Need for hepatitis C screening test  - Hepatitis C antibody  3. Smoking greater than 30 pack years Encouraged to stop smoking completely   4. Avitaminosis D  - VITAMIN D 25 Hydroxy (Vit-D Deficiency, Fractures)  5. Hyperlipidemia, unspecified hyperlipidemia type She is tolerating lovastatin well with no adverse effects.   - Comprehensive metabolic panel - Lipid panel  6. Essential (primary) hypertension Can reduce- benazepril-hydrochlorthiazide (LOTENSIN HCT) 10-12.5 MG tablet to 1/2 tablet daily - Magnesium  7. Cramp in lower leg Discussed various etiologies including statin use. If labs normal will have her try Coq10 -  Magnesium  Shingrix #1 today.      Mila Merryonald Fisher, MD  [facility]

## 2018-03-18 LAB — LIPID PANEL
CHOL/HDL RATIO: 3 ratio (ref 0.0–4.4)
CHOLESTEROL TOTAL: 125 mg/dL (ref 100–199)
HDL: 41 mg/dL (ref >39)
LDL Calculated: 66 mg/dL (ref 0–99)
TRIGLYCERIDES: 91 mg/dL (ref 0–149)
VLDL Cholesterol Cal: 18 mg/dL (ref 5–40)

## 2018-03-18 LAB — COMPREHENSIVE METABOLIC PANEL
ALK PHOS: 63 IU/L (ref 39–117)
ALT: 26 IU/L (ref 0–32)
AST: 17 IU/L (ref 0–40)
Albumin/Globulin Ratio: 2 (ref 1.2–2.2)
Albumin: 4 g/dL (ref 3.5–5.5)
BUN/Creatinine Ratio: 15 (ref 9–23)
BUN: 10 mg/dL (ref 6–24)
Bilirubin Total: 0.4 mg/dL (ref 0.0–1.2)
CALCIUM: 9.4 mg/dL (ref 8.7–10.2)
CO2: 23 mmol/L (ref 20–29)
CREATININE: 0.65 mg/dL (ref 0.57–1.00)
Chloride: 98 mmol/L (ref 96–106)
GFR calc Af Amer: 115 mL/min/{1.73_m2} (ref >59)
GFR calc non Af Amer: 100 mL/min/{1.73_m2} (ref >59)
Globulin, Total: 2 g/dL (ref 1.5–4.5)
Glucose: 185 mg/dL — ABNORMAL HIGH (ref 65–99)
Potassium: 4.1 mmol/L (ref 3.5–5.2)
Sodium: 141 mmol/L (ref 134–144)
Total Protein: 6 g/dL (ref 6.0–8.5)

## 2018-03-18 LAB — HEPATITIS C ANTIBODY

## 2018-03-18 LAB — VITAMIN D 25 HYDROXY (VIT D DEFICIENCY, FRACTURES): VIT D 25 HYDROXY: 26.3 ng/mL — AB (ref 30.0–100.0)

## 2018-03-18 LAB — MAGNESIUM: MAGNESIUM: 1.7 mg/dL (ref 1.6–2.3)

## 2018-03-21 ENCOUNTER — Telehealth: Payer: Self-pay

## 2018-03-21 NOTE — Telephone Encounter (Signed)
-----   Message from Malva Limesonald E Fisher, MD sent at 03/20/2018  1:16 PM EST ----- Labs are all normal. Cholesterol well controlled at 125. Continue current medications.  Follow up in April as scheduled.

## 2018-03-21 NOTE — Telephone Encounter (Signed)
Pt advised.   Thanks,   -Berlinda Farve  

## 2018-05-23 ENCOUNTER — Other Ambulatory Visit: Payer: Self-pay | Admitting: Family Medicine

## 2018-05-23 DIAGNOSIS — I10 Essential (primary) hypertension: Secondary | ICD-10-CM

## 2018-05-23 DIAGNOSIS — E1100 Type 2 diabetes mellitus with hyperosmolarity without nonketotic hyperglycemic-hyperosmolar coma (NKHHC): Secondary | ICD-10-CM

## 2018-06-06 ENCOUNTER — Encounter: Payer: Self-pay | Admitting: Family Medicine

## 2018-06-06 ENCOUNTER — Ambulatory Visit: Payer: BC Managed Care – PPO | Admitting: Family Medicine

## 2018-06-06 ENCOUNTER — Other Ambulatory Visit: Payer: Self-pay

## 2018-06-06 VITALS — BP 119/75 | HR 97 | Temp 99.0°F | Resp 16 | Wt 115.0 lb

## 2018-06-06 DIAGNOSIS — I493 Ventricular premature depolarization: Secondary | ICD-10-CM | POA: Insufficient documentation

## 2018-06-06 DIAGNOSIS — G44229 Chronic tension-type headache, not intractable: Secondary | ICD-10-CM | POA: Diagnosis not present

## 2018-06-06 DIAGNOSIS — I499 Cardiac arrhythmia, unspecified: Secondary | ICD-10-CM

## 2018-06-06 NOTE — Patient Instructions (Signed)
Premature Ventricular Contraction  A premature ventricular contraction (PVC) is a common kind of irregular heartbeat (arrhythmia). These contractions are extra heartbeats that start in the ventricles of the heart and occur too early in the normal sequence. During the PVC, the heart's normal electrical pathway is not used, so the beat is shorter and less effective. In most cases, these contractions come and go and do not require treatment. What are the causes? Common causes of the condition include: Smoking. Drinking alcohol. Certain medicines. Some illegal drugs. Stress. Caffeine. Certain medical conditions can also cause PVCs: Heart failure. Heart attack, or coronary artery disease. Heart valve problems. Changes in minerals in the blood (electrolytes). Low blood oxygen levels or high carbon dioxide levels. In many cases, the cause of this condition is not known. What are the signs or symptoms? The main symptom of this condition is fast or skipped heartbeats (palpitations). Other symptoms include: Chest pain. Shortness of breath. Feeling tired. Dizziness. Difficulty exercising. In some cases, there are no symptoms. How is this diagnosed? This condition may be diagnosed based on: Your medical history. A physical exam. During the exam, the health care provider will check for irregular heartbeats. Tests, such as: An ECG (electrocardiogram) to monitor the electrical activity of your heart. Ambulatory cardiac monitor. This device records your heartbeats for 24 hours or more. Stress tests to see how exercise affects your heart rhythm and blood supply. Echocardiogram. This test uses sound waves (ultrasound) to produce an image of your heart. Electrophysiology study (EPS). This test checks for electrical problems in your heart. How is this treated? Treatment for this condition depends on any underlying conditions, the type of PVCs that you are having, and how much the symptoms are  interfering with your daily life. Possible treatments include: Avoiding things that cause premature contractions (triggers). These include caffeine and alcohol. Taking medicines if symptoms are severe or if the extra heartbeats are frequent. Getting treatment for underlying conditions that cause PVCs. Having an implantable cardioverter defibrillator (ICD), if you are at risk for a serious arrhythmia. The ICD is a small device that is inserted into your chest to monitor your heartbeat. When it senses an irregular heartbeat, it sends a shock to bring the heartbeat back to normal. Having a procedure to destroy the portion of the heart tissue that sends out abnormal signals (catheter ablation). In some cases, no treatment is required. Follow these instructions at home: Alcohol use  Do not drink alcohol if: Your health care provider tells you not to drink. You are pregnant, may be pregnant, or are planning to become pregnant. Alcohol triggers your episodes. If you drink alcohol, limit how much you have. You may drink: 0-1 drink a day for women. 0-2 drinks a day for men. Be aware of how much alcohol is in your drink. In the [place] one drink equals one typical bottle of beer (12 oz), one-half glass of wine (5 oz), or one shot of hard liquor (1 oz). General instructions Do not use any products that contain nicotine or tobacco, such as cigarettes and e-cigarettes. If you need help quitting, ask your health care provider. Find healthy ways to manage stress. Avoid stressful situations when possible. Exercise regularly. Ask your health care provider what type of exercise is safe for you. Try to get at least 7-9 hours of sleep each night, or as much as recommended by your health care provider. If caffeine triggers episodes of PVC, do not eat, drink, or use anything with caffeine in it.  Do not use illegal drugs. Take over-the-counter and prescription medicines only as told by your health care provider.  Keep all follow-up visits as told by your health care provider. This is important. Contact a health care provider if you: Feel palpitations. Get help right away if you: Have chest pain. Have shortness of breath. Have sweating for no reason. Have nausea and vomiting. Become light-headed or you faint. Summary A premature ventricular contraction (PVC) is a common kind of irregular heartbeat (arrhythmia). In most cases, these contractions come and go and do not require treatment. You may need to wear an ambulatory cardiac monitor. This records your heartbeats for 24 hours or more. Treatment depends on any underlying conditions, the type of PVCs that you are having, and how much the symptoms are interfering with your daily life. This information is not intended to replace advice given to you by your health care provider. Make sure you discuss any questions you have with your health care provider. Document Released: 10/24/2003 Document Revised: 10/21/2017 Document Reviewed: 04/26/2017 Elsevier Interactive Patient Education  [employer]. . Please review the attached list of medications and notify my office if there are any errors.   . Please bring all of your medications to every appointment so we can make sure that our medication list is the same as yours.

## 2018-06-06 NOTE — Progress Notes (Signed)
Patient: Dana Weber Female    DOB: 1960/11/05   58 y.o.   MRN: [id number] Visit Date: 06/06/2018  Today's Provider: Mila Merry, MD   Chief Complaint  Patient presents with  . Irregular Heart Beat   Subjective:     HPI  Irregular Heart Beat: Patient comes in complaining of Irregular heart beats. She sates that this has occurred intermittently for several years, but has recently occurred more regularly within the past few weeks. Patient also complains of shortness of breath and  Headaches nearly every day No chest pains. She does drink a cup of coffee twice a day, but no more than in the past. Has been taking Tylenol nearly every day for headaches the last several weeks no Excedrin or other medications containing caffeine. Denies use of allergy or decongestant medications. Has been more fatigued that usual. No other recent medication or dietary changes.  Headaches migrate from side to side, pounding in nature. No auroa. + photophobia.   Allergies  Allergen Reactions  . Amoxicillin Swelling    Facial, and itching  . Hydrocodone Bitartrate Er Swelling     Current Outpatient Medications:  .  albuterol (PROVENTIL HFA;VENTOLIN HFA) 108 (90 Base) MCG/ACT inhaler, INHALE 2 PUFFS BY MOUTH EVERY 6 HOURS AS NEEDED FOR WHEEZING OR SHORTNESS OF BREATH, Disp: 8.5 g, Rfl: 5 .  benazepril-hydrochlorthiazide (LOTENSIN HCT) 10-12.5 MG tablet, Take 0.5 tablets by mouth daily., Disp: 3 tablet, Rfl: 0 .  FARXIGA 5 MG TABS tablet, TAKE 1 TABLET BY MOUTH EVERY DAY, Disp: 90 tablet, Rfl: 1 .  JANUVIA 100 MG tablet, TAKE 1 TABLET BY MOUTH EVERY DAY, Disp: 90 tablet, Rfl: 3 .  lovastatin (MEVACOR) 20 MG tablet, TAKE 1 TABLET BY MOUTH EVERY DAY, Disp: 90 tablet, Rfl: 1 .  meloxicam (MOBIC) 7.5 MG tablet, Take 1-2 tablets (7.5-15 mg total) by mouth daily as needed for pain., Disp: 30 tablet, Rfl: 4 .  metFORMIN (GLUCOPHAGE-XR) 500 MG 24 hr tablet, TAKE 2 TABLETS BY MOUTH TWICE A DAY, Disp:  360 tablet, Rfl: 1 .  PARoxetine (PAXIL) 20 MG tablet, TAKE 1 TABLET BY MOUTH DAILY, Disp: 90 tablet, Rfl: 3 .  cyclobenzaprine (FLEXERIL) 5 MG tablet, Take 1-2 tablets (5-10 mg total) by mouth 3 (three) times daily as needed (shoulder pain). (Patient not taking: Reported on 06/06/2018), Disp: 30 tablet, Rfl: 1  Review of Systems  Constitutional: Negative for appetite change, chills, fatigue and fever.  Respiratory: Positive for shortness of breath and wheezing. Negative for chest tightness.   Cardiovascular: Positive for palpitations (irregualr heart beat). Negative for chest pain.  Gastrointestinal: Negative for abdominal pain, nausea and vomiting.  Neurological: Positive for headaches. Negative for dizziness and weakness.    Social History   Tobacco Use  . Smoking status: Current Some Day Smoker    Packs/day: 0.75    Years: 42.00    Pack years: 31.50    Types: Cigarettes  . Smokeless tobacco: Never Used  . Tobacco comment: has a cigarette every few days  Substance Use Topics  . Alcohol use: Yes    Alcohol/week: 0.0 standard drinks    Comment: OCCASIONALLY      Objective:   BP 119/75 (BP Location: Right Arm, Patient Position: Sitting, Cuff Size: Normal)   Pulse 97   Temp 99 F (37.2 C) (Oral)   Resp 16   Wt 115 lb (52.2 kg)   SpO2 98% Comment: room air  BMI 22.46 kg/m  Vitals:  06/06/18 1630  BP: 119/75  Pulse: 97  Resp: 16  Temp: 99 F (37.2 C)  TempSrc: Oral  SpO2: 98%  Weight: 115 lb (52.2 kg)     Physical Exam   General Appearance:    Alert, cooperative, no distress  Eyes:    PERRL, conjunctiva/corneas clear, EOM's intact       Lungs:     Clear to auscultation bilaterally, respirations unlabored  Heart:    Regular rate and rhythm with frequent premature beats.   Neurologic:   Awake, alert, oriented x 3. No apparent focal neurological           defect.      EKG. Frequent PEVB.      Assessment & Plan    1. Irregular heart rhythm  - EKG 12-Lead  - Comprehensive metabolic panel - TSH - Magnesium  2. Frequent PVCs Counseled to avoid OTC stimulants such as caffeine and to reduce  Coffee intake.  - Comprehensive metabolic panel - TSH - Magnesium  3. Chronic tension-type headache, not intractable Not new, but worsening.   She is reluctant to start any new medications. Advised betablocker may help with both headaches and PVCs. See how labs look first.      Mila Merry, MD  [facility]

## 2018-06-08 ENCOUNTER — Telehealth: Payer: Self-pay

## 2018-06-08 LAB — COMPREHENSIVE METABOLIC PANEL
ALK PHOS: 60 IU/L (ref 39–117)
ALT: 17 IU/L (ref 0–32)
AST: 17 IU/L (ref 0–40)
Albumin/Globulin Ratio: 2.6 — ABNORMAL HIGH (ref 1.2–2.2)
Albumin: 4.5 g/dL (ref 3.8–4.9)
BUN/Creatinine Ratio: 17 (ref 9–23)
BUN: 11 mg/dL (ref 6–24)
Bilirubin Total: 0.3 mg/dL (ref 0.0–1.2)
CO2: 20 mmol/L (ref 20–29)
Calcium: 9.4 mg/dL (ref 8.7–10.2)
Chloride: 102 mmol/L (ref 96–106)
Creatinine, Ser: 0.64 mg/dL (ref 0.57–1.00)
GFR calc Af Amer: 115 mL/min/{1.73_m2} (ref >59)
GFR calc non Af Amer: 99 mL/min/{1.73_m2} (ref >59)
Globulin, Total: 1.7 g/dL (ref 1.5–4.5)
Glucose: 115 mg/dL — ABNORMAL HIGH (ref 65–99)
Potassium: 4.3 mmol/L (ref 3.5–5.2)
SODIUM: 142 mmol/L (ref 134–144)
Total Protein: 6.2 g/dL (ref 6.0–8.5)

## 2018-06-08 LAB — MAGNESIUM: Magnesium: 1.7 mg/dL (ref 1.6–2.3)

## 2018-06-08 LAB — TSH: TSH: 1.21 u[IU]/mL (ref 0.450–4.500)

## 2018-06-08 NOTE — Telephone Encounter (Signed)
LMTCB 06/08/2018  Thanks,   -Eward Rutigliano  

## 2018-06-08 NOTE — Telephone Encounter (Signed)
-----   Message from Malva Limes, MD sent at 06/08/2018 11:52 AM EDT ----- Slightly low magnesium, rest of labs are normal. Magnesium supplement will often help with palpitations. Recommend she start OTC magnesium 0xide 500mg  twice a day. Can cut back to one a day when palpitations get better.

## 2018-06-08 NOTE — Telephone Encounter (Signed)
Pt advised.   Thanks,   -Charnelle Bergeman  

## 2018-07-14 ENCOUNTER — Ambulatory Visit: Payer: Self-pay | Admitting: Family Medicine

## 2018-07-16 ENCOUNTER — Other Ambulatory Visit: Payer: Self-pay | Admitting: Family Medicine

## 2018-07-16 DIAGNOSIS — J069 Acute upper respiratory infection, unspecified: Secondary | ICD-10-CM

## 2018-07-29 ENCOUNTER — Other Ambulatory Visit: Payer: Self-pay | Admitting: Family Medicine

## 2018-08-13 ENCOUNTER — Other Ambulatory Visit: Payer: Self-pay | Admitting: Family Medicine

## 2018-08-15 ENCOUNTER — Other Ambulatory Visit: Payer: Self-pay | Admitting: Family Medicine

## 2018-08-15 DIAGNOSIS — I10 Essential (primary) hypertension: Secondary | ICD-10-CM

## 2018-08-27 ENCOUNTER — Other Ambulatory Visit: Payer: Self-pay | Admitting: Family Medicine

## 2018-08-29 ENCOUNTER — Encounter: Payer: Self-pay | Admitting: Family Medicine

## 2018-08-29 ENCOUNTER — Ambulatory Visit: Payer: Self-pay | Admitting: Family Medicine

## 2018-08-29 ENCOUNTER — Ambulatory Visit: Payer: BC Managed Care – PPO | Admitting: Family Medicine

## 2018-08-29 ENCOUNTER — Other Ambulatory Visit: Payer: Self-pay

## 2018-08-29 VITALS — BP 140/70 | HR 84 | Temp 98.8°F | Resp 18 | Wt 113.0 lb

## 2018-08-29 DIAGNOSIS — Z23 Encounter for immunization: Secondary | ICD-10-CM

## 2018-08-29 DIAGNOSIS — E119 Type 2 diabetes mellitus without complications: Secondary | ICD-10-CM

## 2018-08-29 DIAGNOSIS — N390 Urinary tract infection, site not specified: Secondary | ICD-10-CM | POA: Diagnosis not present

## 2018-08-29 DIAGNOSIS — R3915 Urgency of urination: Secondary | ICD-10-CM

## 2018-08-29 DIAGNOSIS — M797 Fibromyalgia: Secondary | ICD-10-CM | POA: Diagnosis not present

## 2018-08-29 LAB — POCT GLYCOSYLATED HEMOGLOBIN (HGB A1C)
Est. average glucose Bld gHb Est-mCnc: 166
Hemoglobin A1C: 7.4 % — AB (ref 4.0–5.6)

## 2018-08-29 LAB — POCT URINALYSIS DIPSTICK
Bilirubin, UA: NEGATIVE
Glucose, UA: POSITIVE — AB
Leukocytes, UA: NEGATIVE
Nitrite, UA: NEGATIVE
Protein, UA: POSITIVE — AB
Spec Grav, UA: 1.025 (ref 1.010–1.025)
Urobilinogen, UA: 0.2 E.U./dL
pH, UA: 6 (ref 5.0–8.0)

## 2018-08-29 MED ORDER — CIPROFLOXACIN HCL 500 MG PO TABS: 500.0000 mg | ORAL_TABLET | Freq: Two times a day (BID) | ORAL | 0 refills | Status: AC

## 2018-08-29 MED ORDER — GABAPENTIN 300 MG PO CAPS: 300.0000 mg | ORAL_CAPSULE | Freq: Every day | ORAL | 3 refills | Status: DC

## 2018-08-29 NOTE — Progress Notes (Signed)
Patient: Dana Weber Female    DOB: October 07, 1960   58 y.o.   MRN: [id number] Visit Date: 08/29/2018  Today's Provider: Lelon Huh, MD   Chief Complaint  Patient presents with  . Diabetes  . Hypertension  . Urinary Urgency   Subjective:     HPI  Diabetes Mellitus Type II, Follow-up:   Lab Results  Component Value Date   HGBA1C 7.7 (A) 03/13/2018   HGBA1C 7.5 04/30/2016   HGBA1C 7.5% 04/30/2016    Last seen for diabetes 6 months ago.  Management since then includes no changes. She reports good compliance with treatment. She is not having side effects.  Current symptoms include polydipsia and polyuria and have been stable. Home blood sugar records: blood sugars are not checked  Episodes of hypoglycemia? no   Current Insulin Regimen: none Most Recent Eye Exam: <1 year ago Weight trend: stable Prior visit with dietician: No Current exercise: walking Current diet habits: well balanced  Pertinent Labs:    Component Value Date/Time   CHOL 125 03/17/2018 0805   TRIG 91 03/17/2018 0805   HDL 41 03/17/2018 0805   LDLCALC 66 03/17/2018 0805   CREATININE 0.64 06/07/2018 1553    Wt Readings from Last 3 Encounters:  08/29/18 113 lb (51.3 kg)  06/06/18 115 lb (52.2 kg)  03/13/18 116 lb (52.6 kg)    ------------------------------------------------------------------------  Hypertension, follow-up:  BP Readings from Last 3 Encounters:  08/29/18 140/70  06/06/18 119/75  03/13/18 102/69    She was last seen for hypertension 6 months ago.  BP at that visit was 102/69. Management since that visit includes reducing Benazepril-HCTZ to 1/2 tablet of 10-12.5mg  daily. She reports good compliance with treatment. She is not having side effects.  She is exercising. She is not adherent to low salt diet.   Outside blood pressures are not checked. She is experiencing none.  Patient denies chest pain, chest pressure/discomfort, claudication, dyspnea,  exertional chest pressure/discomfort, fatigue, irregular heart beat, lower extremity edema, near-syncope, orthopnea, palpitations, paroxysmal nocturnal dyspnea, syncope and tachypnea.   Cardiovascular risk factors include diabetes mellitus and hypertension.  Use of agents associated with hypertension: none.     Weight trend: stable Wt Readings from Last 3 Encounters:  08/29/18 113 lb (51.3 kg)  06/06/18 115 lb (52.2 kg)  03/13/18 116 lb (52.6 kg)    Current diet: well balanced  ------------------------------------------------------------------------  Follow up for Low Magnesium:  The patient was last seen for this 3 months ago. Changes made at last visit include starting OTC Magnesium Oxide. She was having frequent palpations at the time. She started taking two a day and palpitatations resolved, has since reduced to one daily.   She reports good compliance with treatment. She feels that condition is stable. She is not having side effects.   ------------------------------------------------------------------------------------  complains of muscle hurting all over all daylong. Does not sleep well. Thinks she may have fibromyalgia  Allergies  Allergen Reactions  . Amoxicillin Swelling    Facial, and itching  . Hydrocodone Bitartrate Er Swelling     Current Outpatient Medications:  .  albuterol (VENTOLIN HFA) 108 (90 Base) MCG/ACT inhaler, INHALE 2 PUFFS BY MOUTH EVERY 6 HOURS AS NEEDED FOR WHEEZING OR SHORTNESS OF BREATH, Disp: 8.5 g, Rfl: 5 .  benazepril-hydrochlorthiazide (LOTENSIN HCT) 10-12.5 MG tablet, TAKE 1 TABLET BY MOUTH DAILY., Disp: 90 tablet, Rfl: 3 .  cyclobenzaprine (FLEXERIL) 5 MG tablet, Take 1-2 tablets (5-10 mg total) by mouth  3 (three) times daily as needed (shoulder pain)., Disp: 30 tablet, Rfl: 1 .  FARXIGA 5 MG TABS tablet, TAKE 1 TABLET BY MOUTH EVERY DAY, Disp: 90 tablet, Rfl: 1 .  JANUVIA 100 MG tablet, TAKE 1 TABLET BY MOUTH EVERY DAY, Disp: 90 tablet,  Rfl: 3 .  lovastatin (MEVACOR) 20 MG tablet, TAKE 1 TABLET BY MOUTH EVERY DAY, Disp: 90 tablet, Rfl: 3 .  magnesium oxide (MAG-OX) 400 MG tablet, Take 400 mg by mouth daily., Disp: , Rfl:  .  meloxicam (MOBIC) 7.5 MG tablet, Take 1-2 tablets (7.5-15 mg total) by mouth daily as needed for pain., Disp: 30 tablet, Rfl: 4 .  metFORMIN (GLUCOPHAGE-XR) 500 MG 24 hr tablet, TAKE 2 TABLETS BY MOUTH TWICE A DAY, Disp: 360 tablet, Rfl: 4 .  PARoxetine (PAXIL) 20 MG tablet, TAKE 1 TABLET BY MOUTH DAILY, Disp: 90 tablet, Rfl: 3  Review of Systems  Constitutional: Negative for appetite change, chills, fatigue and fever.  Respiratory: Negative for chest tightness and shortness of breath.   Cardiovascular: Negative for chest pain and palpitations.  Gastrointestinal: Negative for abdominal pain, nausea and vomiting.  Neurological: Negative for dizziness and weakness.    Social History   Tobacco Use  . Smoking status: Current Some Day Smoker    Packs/day: 0.25    Years: 42.00    Pack years: 10.50    Types: Cigarettes  . Smokeless tobacco: Never Used  . Tobacco comment: has a cigarette every few days  Substance Use Topics  . Alcohol use: Yes    Alcohol/week: 0.0 standard drinks    Comment: OCCASIONALLY      Objective:   BP 140/70 (BP Location: Left Arm, Patient Position: Sitting, Cuff Size: Normal)   Pulse 84   Temp 98.8 F (37.1 C) (Oral)   Resp 18   Wt 113 lb (51.3 kg)   SpO2 98% Comment: room air  BMI 22.07 kg/m  Vitals:   08/29/18 1554  BP: 140/70  Pulse: 84  Resp: 18  Temp: 98.8 F (37.1 C)  TempSrc: Oral  SpO2: 98%  Weight: 113 lb (51.3 kg)     Physical Exam   General Appearance:    Alert, cooperative, no distress  Eyes:    PERRL, conjunctiva/corneas clear, EOM's intact       Lungs:     Clear to auscultation bilaterally, respirations unlabored  Heart:    Regular rate and rhythm  Neurologic:   Awake, alert, oriented x 3. No apparent focal neurological            defect.   MS:   Tender at 14 fibromyalgia trigger points.    Results for orders placed or performed in visit on 08/29/18  POCT HgB A1C  Result Value Ref Range   Hemoglobin A1C 7.4 (A) 4.0 - 5.6 %   HbA1c POC (<> result, manual entry)     HbA1c, POC (prediabetic range)     HbA1c, POC (controlled diabetic range)     Est. average glucose Bld gHb Est-mCnc 166   POCT Urinalysis Dipstick  Result Value Ref Range   Color, UA yellow    Clarity, UA clear    Glucose, UA Positive (A) Negative   Bilirubin, UA negative    Ketones, UA Trace    Spec Grav, UA 1.025 1.010 - 1.025   Blood, UA Moderate (Non-hemolyzed)    pH, UA 6.0 5.0 - 8.0   Protein, UA Positive (A) Negative   Urobilinogen, UA 0.2 0.2 or  1.0 E.U./dL   Nitrite, UA negative    Leukocytes, UA Negative Negative   Appearance     Odor         Assessment & Plan    1. Diabetes mellitus without complication (HCC) Well controlled.  Continue current medications.   - POCT HgB A1C  2. Urinary urgency  - POCT Urinalysis Dipstick  3. Urinary tract infection without hematuria, site unspecified  - Urine Culture - ciprofloxacin (CIPRO) 500 MG tablet; Take 1 tablet (500 mg total) by mouth 2 (two) times daily for 3 days.  Dispense: 6 tablet; Refill: 0  4. Fibromyalgia try - gabapentin (NEURONTIN) 300 MG capsule; Take 1 capsule (300 mg total) by mouth at bedtime.  Dispense: 90 capsule; Refill: 3  5. Need for shingles vaccine  - Varicella-zoster vaccine IM     Mila Merryonald Fisher, MD  [facility]

## 2018-08-31 LAB — URINE CULTURE

## 2018-09-01 ENCOUNTER — Telehealth: Payer: Self-pay

## 2018-09-01 DIAGNOSIS — R109 Unspecified abdominal pain: Secondary | ICD-10-CM

## 2018-09-01 NOTE — Telephone Encounter (Signed)
-----   Message from Birdie Sons, MD sent at 08/31/2018  9:57 AM EDT ----- No sign of infection on urine culture. She probably has small kidney stone which would explain pain and blood in urine. Need renal ultrasound for flank pain and hematuria. Please advise and order [place].

## 2018-09-01 NOTE — Telephone Encounter (Signed)
Patient has been advised. KW 

## 2018-09-04 NOTE — Patient Instructions (Signed)
.   Please review the attached list of medications and notify my office if there are any errors.   . Please bring all of your medications to every appointment so we can make sure that our medication list is the same as yours.   

## 2018-09-05 ENCOUNTER — Other Ambulatory Visit: Payer: Self-pay

## 2018-09-05 ENCOUNTER — Ambulatory Visit
Admission: RE | Admit: 2018-09-05 | Discharge: 2018-09-05 | Disposition: A | Discharge status: Home / Self Care | Payer: BC Managed Care – PPO | Source: Ambulatory Visit | Attending: Family Medicine | Admitting: Family Medicine

## 2018-09-05 DIAGNOSIS — R109 Unspecified abdominal pain: Secondary | ICD-10-CM | POA: Insufficient documentation

## 2018-09-06 ENCOUNTER — Telehealth: Payer: Self-pay

## 2018-09-06 ENCOUNTER — Other Ambulatory Visit: Payer: Self-pay | Admitting: Family Medicine

## 2018-09-06 DIAGNOSIS — R109 Unspecified abdominal pain: Secondary | ICD-10-CM

## 2018-09-06 DIAGNOSIS — R319 Hematuria, unspecified: Secondary | ICD-10-CM

## 2018-09-06 DIAGNOSIS — N2889 Other specified disorders of kidney and ureter: Secondary | ICD-10-CM

## 2018-09-06 NOTE — Telephone Encounter (Signed)
LMTCB 09/06/2018   Thanks,   -Laura  

## 2018-09-06 NOTE — Progress Notes (Signed)
Mri abdomen

## 2018-09-06 NOTE — Telephone Encounter (Signed)
-----   Message from Birdie Sons, MD sent at 09/06/2018 10:24 AM EDT ----- Appears to be small masses or cysts in both kidneys. Can't tell exactly what they are on ultrasound. Need MRI for further evaluation. Have entered order.

## 2018-09-06 NOTE — Telephone Encounter (Signed)
Pt advised.   Thanks,   -Eun Vermeer  

## 2018-09-21 ENCOUNTER — Telehealth: Payer: Self-pay | Admitting: Family Medicine

## 2018-09-21 DIAGNOSIS — N281 Cyst of kidney, acquired: Secondary | ICD-10-CM

## 2018-09-21 NOTE — Telephone Encounter (Signed)
Patient had this done at [facility]. They will fax over report. Fisher pt. Left message to advise pt.

## 2018-09-21 NOTE — Telephone Encounter (Signed)
Pt had an MRI for her Kidneys.  She has not got the results back yet  CB#  [phone]  Thanks teri

## 2018-09-25 NOTE — Telephone Encounter (Signed)
Pt's husband is calling about results MRI kidneys that she had done last week.  Please Advise  [phone]  Thanks Con Memos

## 2018-09-25 NOTE — Telephone Encounter (Signed)
Patient is calling that she has not heard from [place] regarding her MRI results. Reports that this was done last week.

## 2018-09-26 ENCOUNTER — Telehealth: Payer: Self-pay

## 2018-09-26 NOTE — Telephone Encounter (Signed)
Pt advised.  Apt made for 09/28/2018  Thanks,   -Mickel Baas

## 2018-09-26 NOTE — Telephone Encounter (Signed)
Patient called about her MRI results and stated that [facility] faxed over the results. Patient states that she is getting a little frustrated because she is having to wait on her results. Please advise.

## 2018-09-26 NOTE — Telephone Encounter (Signed)
See previous phone note.   Thanks,   -Mickel Baas

## 2018-09-26 NOTE — Telephone Encounter (Signed)
Sorry, I never go a copy of the MRI report from [place], it must have gone to one of the other providers when I was out,  but found it in [place].   There were three small cysts and they were completely benign. I don't think these would explain blood in urine.   Need to repeat urinalysis with microscopy and send to [facility]. If blood has cleared then no further evaluation is needed, otherwise will need to refer to urology.

## 2018-09-27 ENCOUNTER — Telehealth: Payer: Self-pay | Admitting: *Deleted

## 2018-09-27 NOTE — Telephone Encounter (Addendum)
Patient has been notified that lung cancer screening CT scan is due currently or will be in near future. Confirmed that patient is within the appropriate age range, and asymptomatic, (no signs or symptoms of lung cancer). Patient denies illness that would prevent curative treatment for lung cancer if found. Verified smoking history ( former, states that she quit 1 1/2 yrs ago). Patient is agreeable for CT scan being scheduled. She would prefer that the appointment is as late as possible in the afternoon approx 4pm at least.

## 2018-09-28 ENCOUNTER — Other Ambulatory Visit: Payer: Self-pay

## 2018-09-28 ENCOUNTER — Ambulatory Visit: Payer: BC Managed Care – PPO | Admitting: Physician Assistant

## 2018-09-28 ENCOUNTER — Encounter: Payer: Self-pay | Admitting: Physician Assistant

## 2018-09-28 VITALS — BP 129/75 | HR 74 | Temp 98.1°F | Resp 16 | Wt 113.2 lb

## 2018-09-28 DIAGNOSIS — R319 Hematuria, unspecified: Secondary | ICD-10-CM

## 2018-09-28 DIAGNOSIS — N281 Cyst of kidney, acquired: Secondary | ICD-10-CM

## 2018-09-28 LAB — POCT URINALYSIS DIPSTICK
Bilirubin, UA: NEGATIVE
Glucose, UA: POSITIVE — AB
Ketones, UA: NEGATIVE
Leukocytes, UA: NEGATIVE
Nitrite, UA: NEGATIVE
Protein, UA: NEGATIVE
Spec Grav, UA: 1.015 (ref 1.010–1.025)
Urobilinogen, UA: 0.2 E.U./dL
pH, UA: 6.5 (ref 5.0–8.0)

## 2018-09-28 NOTE — Progress Notes (Signed)
Patient: Dana Weber Female    DOB: 23-Feb-1961   58 y.o.   MRN: [id number] Visit Date: 09/29/2018  Today's Provider: Trinna Post, PA-C   Chief Complaint  Patient presents with  . Follow-up   Subjective:     HPI Patient here today to repeat urinalysis with microscopy. On 6/9 she had dysuria and hematuria. Her urine culture was negative. Renal ultrasound on 09/06/2018 showed potential adrenal mass next to right kidney and a left kidney cyst. MRI abdomen at [facility] on 09/19/2018 showed 3 small benign left renal cysts and no solid renal mass. Patient is here today for repeat urinalysis to check for blood. She reports continued difficulty with voiding. She reports that she doesn't feel like she can completely empty and sometimes she will have urgency. If she will sit long enough, she does report that she may urinate a little. She reports when she had an ultrasound, she voided and then repeated the ultrasound and the technician reported she completely emptied her bladder. She had three children, two of which were vaginal births, weight 4-5 lbs each. She currently smokes.   Wt Readings from Last 3 Encounters:  09/28/18 113 lb 3.2 oz (51.3 kg)  08/29/18 113 lb (51.3 kg)  06/06/18 115 lb (52.2 kg)    Allergies  Allergen Reactions  . Amoxicillin Swelling    Facial, and itching  . Hydrocodone Bitartrate Er Swelling     Current Outpatient Medications:  .  albuterol (VENTOLIN HFA) 108 (90 Base) MCG/ACT inhaler, INHALE 2 PUFFS BY MOUTH EVERY 6 HOURS AS NEEDED FOR WHEEZING OR SHORTNESS OF BREATH, Disp: 8.5 g, Rfl: 5 .  benazepril-hydrochlorthiazide (LOTENSIN HCT) 10-12.5 MG tablet, TAKE 1 TABLET BY MOUTH DAILY., Disp: 90 tablet, Rfl: 3 .  cyclobenzaprine (FLEXERIL) 5 MG tablet, Take 1-2 tablets (5-10 mg total) by mouth 3 (three) times daily as needed (shoulder pain)., Disp: 30 tablet, Rfl: 1 .  FARXIGA 5 MG TABS tablet, TAKE 1 TABLET BY MOUTH EVERY DAY, Disp: 90 tablet,  Rfl: 1 .  gabapentin (NEURONTIN) 300 MG capsule, Take 1 capsule (300 mg total) by mouth at bedtime., Disp: 90 capsule, Rfl: 3 .  JANUVIA 100 MG tablet, TAKE 1 TABLET BY MOUTH EVERY DAY, Disp: 90 tablet, Rfl: 3 .  lovastatin (MEVACOR) 20 MG tablet, TAKE 1 TABLET BY MOUTH EVERY DAY, Disp: 90 tablet, Rfl: 3 .  magnesium oxide (MAG-OX) 400 MG tablet, Take 400 mg by mouth daily., Disp: , Rfl:  .  meloxicam (MOBIC) 7.5 MG tablet, Take 1-2 tablets (7.5-15 mg total) by mouth daily as needed for pain., Disp: 30 tablet, Rfl: 4 .  metFORMIN (GLUCOPHAGE-XR) 500 MG 24 hr tablet, TAKE 2 TABLETS BY MOUTH TWICE A DAY, Disp: 360 tablet, Rfl: 4 .  PARoxetine (PAXIL) 20 MG tablet, TAKE 1 TABLET BY MOUTH DAILY, Disp: 90 tablet, Rfl: 3  Review of Systems  Constitutional: Negative.   Genitourinary: Positive for difficulty urinating. Negative for hematuria.    Social History   Tobacco Use  . Smoking status: Current Some Day Smoker    Packs/day: 0.25    Years: 42.00    Pack years: 10.50    Types: Cigarettes  . Smokeless tobacco: Never Used  . Tobacco comment: has a cigarette every few days  Substance Use Topics  . Alcohol use: Yes    Alcohol/week: 0.0 standard drinks    Comment: OCCASIONALLY      Objective:   BP 129/75 (BP Location:  Left Arm, Patient Position: Sitting, Cuff Size: Normal)   Pulse 74   Temp 98.1 F (36.7 C) (Oral)   Resp 16   Wt 113 lb 3.2 oz (51.3 kg)   BMI 22.11 kg/m  Vitals:   09/28/18 1545  BP: 129/75  Pulse: 74  Resp: 16  Temp: 98.1 F (36.7 C)  TempSrc: Oral  Weight: 113 lb 3.2 oz (51.3 kg)     Physical Exam Constitutional:      Appearance: Normal appearance.  Cardiovascular:     Rate and Rhythm: Normal rate and regular rhythm.     Heart sounds: Normal heart sounds.  Pulmonary:     Effort: Pulmonary effort is normal.     Breath sounds: Normal breath sounds.  Abdominal:     General: Abdomen is flat. Bowel sounds are normal.     Palpations: Abdomen is soft.   Skin:    General: Skin is warm and dry.  Neurological:     Mental Status: She is alert and oriented to person, place, and time. Mental status is at baseline.      Results for orders placed or performed in visit on 09/28/18  POCT urinalysis dipstick  Result Value Ref Range   Color, UA yello    Clarity, UA clear    Glucose, UA Positive (A) Negative   Bilirubin, UA Negative    Ketones, UA Negative    Spec Grav, UA 1.015 1.010 - 1.025   Blood, UA Trace    pH, UA 6.5 5.0 - 8.0   Protein, UA Negative Negative   Urobilinogen, UA 0.2 0.2 or 1.0 E.U./dL   Nitrite, UA Negative    Leukocytes, UA Negative Negative   Appearance     Odor         Assessment & Plan    1. Hematuria, unspecified type  Send urine for microscopy. If positive for blood, refer to urology. May also consider possibility of prolapse for voiding difficulties.   - Urine Microscopic  2. Complex renal cyst  - Urine Microscopic  The entirety of the information documented in the History of Present Illness, Review of Systems and Physical Exam were personally obtained by me. Portions of this information were initially documented by Rondel BatonSulibeya Dimas, CMA and reviewed by me for thoroughness and accuracy.   F/u PRN  I have spent 15 minutes with this patient, >50% of which was spent on counseling and coordination of care.     Trey SailorsAdriana M Pollak, PA-C  [facility]

## 2018-09-28 NOTE — Patient Instructions (Signed)
Hematuria, Adult Hematuria is blood in the urine. Blood may be visible in the urine, or it may be identified with a test. This condition can be caused by infections of the bladder, urethra, kidney, or prostate. Other possible causes include:  Kidney stones.  Cancer of the urinary tract.  Too much calcium in the urine.  Conditions that are passed from parent to child (inherited conditions).  Exercise that requires a lot of energy. Infections can usually be treated with medicine, and a kidney stone usually will pass through your urine. If neither of these is the cause of your hematuria, more tests may be needed to identify the cause of your symptoms. It is very important to tell your health care provider about any blood in your urine, even if it is painless or the blood stops without treatment. Blood in the urine, when it happens and then stops and then happens again, can be a symptom of a very serious condition, including cancer. There is no pain in the initial stages of many urinary cancers. Follow these instructions at home: Medicines  Take over-the-counter and prescription medicines only as told by your health care provider.  If you were prescribed an antibiotic medicine, take it as told by your health care provider. Do not stop taking the antibiotic even if you start to feel better. Eating and drinking  Drink enough fluid to keep your urine clear or pale yellow. It is recommended that you drink 3-4 quarts (2.8-3.8 L) a day. If you have been diagnosed with an infection, it is recommended that you drink cranberry juice in addition to large amounts of water.  Avoid caffeine, tea, and carbonated beverages. These tend to irritate the bladder.  Avoid alcohol because it may irritate the prostate (men). General instructions  If you have been diagnosed with a kidney stone, follow your health care provider's instructions about straining your urine to catch the stone.  Empty your bladder  often. Avoid holding urine for long periods of time.  If you are female: ? After a bowel movement, wipe from front to back and use each piece of toilet paper only once. ? Empty your bladder before and after sex.  Pay attention to any changes in your symptoms. Tell your health care provider about any changes or any new symptoms.  It is your responsibility to get your test results. Ask your health care provider, or the department performing the test, when your results will be ready.  Keep all follow-up visits as told by your health care provider. This is important. Contact a health care provider if:  You develop back pain.  You have a fever.  You have nausea or vomiting.  Your symptoms do not improve after 3 days.  Your symptoms get worse. Get help right away if:  You develop severe vomiting and are unable take medicine without vomiting.  You develop severe pain in your back or abdomen even though you are taking medicine.  You pass a large amount of blood in your urine.  You pass blood clots in your urine.  You feel very weak or like you might faint.  You faint. Summary  Hematuria is blood in the urine. It has many possible causes.  It is very important that you tell your health care provider about any blood in your urine, even if it is painless or the blood stops without treatment.  Take over-the-counter and prescription medicines only as told by your health care provider.  Drink enough fluid to keep   your urine clear or pale yellow. This information is not intended to replace advice given to you by your health care provider. Make sure you discuss any questions you have with your health care provider. Document Released: 03/08/2005 Document Revised: 02/18/2017 Document Reviewed: 04/10/2016 Elsevier Patient Education  2020 Elsevier Inc.  

## 2018-09-29 ENCOUNTER — Telehealth: Payer: Self-pay

## 2018-09-29 ENCOUNTER — Other Ambulatory Visit: Payer: Self-pay | Admitting: *Deleted

## 2018-09-29 DIAGNOSIS — Z122 Encounter for screening for malignant neoplasm of respiratory organs: Secondary | ICD-10-CM

## 2018-09-29 DIAGNOSIS — Z87891 Personal history of nicotine dependence: Secondary | ICD-10-CM

## 2018-09-29 LAB — URINALYSIS, MICROSCOPIC ONLY
Casts: NONE SEEN /lpf
RBC, Urine: NONE SEEN /hpf (ref 0–2)

## 2018-09-29 NOTE — Telephone Encounter (Signed)
-----   Message from Trinna Post, [place] sent at 09/29/2018  9:44 AM EDT ----- There were no red blood cells seen on microscopic examination. I don't think she needs a referral to a urologist. She may consider a pelvic exam if her symptoms persist.

## 2018-09-29 NOTE — Telephone Encounter (Signed)
Patient advised as below.  

## 2018-10-04 ENCOUNTER — Ambulatory Visit
Admission: RE | Admit: 2018-10-04 | Discharge: 2018-10-04 | Disposition: A | Discharge status: Home / Self Care | Payer: BC Managed Care – PPO | Source: Ambulatory Visit | Attending: Oncology | Admitting: Oncology

## 2018-10-04 ENCOUNTER — Other Ambulatory Visit: Payer: Self-pay

## 2018-10-04 DIAGNOSIS — Z87891 Personal history of nicotine dependence: Secondary | ICD-10-CM | POA: Insufficient documentation

## 2018-10-04 DIAGNOSIS — Z122 Encounter for screening for malignant neoplasm of respiratory organs: Secondary | ICD-10-CM | POA: Insufficient documentation

## 2018-10-05 ENCOUNTER — Ambulatory Visit: Payer: BC Managed Care – PPO | Admitting: Physician Assistant

## 2018-10-05 ENCOUNTER — Other Ambulatory Visit (HOSPITAL_COMMUNITY)
Admission: RE | Admit: 2018-10-05 | Discharge: 2018-10-05 | Disposition: A | Discharge status: Home / Self Care | Payer: BC Managed Care – PPO | Source: Ambulatory Visit | Attending: Physician Assistant | Admitting: Physician Assistant

## 2018-10-05 ENCOUNTER — Encounter: Payer: Self-pay | Admitting: Physician Assistant

## 2018-10-05 VITALS — BP 122/64 | HR 78 | Temp 98.8°F | Resp 16 | Wt 111.0 lb

## 2018-10-05 DIAGNOSIS — N898 Other specified noninflammatory disorders of vagina: Secondary | ICD-10-CM | POA: Diagnosis not present

## 2018-10-05 DIAGNOSIS — Z124 Encounter for screening for malignant neoplasm of cervix: Secondary | ICD-10-CM

## 2018-10-05 DIAGNOSIS — R39198 Other difficulties with micturition: Secondary | ICD-10-CM | POA: Diagnosis not present

## 2018-10-05 NOTE — Patient Instructions (Signed)

## 2018-10-05 NOTE — Progress Notes (Signed)
Patient: Dana MimesMarcia Jolayne Velador Female    DOB: 06/24/1960   58 y.o.   MRN: [id number] Visit Date: 10/05/2018  Today's Provider: Trey SailorsAdriana M Joeline Freer, PA-C   Chief Complaint  Patient presents with  . Follow-up   Subjective:      HPI  Follow up from last office:  The patient was last seen for this 1 weeks ago. Patient was being followed for difficulty urinating and hematuria on dipstick but urine microscopy did not show any RBCs. She had an MRI that showed several benign renal cysts. She returns today for pelvic exam and a PAP smear. She continues to describe difficulty voiding completely. She has a history of two vaginal births. She denies bulging sensation. She does report pain with intercourse and some pelvic pain as well.   ------------------------------------------------------------------------------------  Allergies  Allergen Reactions  . Amoxicillin Swelling    Facial, and itching  . Hydrocodone Bitartrate Er Swelling     Current Outpatient Medications:  .  albuterol (VENTOLIN HFA) 108 (90 Base) MCG/ACT inhaler, INHALE 2 PUFFS BY MOUTH EVERY 6 HOURS AS NEEDED FOR WHEEZING OR SHORTNESS OF BREATH, Disp: 8.5 g, Rfl: 5 .  benazepril-hydrochlorthiazide (LOTENSIN HCT) 10-12.5 MG tablet, TAKE 1 TABLET BY MOUTH DAILY., Disp: 90 tablet, Rfl: 3 .  cyclobenzaprine (FLEXERIL) 5 MG tablet, Take 1-2 tablets (5-10 mg total) by mouth 3 (three) times daily as needed (shoulder pain)., Disp: 30 tablet, Rfl: 1 .  FARXIGA 5 MG TABS tablet, TAKE 1 TABLET BY MOUTH EVERY DAY, Disp: 90 tablet, Rfl: 1 .  gabapentin (NEURONTIN) 300 MG capsule, Take 1 capsule (300 mg total) by mouth at bedtime., Disp: 90 capsule, Rfl: 3 .  JANUVIA 100 MG tablet, TAKE 1 TABLET BY MOUTH EVERY DAY, Disp: 90 tablet, Rfl: 3 .  lovastatin (MEVACOR) 20 MG tablet, TAKE 1 TABLET BY MOUTH EVERY DAY, Disp: 90 tablet, Rfl: 3 .  magnesium oxide (MAG-OX) 400 MG tablet, Take 400 mg by mouth daily., Disp: , Rfl:  .  meloxicam  (MOBIC) 7.5 MG tablet, Take 1-2 tablets (7.5-15 mg total) by mouth daily as needed for pain., Disp: 30 tablet, Rfl: 4 .  metFORMIN (GLUCOPHAGE-XR) 500 MG 24 hr tablet, TAKE 2 TABLETS BY MOUTH TWICE A DAY, Disp: 360 tablet, Rfl: 4 .  PARoxetine (PAXIL) 20 MG tablet, TAKE 1 TABLET BY MOUTH DAILY, Disp: 90 tablet, Rfl: 3  Review of Systems  Social History   Tobacco Use  . Smoking status: Current Some Day Smoker    Packs/day: 0.25    Years: 42.00    Pack years: 10.50    Types: Cigarettes  . Smokeless tobacco: Never Used  . Tobacco comment: has a cigarette every few days  Substance Use Topics  . Alcohol use: Yes    Alcohol/week: 0.0 standard drinks    Comment: OCCASIONALLY      Objective:   BP 122/64 (BP Location: Left Arm, Patient Position: Sitting, Cuff Size: Normal)   Pulse 78   Temp 98.8 F (37.1 C) (Oral)   Resp 16   Wt 111 lb (50.3 kg)   SpO2 98% Comment: room air  BMI 21.68 kg/m  Vitals:   10/05/18 1550  BP: 122/64  Pulse: 78  Resp: 16  Temp: 98.8 F (37.1 C)  TempSrc: Oral  SpO2: 98%  Weight: 111 lb (50.3 kg)     Physical Exam Exam conducted with a chaperone present.  Constitutional:      Appearance: Normal appearance.  Genitourinary:  General: Normal vulva.     Cervix: Normal.  Skin:    General: Skin is warm and dry.  Neurological:     Mental Status: She is alert.  Psychiatric:        Mood and Affect: Mood normal.        Behavior: Behavior normal.      No results found for any visits on 10/05/18.     Assessment & Plan    1. Cervical cancer screening  PAP completed today. Do not appreciate prolapse. We will have her see gynecology for further workup and examination regarding her urinary symptoms and also painful intercourse.  - Cytology - PAP  2. Difficulty voiding  - Ambulatory referral to Gynecology  3. Vaginal dryness  - Ambulatory referral to Gynecology  The entirety of the information documented in the History of Present  Illness, Review of Systems and Physical Exam were personally obtained by me. Portions of this information were initially documented by Meyer Cory, CMA and reviewed by me for thoroughness and accuracy.   F/u PRN     Trinna Post, PA-C  [facility]

## 2018-10-10 ENCOUNTER — Encounter: Payer: Self-pay | Admitting: *Deleted

## 2018-10-10 LAB — CYTOLOGY - PAP
Diagnosis: NEGATIVE
HPV 16/18/45 genotyping: NEGATIVE
HPV: DETECTED — AB

## 2018-10-11 ENCOUNTER — Telehealth: Payer: Self-pay

## 2018-10-11 NOTE — Telephone Encounter (Signed)
Patient advised as directed below. 

## 2018-10-11 NOTE — Telephone Encounter (Signed)
LMTCB

## 2018-10-11 NOTE — Telephone Encounter (Signed)
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 We can repeat testing in one year or she has also been referred to gynecology, who can touch further on management of this.

## 2018-10-23 ENCOUNTER — Ambulatory Visit (INDEPENDENT_AMBULATORY_CARE_PROVIDER_SITE_OTHER): Payer: BC Managed Care – PPO | Admitting: Obstetrics and Gynecology

## 2018-10-23 ENCOUNTER — Encounter: Payer: Self-pay | Admitting: Obstetrics and Gynecology

## 2018-10-23 ENCOUNTER — Other Ambulatory Visit: Payer: Self-pay

## 2018-10-23 VITALS — BP 120/70 | HR 89 | Ht 60.0 in | Wt 113.0 lb

## 2018-10-23 DIAGNOSIS — N816 Rectocele: Secondary | ICD-10-CM

## 2018-10-23 DIAGNOSIS — N8110 Cystocele, unspecified: Secondary | ICD-10-CM | POA: Diagnosis not present

## 2018-10-23 DIAGNOSIS — R300 Dysuria: Secondary | ICD-10-CM

## 2018-10-23 DIAGNOSIS — R39198 Other difficulties with micturition: Secondary | ICD-10-CM | POA: Diagnosis not present

## 2018-10-23 NOTE — Progress Notes (Signed)
Patient ID: Dana Weber, female   DOB: 02/13/1961, 58 y.o.   MRN: [id number]  Reason for Consult: Referral (Uncomfortable to go pee feels like she has to go but has a hard time going, painful intercourse and trouble with dryness )   Referred by Trey SailorsPollak, Adriana M, PA-C  Subjective:     HPI:  Dana Weber is a 58 y.o. female. Her main complaint and concern today is a recent change she has had with difficulty emptying her bladder. She reports that she has an urge to void but that she is not able to pee when she sits on the toilet. Sometimes it will take 5-10 minutes before she can pee. She will push on her stomach or pour water on herself to try and get the urge to void. She says that this is worse at night than in the morning. It is especially bad before she goes to sleep. If she can not urinate before bed she usually wakes up once at night to pee. She reports that sometimes she urinates a small amount and sometimes she urinates a large amount. She does not have stress or urge incontinence. She does sometimes have a perception of being kicked in the bladder as if she was pregnant.  She also observes a sensation of having gras stuck between her toes, but then nothing is there. She does not have muscles weakness or changes in sensation.   She has had an abdominal  MRI and US of her kidneys. Which showed a renal cyst: Left kidney upper pole 1.2 cm Bosniak 2 cyst [facility], but she feels like they did not address her urinary complaints.   No history of procedures to her bladder or urethra She is a diabetic.  No hx of nerve injury.  No hx of MS.  No hx of recurrent UTI.  No symptoms of a vaginal bulge or pressure.  +hx of external and internal hemorrhoids.   Reports loose stools from taking metformin. She will have a BM every 2-3 days. Last colonoscopy 4 years ago.   She had a supracervical hysterectomy in 2008 for heavy bleeding and fibroids. She  still has her cervix and right ovary.  No history of abnormal pap smears.   She has a history of painful intercourse since her 1420s. She feels a deep stabbing pain. This is not helped by various positions. She denies vaginal dryness. This is not as concerning to her has her recent bladder issues  Gynecological History Last pap smear: 2020, NIL HPV+  (40,98,11(16,18,45 negative) Last Mammogram: 2019 birads 1 Sexually Active: yes  Obstetrical History  Past Medical History:  Diagnosis Date  . Diabetes mellitus without complication (HCC)   . Fatty liver   . Headache    sinus  . Hyperlipidemia   . Hypertension   . Multilevel degenerative disc disease    "spurs" on spine also  . Vertigo    mild - daily - positional  . Wears dentures    partial lower   Family History  Problem Relation Age of Onset  . Diabetes Mother   . Hypertension Mother   . Diabetes Father   . Hypertension Father   . Hypertension Brother   . Diabetes Paternal Grandmother   . Emphysema Paternal Grandfather   . Hypertension Brother    Past Surgical History:  Procedure Laterality Date  . ABDOMINAL HYSTERECTOMY  2008  . CESAREAN SECTION    . CHOLECYSTECTOMY    .  COLONOSCOPY WITH PROPOFOL N/A 03/20/2015   Procedure: COLONOSCOPY WITH PROPOFOL;  Surgeon: Lucilla Lame, MD;  Location: [facility];  Service: Endoscopy;  Laterality: N/A;  . DILATION AND CURETTAGE OF UTERUS    . ESOPHAGOGASTRODUODENOSCOPY (EGD) WITH PROPOFOL N/A 03/20/2015   Procedure: ESOPHAGOGASTRODUODENOSCOPY (EGD) WITH PROPOFOL;  Surgeon: Lucilla Lame, MD;  Location: [facility];  Service: Endoscopy;  Laterality: N/A;  Diabetic - oral meds  . HEMORRHOID SURGERY    . TONSILLECTOMY AND ADENOIDECTOMY  1960    Short Social History:  Social History   Tobacco Use  . Smoking status: Current Some Day Smoker    Packs/day: 0.25    Years: 42.00    Pack years: 10.50    Types: Cigarettes  . Smokeless tobacco: Never Used  . Tobacco comment:  has a cigarette every few days  Substance Use Topics  . Alcohol use: Yes    Alcohol/week: 0.0 standard drinks    Comment: OCCASIONALLY    Allergies  Allergen Reactions  . Hydrocodone Swelling  . Penicillins Swelling  . Amoxicillin Swelling    Facial, and itching  . Hydrocodone Bitartrate Er Swelling    Current Outpatient Medications  Medication Sig Dispense Refill  . albuterol (VENTOLIN HFA) 108 (90 Base) MCG/ACT inhaler INHALE 2 PUFFS BY MOUTH EVERY 6 HOURS AS NEEDED FOR WHEEZING OR SHORTNESS OF BREATH 8.5 g 5  . benazepril-hydrochlorthiazide (LOTENSIN HCT) 10-12.5 MG tablet TAKE 1 TABLET BY MOUTH DAILY. 90 tablet 3  . cyclobenzaprine (FLEXERIL) 5 MG tablet Take 1-2 tablets (5-10 mg total) by mouth 3 (three) times daily as needed (shoulder pain). 30 tablet 1  . FARXIGA 5 MG TABS tablet TAKE 1 TABLET BY MOUTH EVERY DAY 90 tablet 1  . gabapentin (NEURONTIN) 300 MG capsule Take 1 capsule (300 mg total) by mouth at bedtime. 90 capsule 3  . JANUVIA 100 MG tablet TAKE 1 TABLET BY MOUTH EVERY DAY 90 tablet 3  . lovastatin (MEVACOR) 20 MG tablet TAKE 1 TABLET BY MOUTH EVERY DAY 90 tablet 3  . magnesium oxide (MAG-OX) 400 MG tablet Take 400 mg by mouth daily.    . meloxicam (MOBIC) 7.5 MG tablet Take 1-2 tablets (7.5-15 mg total) by mouth daily as needed for pain. 30 tablet 4  . metFORMIN (GLUCOPHAGE-XR) 500 MG 24 hr tablet TAKE 2 TABLETS BY MOUTH TWICE A DAY 360 tablet 4  . PARoxetine (PAXIL) 20 MG tablet TAKE 1 TABLET BY MOUTH DAILY 90 tablet 3  . varenicline (CHANTIX) 1 MG tablet Chantix Continuing Month Box 1 mg tablet     No current facility-administered medications for this visit.     Review of Systems  Constitutional: Positive for fatigue. Negative for chills, fever and unexpected weight change.  HENT: Negative for trouble swallowing.  Eyes: Negative for loss of vision.  Respiratory: Positive for cough. Negative for shortness of breath and wheezing.  Cardiovascular: Negative for  chest pain, leg swelling, palpitations and syncope.  GI: Positive for diarrhea. Negative for abdominal pain, blood in stool, nausea and vomiting.  GU: Positive for difficulty urinating and dysuria. Negative for frequency and hematuria.  Musculoskeletal: Positive for joint pain. Negative for back pain and leg pain.  Skin: Negative for rash.  Neurological: Positive for headaches and numbness. Negative for dizziness, light-headedness and seizures.  Psychiatric: Negative for behavioral problem, confusion, depressed mood and sleep disturbance.        Objective:  Objective   Vitals:   10/23/18 1535  BP: 120/70  Pulse: 89  Weight: 113 lb (51.3 kg)  Height: 5' (1.524 m)   Body mass index is 22.07 kg/m.  Physical Exam Vitals signs and nursing note reviewed.  Constitutional:      Appearance: She is well-developed.  HENT:     Head: Normocephalic and atraumatic.  Eyes:     Pupils: Pupils are equal, round, and reactive to light.  Cardiovascular:     Rate and Rhythm: Normal rate and regular rhythm.  Pulmonary:     Effort: Pulmonary effort is normal. No respiratory distress.  Skin:    General: Skin is warm and dry.  Neurological:     Mental Status: She is alert and oriented to person, place, and time.  Psychiatric:        Behavior: Behavior normal.        Thought Content: Thought content normal.        Judgment: Judgment normal.    POP-Q exam:      Aa = -3 Ba = -1 C = 5  gH = 3 pb = 3 TVL = 7  Ap = -3 BP = +1 D = 7   Summary statement of POP-Q exam:    the anterior vaginal wall is 1 cm behind the level of the hymen the cuff / cervix is 5 cm behind the level of the hymen,  and the posterior vaginal wall is 1cm in front of the level of the hymen   Renal US report 09/05/2018  IMPRESSION: 1. 4.0 x 2.9 x 2.1 cm solid appearing mass is noted adjacent to the right kidney, this is possibly an adrenal mass. This mass was not present on prior CT of 09/15/2017. MRI of the abdomen  should be considered for further evaluation.  2. 1.0 x 1.2 x 1.3 cm complex septated mass in the left kidney. This can also be further evaluated with MRI. 0.6 x 0.9 x 0.8 cm simple cyst left kidney.  3. Renal echotexture normal. No hydronephrosis. No bladder Distention.  MRI ABD w &wo contrast 09/19/2018 IMPRESSION: 1. 3 small benign left renal cysts. No solid renal mass      Assessment/Plan:     58 yo with new onset symptoms of urinary retention.  Will refer to urogynecology for urodynamic testing, cystoscopy, etc., and further care.  Patient declines PVR and urine culture today.  Given voiding diary, toilet hat, and instructions to complete the diary.  Stage 2 cystocele and rectocele. No management at this time.   More than 60  minutes were spent face to face with the patient in the room with more than 50% of the time spent providing counseling and discussing the plan of management.   Follow up as needed    Adelene Idlerhristanna Schuman MD [facility], [facility] 10/23/2018 4:32 PM

## 2018-10-28 ENCOUNTER — Other Ambulatory Visit: Payer: Self-pay | Admitting: Family Medicine

## 2018-10-28 DIAGNOSIS — E1100 Type 2 diabetes mellitus with hyperosmolarity without nonketotic hyperglycemic-hyperosmolar coma (NKHHC): Secondary | ICD-10-CM

## 2018-10-28 DIAGNOSIS — I10 Essential (primary) hypertension: Secondary | ICD-10-CM

## 2018-12-10 ENCOUNTER — Other Ambulatory Visit: Payer: Self-pay | Admitting: Family Medicine

## 2018-12-10 DIAGNOSIS — J069 Acute upper respiratory infection, unspecified: Secondary | ICD-10-CM

## 2018-12-13 ENCOUNTER — Encounter: Payer: Self-pay | Admitting: Family Medicine

## 2019-01-02 ENCOUNTER — Ambulatory Visit: Payer: Self-pay | Admitting: Family Medicine

## 2019-01-02 NOTE — Progress Notes (Deleted)
Patient: Dana Weber Female    DOB: 03/27/1960   58 y.o.   MRN: [id number] Visit Date: 01/02/2019  Today's Provider: Mila Merry, MD   No chief complaint on file.  Subjective:     HPI  Diabetes Mellitus Type II, Follow-up:   Lab Results  Component Value Date   HGBA1C 7.4 (A) 08/29/2018   HGBA1C 7.7 (A) 03/13/2018   HGBA1C 7.5 04/30/2016    Last seen for diabetes 4 months ago.  Management since then includes none. She reports {excellent/good/fair/poor:19665} compliance with treatment. She {ACTION; IS/IS EHU:[id number]} having side effects. *** Current symptoms include {Symptoms; diabetes:14075} and have been {Desc; course:15616}. Home blood sugar records: {diabetes glucometry results:16657}  Episodes of hypoglycemia? {yes***/no:17258}   Current insulin regiment: Is not on insulin Most Recent Eye Exam: 02/06/2018 Weight trend: {trend:16658} Prior visit with dietician: No {Current exercise:16438:::1} {Current diet habits:16563:::1}  Pertinent Labs:    Component Value Date/Time   CHOL 125 03/17/2018 0805   TRIG 91 03/17/2018 0805   HDL 41 03/17/2018 0805   LDLCALC 66 03/17/2018 0805   CREATININE 0.64 06/07/2018 1553    Wt Readings from Last 3 Encounters:  10/23/18 113 lb (51.3 kg)  10/05/18 111 lb (50.3 kg)  10/04/18 110 lb (49.9 kg)    ------------------------------------------------------------------------  Allergies  Allergen Reactions  . Hydrocodone Swelling  . Penicillins Swelling  . Amoxicillin Swelling    Facial, and itching  . Hydrocodone Bitartrate Er Swelling     Current Outpatient Medications:  .  albuterol (VENTOLIN HFA) 108 (90 Base) MCG/ACT inhaler, INHALE 2 PUFFS BY MOUTH EVERY 6 HOURS AS NEEDED FOR WHEEZING OR SHORTNESS OF BREATH, Disp: 8.5 g, Rfl: 5 .  benazepril-hydrochlorthiazide (LOTENSIN HCT) 10-12.5 MG tablet, TAKE 1 TABLET BY MOUTH DAILY., Disp: 90 tablet, Rfl: 3 .  cyclobenzaprine (FLEXERIL) 5 MG tablet, Take 1-2  tablets (5-10 mg total) by mouth 3 (three) times daily as needed (shoulder pain)., Disp: 30 tablet, Rfl: 1 .  FARXIGA 5 MG TABS tablet, TAKE 1 TABLET BY MOUTH EVERY DAY, Disp: 90 tablet, Rfl: 3 .  gabapentin (NEURONTIN) 300 MG capsule, Take 1 capsule (300 mg total) by mouth at bedtime., Disp: 90 capsule, Rfl: 3 .  JANUVIA 100 MG tablet, TAKE 1 TABLET BY MOUTH EVERY DAY, Disp: 90 tablet, Rfl: 3 .  lovastatin (MEVACOR) 20 MG tablet, TAKE 1 TABLET BY MOUTH EVERY DAY, Disp: 90 tablet, Rfl: 3 .  magnesium oxide (MAG-OX) 400 MG tablet, Take 400 mg by mouth daily., Disp: , Rfl:  .  meloxicam (MOBIC) 7.5 MG tablet, Take 1-2 tablets (7.5-15 mg total) by mouth daily as needed for pain., Disp: 30 tablet, Rfl: 4 .  metFORMIN (GLUCOPHAGE-XR) 500 MG 24 hr tablet, TAKE 2 TABLETS BY MOUTH TWICE A DAY, Disp: 360 tablet, Rfl: 4 .  PARoxetine (PAXIL) 20 MG tablet, TAKE 1 TABLET BY MOUTH DAILY, Disp: 90 tablet, Rfl: 3 .  varenicline (CHANTIX) 1 MG tablet, Chantix Continuing Month Box 1 mg tablet, Disp: , Rfl:   Review of Systems  Social History   Tobacco Use  . Smoking status: Current Some Day Smoker    Packs/day: 0.25    Years: 42.00    Pack years: 10.50    Types: Cigarettes  . Smokeless tobacco: Never Used  . Tobacco comment: has a cigarette every few days  Substance Use Topics  . Alcohol use: Yes    Alcohol/week: 0.0 standard drinks    Comment: OCCASIONALLY  Objective:   There were no vitals taken for this visit. There were no vitals filed for this visit.There is no height or weight on file to calculate BMI.   Physical Exam   No results found for any visits on 01/02/19.     Assessment & Plan        Lelon Huh, MD  [facility]

## 2019-01-08 ENCOUNTER — Ambulatory Visit: Payer: BC Managed Care – PPO | Admitting: Family Medicine

## 2019-01-08 ENCOUNTER — Encounter: Payer: Self-pay | Admitting: Family Medicine

## 2019-01-08 ENCOUNTER — Other Ambulatory Visit: Payer: Self-pay

## 2019-01-08 VITALS — BP 110/62 | HR 95 | Temp 97.1°F | Resp 16 | Wt 113.0 lb

## 2019-01-08 DIAGNOSIS — F410 Panic disorder [episodic paroxysmal anxiety] without agoraphobia: Secondary | ICD-10-CM | POA: Diagnosis not present

## 2019-01-08 DIAGNOSIS — G8929 Other chronic pain: Secondary | ICD-10-CM

## 2019-01-08 DIAGNOSIS — M542 Cervicalgia: Secondary | ICD-10-CM

## 2019-01-08 DIAGNOSIS — F3289 Other specified depressive episodes: Secondary | ICD-10-CM

## 2019-01-08 DIAGNOSIS — E119 Type 2 diabetes mellitus without complications: Secondary | ICD-10-CM

## 2019-01-08 DIAGNOSIS — I10 Essential (primary) hypertension: Secondary | ICD-10-CM | POA: Diagnosis not present

## 2019-01-08 LAB — POCT GLYCOSYLATED HEMOGLOBIN (HGB A1C)
Est. average glucose Bld gHb Est-mCnc: 171
Hemoglobin A1C: 7.6 % — AB (ref 4.0–5.6)

## 2019-01-08 MED ORDER — CYCLOBENZAPRINE HCL 5 MG PO TABS: 5.0000 mg | ORAL_TABLET | Freq: Three times a day (TID) | ORAL | 1 refills | Status: DC | PRN

## 2019-01-08 MED ORDER — SITAGLIPTIN PHOSPHATE 100 MG PO TABS: 50.0000 mg | ORAL_TABLET | Freq: Every day | ORAL | Status: DC

## 2019-01-08 MED ORDER — PAROXETINE HCL 20 MG PO TABS: 40.0000 mg | ORAL_TABLET | Freq: Every day | ORAL | Status: DC

## 2019-01-08 MED ORDER — BENAZEPRIL HCL 5 MG PO TABS: 5.0000 mg | ORAL_TABLET | Freq: Every day | ORAL | 3 refills | Status: DC

## 2019-01-08 MED ORDER — FARXIGA 10 MG PO TABS: 10.0000 mg | ORAL_TABLET | Freq: Every day | ORAL | 3 refills | Status: DC

## 2019-01-08 NOTE — Patient Instructions (Signed)
.   Please review the attached list of medications and notify my office if there are any errors.   . Please bring all of your medications to every appointment so we can make sure that our medication list is the same as yours.   . It is especially important to get the annual flu vaccine this year. If you haven't had it already, please go to your pharmacy or call the office as soon as possible to schedule you flu shot.  

## 2019-01-08 NOTE — Progress Notes (Signed)
Patient: Dana Weber Female    DOB: Mar 22, 1961   58 y.o.   MRN: [id number] Visit Date: 01/08/2019  Today's Provider: Lelon Huh, MD   Chief Complaint  Patient presents with  . Diabetes   Subjective:     HPI  Diabetes Mellitus Type II, Follow-up:   Lab Results  Component Value Date   HGBA1C 7.6 (A) 01/08/2019   HGBA1C 7.4 (A) 08/29/2018   HGBA1C 7.7 (A) 03/13/2018    Last seen for diabetes 4 months ago.  Management since then includes none. She reports good compliance with treatment. She is not having side effects.  Current symptoms include none and have been stable. Home blood sugar records: blood sugars are not checked often  Episodes of hypoglycemia? no   Current insulin regiment: Is not on insulin Most Recent Eye Exam: UTD Weight trend: stable Prior visit with dietician: No Current exercise: walking Current diet habits: well balanced  Pertinent Labs:    Component Value Date/Time   CHOL 125 03/17/2018 0805   TRIG 91 03/17/2018 0805   HDL 41 03/17/2018 0805   LDLCALC 66 03/17/2018 0805   CREATININE 0.64 06/07/2018 1553    Wt Readings from Last 3 Encounters:  01/08/19 113 lb (51.3 kg)  10/23/18 113 lb (51.3 kg)  10/05/18 111 lb (50.3 kg)    ------------------------------------------------------------------------  Follow up for Fibromyalgia:  The patient was last seen for this 4 months ago. Changes made at last visit include trying Gabapentin.  She reports good compliance with treatment. She feels that condition is Unchanged. Patient states she doesn't see any improvement in symptoms since starting Gabapentin.  She is not having side effects.   ------------------------------------------------------------------------------------  Allergies  Allergen Reactions  . Hydrocodone Swelling  . Penicillins Swelling  . Amoxicillin Swelling    Facial, and itching  . Hydrocodone Bitartrate Er Swelling     Current Outpatient  Medications:  .  albuterol (VENTOLIN HFA) 108 (90 Base) MCG/ACT inhaler, INHALE 2 PUFFS BY MOUTH EVERY 6 HOURS AS NEEDED FOR WHEEZING OR SHORTNESS OF BREATH, Disp: 8.5 g, Rfl: 5 .  benazepril-hydrochlorthiazide (LOTENSIN HCT) 10-12.5 MG tablet, TAKE 1/2 TABLET BY MOUTH DAILY., Disp: 90 tablet, Rfl: 3 .  FARXIGA 5 MG TABS tablet, TAKE 1 TABLET BY MOUTH EVERY DAY, Disp: 90 tablet, Rfl: 3 .  gabapentin (NEURONTIN) 300 MG capsule, Take 1 capsule (300 mg total) by mouth at bedtime., Disp: 90 capsule, Rfl: 3 .  JANUVIA 100 MG tablet, TAKE 1 TABLET BY MOUTH EVERY DAY, Disp: 90 tablet, Rfl: 3 .  lovastatin (MEVACOR) 20 MG tablet, TAKE 1 TABLET BY MOUTH EVERY DAY, Disp: 90 tablet, Rfl: 3 .  magnesium oxide (MAG-OX) 400 MG tablet, Take 400 mg by mouth daily., Disp: , Rfl:  .  metFORMIN (GLUCOPHAGE-XR) 500 MG 24 hr tablet, TAKE 2 TABLETS BY MOUTH TWICE A DAY, Disp: 360 tablet, Rfl: 4 .  PARoxetine (PAXIL) 20 MG tablet, TAKE 1 TABLET BY MOUTH DAILY, Disp: 90 tablet, Rfl: 3 .  cyclobenzaprine (FLEXERIL) 5 MG tablet, Take 1-2 tablets (5-10 mg total) by mouth 3 (three) times daily as needed (shoulder pain). (Patient not taking: Reported on 01/08/2019), Disp: 30 tablet, Rfl: 1 .  meloxicam (MOBIC) 7.5 MG tablet, Take 1-2 tablets (7.5-15 mg total) by mouth daily as needed for pain. (Patient not taking: Reported on 01/08/2019), Disp: 30 tablet, Rfl: 4  Review of Systems  Constitutional: Negative for appetite change, chills, fatigue and fever.  Respiratory: Negative  for chest tightness and shortness of breath.   Cardiovascular: Negative for chest pain and palpitations.  Gastrointestinal: Positive for diarrhea. Negative for abdominal pain, nausea and vomiting.  Neurological: Positive for tremors (shakiness of hands), numbness (tingling in hands and feet) and headaches. Negative for dizziness and weakness.    Social History   Tobacco Use  . Smoking status: Current Some Day Smoker    Packs/day: 0.25    Years:  42.00    Pack years: 10.50    Types: Cigarettes  . Smokeless tobacco: Never Used  . Tobacco comment: has a cigarette every few days  Substance Use Topics  . Alcohol use: Yes    Alcohol/week: 0.0 standard drinks    Comment: OCCASIONALLY      Objective:   BP 110/62 (BP Location: Left Arm, Patient Position: Sitting, Cuff Size: Normal)   Pulse 95   Temp (!) 97.1 F (36.2 C) (Temporal)   Resp 16   Wt 113 lb (51.3 kg)   SpO2 98% Comment: room air  BMI 22.07 kg/m  Vitals:   01/08/19 1509  BP: 110/62  Pulse: 95  Resp: 16  Temp: (!) 97.1 F (36.2 C)  TempSrc: Temporal  SpO2: 98%  Weight: 113 lb (51.3 kg)  Body mass index is 22.07 kg/m.   Physical Exam  General appearance: Well developed, well nourished female, cooperative and in no acute distress Head: Normocephalic, without obvious abnormality, atraumatic Respiratory: Respirations even and unlabored, normal respiratory rate Extremities: All extremities are intact.  Skin: Skin color, texture, turgor normal. No rashes seen  Psych: Appropriate mood and affect. Neurologic: Mental status: Alert, oriented to person, place, and time, thought content appropriate.  Results for orders placed or performed in visit on 01/08/19  POCT HgB A1C  Result Value Ref Range   Hemoglobin A1C 7.6 (A) 4.0 - 5.6 %   HbA1c POC (<> result, manual entry)     Est. average glucose Bld gHb Est-mCnc 171        Assessment & Plan     1. Diabetes mellitus without complication (HCC) a1c not to goal. She states Januvia is rather expensive. Will increase - dapagliflozin propanediol (FARXIGA) 10 MG TABS tablet; Take 10 mg by mouth daily before breakfast.  Dispense: 90 tablet; Refill: 3 And start weaning- sitaGLIPtin (JANUVIA) 100 MG tablet; to 0.5 tablets (50 mg total) by mouth daily.  Dispense: 3 tablet  2. Chronic neck pain refill - cyclobenzaprine (FLEXERIL) 5 MG tablet; Take 1-2 tablets (5-10 mg total) by mouth 3 (three) times daily as needed  (shoulder pain).  Dispense: 30 tablet; Refill: 1  3. Essential (primary) hypertension Very well controlled, expect BP to go down more with increase [place]. Will change Lotensin-hctz to - benazepril (LOTENSIN) 5 MG tablet; Take 1 tablet (5 mg total) by mouth daily.  Dispense: 90 tablet; Refill: 3  4. Episodic paroxysmal anxiety disorder Well controlled on paroxetine.   5. Other depression Not controlled, double- PARoxetine (PAXIL) 20 MG tablet; to 2 tablets (40 mg total) by mouth daily.  Dispense: 3 tablet  Follow up 4 months   The entirety of the information documented in the History of Present Illness, Review of Systems and Physical Exam were personally obtained by me. Portions of this information were initially documented by Awilda Bill, CMA and reviewed by me for thoroughness and accuracy.   States she had flu shot at work    Mila Merry, MD  [facility]

## 2019-01-26 ENCOUNTER — Other Ambulatory Visit: Payer: Self-pay | Admitting: Family Medicine

## 2019-01-26 DIAGNOSIS — E119 Type 2 diabetes mellitus without complications: Secondary | ICD-10-CM

## 2019-02-12 LAB — HM DIABETES EYE EXAM

## 2019-05-14 ENCOUNTER — Ambulatory Visit (INDEPENDENT_AMBULATORY_CARE_PROVIDER_SITE_OTHER): Payer: BC Managed Care – PPO | Admitting: Family Medicine

## 2019-05-14 ENCOUNTER — Other Ambulatory Visit: Payer: Self-pay

## 2019-05-14 ENCOUNTER — Encounter: Payer: Self-pay | Admitting: Family Medicine

## 2019-05-14 VITALS — BP 138/72 | HR 83 | Temp 97.5°F | Resp 16 | Wt 112.0 lb

## 2019-05-14 DIAGNOSIS — E785 Hyperlipidemia, unspecified: Secondary | ICD-10-CM

## 2019-05-14 DIAGNOSIS — I10 Essential (primary) hypertension: Secondary | ICD-10-CM | POA: Diagnosis not present

## 2019-05-14 DIAGNOSIS — E119 Type 2 diabetes mellitus without complications: Secondary | ICD-10-CM

## 2019-05-14 DIAGNOSIS — E559 Vitamin D deficiency, unspecified: Secondary | ICD-10-CM | POA: Diagnosis not present

## 2019-05-14 LAB — POCT GLYCOSYLATED HEMOGLOBIN (HGB A1C)
Est. average glucose Bld gHb Est-mCnc: 192
Hemoglobin A1C: 8.3 % — AB (ref 4.0–5.6)

## 2019-05-14 MED ORDER — CLINDAMYCIN HCL 300 MG PO CAPS: 300.0000 mg | ORAL_CAPSULE | Freq: Three times a day (TID) | ORAL | 0 refills | Status: AC

## 2019-05-14 NOTE — Progress Notes (Signed)
Patient: Dana Weber Female    DOB: 1960/09/28   59 y.o.   MRN: [id number] Visit Date: 05/14/2019  Today's Provider: Mila Merry, MD   Chief Complaint  Patient presents with  . Diabetes  . Hypertension  . Depression   Subjective:     HPI  Diabetes Mellitus Type II, Follow-up:   Lab Results  Component Value Date   HGBA1C 8.3 (A) 05/14/2019   HGBA1C 7.6 (A) 01/08/2019   HGBA1C 7.4 (A) 08/29/2018    Last seen for diabetes 4 months ago.  Management since then includes increasing Farxiga to 10mg  daily, and started weaning Januvia due to cost. She reports good compliance with treatment. She is not having side effects.  Current symptoms include none  Home blood sugar records: blood sugars are not checked  Episodes of hypoglycemia? no   Current insulin regiment: Is not on insulin Most Recent Eye Exam: 02/12/2019 Weight trend: stable Prior visit with dietician: NO Current exercise: none Current diet habits: in general, a "healthy" diet    Pertinent Labs:    Component Value Date/Time   CHOL 125 03/17/2018 0805   TRIG 91 03/17/2018 0805   HDL 41 03/17/2018 0805   LDLCALC 66 03/17/2018 0805   CREATININE 0.64 06/07/2018 1553    Wt Readings from Last 3 Encounters:  05/14/19 112 lb (50.8 kg)  01/08/19 113 lb (51.3 kg)  10/23/18 113 lb (51.3 kg)    ------------------------------------------------------------------------  Hypertension, follow-up:  BP Readings from Last 3 Encounters:  05/14/19 138/72  01/08/19 110/62  10/23/18 120/70    She was last seen for hypertension 4 months ago.  BP at that visit was 110/62. Management since that visit includes changing Lotensin/ HCTZ to Benazepril 5mg  daily. She reports good compliance with treatment. She is not having side effects.  She is not exercising. She is adherent to low salt diet.   Outside blood pressures are not checked. She is experiencing none.  Patient denies chest pain, chest  pressure/discomfort, irregular heart beat and palpitations.   Cardiovascular risk factors include diabetes mellitus and hypertension.  Use of agents associated with hypertension: none.     Weight trend: stable Wt Readings from Last 3 Encounters:  05/14/19 112 lb (50.8 kg)  01/08/19 113 lb (51.3 kg)  10/23/18 113 lb (51.3 kg)    Current diet: in general, a "healthy" diet    ------------------------------------------------------------------------  Follow up for Depression:  The patient was last seen for this 4 months ago. Changes made at last visit include increased Paxil to 40mg  daily.  She reports good compliance with treatment. She feels that condition is Unchanged. Patient states she couldn't tell a difference with the higher dose so she went back down to 20mg  daily.  She is not having side effects.   ------------------------------------------------------------------------------------  Allergies  Allergen Reactions  . Hydrocodone Swelling  . Penicillins Swelling  . Amoxicillin Swelling    Facial, and itching  . Hydrocodone Bitartrate Er Swelling  . Wellbutrin [Bupropion]     Makes her mean     Current Outpatient Medications:  .  albuterol (VENTOLIN HFA) 108 (90 Base) MCG/ACT inhaler, INHALE 2 PUFFS BY MOUTH EVERY 6 HOURS AS NEEDED FOR WHEEZING OR SHORTNESS OF BREATH, Disp: 8.5 g, Rfl: 5 .  benazepril (LOTENSIN) 5 MG tablet, Take 1 tablet (5 mg total) by mouth daily., Disp: 90 tablet, Rfl: 3 .  cyclobenzaprine (FLEXERIL) 5 MG tablet, Take 1-2 tablets (5-10 mg total) by mouth 3 (  three) times daily as needed (shoulder pain)., Disp: 30 tablet, Rfl: 1 .  dapagliflozin propanediol (FARXIGA) 10 MG TABS tablet, Take 10 mg by mouth daily before breakfast., Disp: 90 tablet, Rfl: 3 .  gabapentin (NEURONTIN) 300 MG capsule, Take 1 capsule (300 mg total) by mouth at bedtime., Disp: 90 capsule, Rfl: 3 .  JANUVIA 100 MG tablet, TAKE 1 TABLET BY MOUTH EVERY DAY (Patient taking  differently: Take 50 mg by mouth daily. ), Disp: 90 tablet, Rfl: 4 .  lovastatin (MEVACOR) 20 MG tablet, TAKE 1 TABLET BY MOUTH EVERY DAY, Disp: 90 tablet, Rfl: 3 .  meloxicam (MOBIC) 7.5 MG tablet, Take 1-2 tablets (7.5-15 mg total) by mouth daily as needed for pain., Disp: 30 tablet, Rfl: 4 .  metFORMIN (GLUCOPHAGE-XR) 500 MG 24 hr tablet, TAKE 2 TABLETS BY MOUTH TWICE A DAY, Disp: 360 tablet, Rfl: 4 .  PARoxetine (PAXIL) 20 MG tablet, Take 2 tablets (40 mg total) by mouth daily., Disp: 3 tablet, Rfl:   Review of Systems  Constitutional: Negative for appetite change, chills, fatigue and fever.  HENT: Positive for dental problem (swelling of the left hard palate x 1 week).   Respiratory: Negative for chest tightness and shortness of breath.   Cardiovascular: Negative for chest pain and palpitations.  Gastrointestinal: Negative for abdominal pain, nausea and vomiting.  Neurological: Negative for dizziness and weakness.    Social History   Tobacco Use  . Smoking status: Current Some Day Smoker    Packs/day: 0.25    Years: 42.00    Pack years: 10.50    Types: Cigarettes  . Smokeless tobacco: Never Used  . Tobacco comment: has a cigarette every few days  Substance Use Topics  . Alcohol use: Yes    Alcohol/week: 0.0 standard drinks    Comment: OCCASIONALLY      Objective:   BP 138/72 (BP Location: Left Arm, Patient Position: Sitting, Cuff Size: Normal)   Pulse 83   Temp (!) 97.5 F (36.4 C) (Temporal)   Resp 16   Wt 112 lb (50.8 kg)   SpO2 96% Comment: room air  BMI 21.87 kg/m  Vitals:   05/14/19 1607  BP: 138/72  Pulse: 83  Resp: 16  Temp: (!) 97.5 F (36.4 C)  TempSrc: Temporal  SpO2: 96%  Weight: 112 lb (50.8 kg)  Body mass index is 21.87 kg/m.   Physical Exam   General: Appearance:    Well developed, well nourished female in no acute distress  Eyes:    PERRL, conjunctiva/corneas clear, EOM's intact       Lungs:     Clear to auscultation bilaterally,  respirations unlabored  Heart:    Normal heart rate. Normal rhythm. No murmurs, rubs, or gallops.   MS:   All extremities are intact.   Neurologic:   Awake, alert, oriented x 3. No apparent focal neurological           defect.        Results for orders placed or performed in visit on 05/14/19  POCT HgB A1C  Result Value Ref Range   Hemoglobin A1C 8.3 (A) 4.0 - 5.6 %   Est. average glucose Bld gHb Est-mCnc 192        Assessment & Plan    1. Diabetes mellitus without complication (HCC) a1c is up since reducing Januvia. She did slip on her diet somewhat over the holidays. Will continue current meds for now and work on diet. Recheck a1c about 4 months.  Advised may need to add another medication if a1c continues to rise.   2. Essential (primary) hypertension Well controlled.  Continue current medications.    3. Hyperlipidemia, unspecified hyperlipidemia type She is tolerating lovastatin well with no adverse effects.   - Lipid panel - Comprehensive metabolic panel  4. Avitaminosis D  - VITAMIN D 25 Hydroxy (Vit-D Deficiency, Fractures)  5. Swelling of anterior hard palate She states she has been rinsing with salt water gargles and is improving, but still very sore. She has dentist appt next week. Will add - clindamycin (CLEOCIN) 300 MG capsule; Take 1 capsule (300 mg total) by mouth 3 (three) times daily for 7 days.  Dispense: 21 capsule; Refill: 0  The entirety of the information documented in the History of Present Illness, Review of Systems and Physical Exam were personally obtained by me. Portions of this information were initially documented by Meyer Cory, CMA and reviewed by me for thoroughness and accuracy.      Lelon Huh, MD  [facility]

## 2019-05-14 NOTE — Patient Instructions (Signed)
.   Please review the attached list of medications and notify my office if there are any errors.   . Please bring all of your medications to every appointment so we can make sure that our medication list is the same as yours.   

## 2019-05-19 LAB — LIPID PANEL
Chol/HDL Ratio: 3.1 ratio (ref 0.0–4.4)
Cholesterol, Total: 155 mg/dL (ref 100–199)
HDL: 50 mg/dL (ref >39)
LDL Chol Calc (NIH): 88 mg/dL (ref 0–99)
Triglycerides: 91 mg/dL (ref 0–149)
VLDL Cholesterol Cal: 17 mg/dL (ref 5–40)

## 2019-05-19 LAB — COMPREHENSIVE METABOLIC PANEL
ALT: 35 IU/L — ABNORMAL HIGH (ref 0–32)
AST: 27 IU/L (ref 0–40)
Albumin/Globulin Ratio: 1.8 (ref 1.2–2.2)
Albumin: 4.2 g/dL (ref 3.8–4.9)
Alkaline Phosphatase: 85 IU/L (ref 39–117)
BUN/Creatinine Ratio: 14 (ref 9–23)
BUN: 10 mg/dL (ref 6–24)
Bilirubin Total: 0.4 mg/dL (ref 0.0–1.2)
CO2: 23 mmol/L (ref 20–29)
Calcium: 9.7 mg/dL (ref 8.7–10.2)
Chloride: 103 mmol/L (ref 96–106)
Creatinine, Ser: 0.73 mg/dL (ref 0.57–1.00)
GFR calc Af Amer: 106 mL/min/{1.73_m2} (ref >59)
GFR calc non Af Amer: 92 mL/min/{1.73_m2} (ref >59)
Globulin, Total: 2.4 g/dL (ref 1.5–4.5)
Glucose: 179 mg/dL — ABNORMAL HIGH (ref 65–99)
Potassium: 4.6 mmol/L (ref 3.5–5.2)
Sodium: 142 mmol/L (ref 134–144)
Total Protein: 6.6 g/dL (ref 6.0–8.5)

## 2019-05-19 LAB — VITAMIN D 25 HYDROXY (VIT D DEFICIENCY, FRACTURES): Vit D, 25-Hydroxy: 21.5 ng/mL — ABNORMAL LOW (ref 30.0–100.0)

## 2019-05-20 ENCOUNTER — Other Ambulatory Visit: Payer: Self-pay | Admitting: Family Medicine

## 2019-05-20 DIAGNOSIS — J069 Acute upper respiratory infection, unspecified: Secondary | ICD-10-CM

## 2019-05-20 NOTE — Telephone Encounter (Signed)
Requested Prescriptions  Pending Prescriptions Disp Refills  . albuterol (VENTOLIN HFA) 108 (90 Base) MCG/ACT inhaler [Pharmacy Med Name: ALBUTEROL HFA INH (200 PUFFS)8.5GM] 8.5 g 5    Sig: INHALE 2 PUFFS BY MOUTH EVERY 6 HOURS AS NEEDED FOR WHEEZING OR SHORTNESS OF BREATH     Pulmonology:  Beta Agonists Failed - 05/20/2019 12:10 PM      Failed - One inhaler should last at least one month. If the patient is requesting refills earlier, contact the patient to check for uncontrolled symptoms.      Passed - Valid encounter within last 12 months    Recent Outpatient Visits          6 days ago Diabetes mellitus without complication [facility])   [facility] Malva Limes, MD   4 months ago Diabetes mellitus without complication [facility])   [facility] Malva Limes, MD   7 months ago Cervical cancer screening   [facility] Osvaldo Angst M, PA-C   7 months ago Hematuria, unspecified type   [facility] Osvaldo Angst M, PA-C   8 months ago Diabetes mellitus without complication [facility])   [facility] Malva Limes, MD      Future Appointments            In 3 months Fisher, Demetrios Isaacs, MD [facility], [facility]

## 2019-05-21 ENCOUNTER — Telehealth: Payer: Self-pay | Admitting: *Deleted

## 2019-05-21 ENCOUNTER — Telehealth: Payer: Self-pay

## 2019-05-21 NOTE — Telephone Encounter (Signed)
Pt returned call and lab message given to her with verbal understanding.  

## 2019-05-21 NOTE — Telephone Encounter (Signed)
-----   Message from Malva Limes, MD sent at 05/20/2019  8:15 AM EST ----- Vitamin d levels are low. She needs to start OTC vitamin D3 2000 units once a day. Rest of labs are good. Continue current medications.  Follow up in June as scheduled.

## 2019-05-21 NOTE — Telephone Encounter (Signed)
LM to return call. Okay for [facility] to advise patient.

## 2019-05-21 NOTE — Telephone Encounter (Signed)
Left pt. Message to call back. 

## 2019-08-01 ENCOUNTER — Ambulatory Visit: Payer: Self-pay | Admitting: *Deleted
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
## 2019-08-02 ENCOUNTER — Ambulatory Visit (INDEPENDENT_AMBULATORY_CARE_PROVIDER_SITE_OTHER): Payer: BC Managed Care – PPO | Admitting: Family Medicine

## 2019-08-02 ENCOUNTER — Encounter: Payer: Self-pay | Admitting: Family Medicine

## 2019-08-02 DIAGNOSIS — F430 Acute stress reaction: Secondary | ICD-10-CM | POA: Diagnosis not present

## 2019-08-02 DIAGNOSIS — F410 Panic disorder [episodic paroxysmal anxiety] without agoraphobia: Secondary | ICD-10-CM | POA: Diagnosis not present

## 2019-08-02 MED ORDER — PAROXETINE HCL 20 MG PO TABS: 40.0000 mg | ORAL_TABLET | Freq: Every day | ORAL | 2 refills | Status: DC

## 2019-08-02 MED ORDER — HYDROXYZINE PAMOATE 25 MG PO CAPS: 25.0000 mg | ORAL_CAPSULE | Freq: Every evening | ORAL | 0 refills | Status: DC | PRN
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
## 2019-08-02 NOTE — Telephone Encounter (Signed)
Acknowledged.

## 2019-08-04 ENCOUNTER — Other Ambulatory Visit: Payer: Self-pay | Admitting: Family Medicine

## 2019-08-04 DIAGNOSIS — F410 Panic disorder [episodic paroxysmal anxiety] without agoraphobia: Secondary | ICD-10-CM

## 2019-08-04 NOTE — Telephone Encounter (Signed)
Requested Prescriptions  Pending Prescriptions Disp Refills  . lovastatin (MEVACOR) 20 MG tablet [Pharmacy Med Name: LOVASTATIN 20 MG TABLET] 90 tablet 3    Sig: TAKE 1 TABLET BY MOUTH EVERY DAY     Cardiovascular:  Antilipid - Statins Failed - 08/04/2019 10:01 AM      Failed - LDL in normal range and within 360 days    LDL Chol Calc (NIH)  Date Value Ref Range Status  05/18/2019 88 0 - 99 mg/dL Final         Passed - Total Cholesterol in normal range and within 360 days    Cholesterol, Total  Date Value Ref Range Status  05/18/2019 155 100 - 199 mg/dL Final         Passed - HDL in normal range and within 360 days    HDL  Date Value Ref Range Status  05/18/2019 50 >39 mg/dL Final         Passed - Triglycerides in normal range and within 360 days    Triglycerides  Date Value Ref Range Status  05/18/2019 91 0 - 149 mg/dL Final         Passed - Patient is not pregnant      Passed - Valid encounter within last 12 months    Recent Outpatient Visits          2 days ago Acute reaction to situational stress   [employer] [place] E, PA   2 months ago Diabetes mellitus without complication [facility])   [facility] Malva Limes, MD   6 months ago Diabetes mellitus without complication [facility])   [facility] Malva Limes, MD   10 months ago Cervical cancer screening   [facility] Osvaldo Angst M, PA-C   10 months ago Hematuria, unspecified type   [facility] [place], Lavella Hammock, PA-C      Future Appointments            In 1 month Fisher, Demetrios Isaacs, MD [facility]           . PARoxetine (PAXIL) 20 MG tablet [Pharmacy Med Name: PAROXETINE HCL 20 MG TABLET] 90 tablet     Sig: TAKE 1 TABLET BY MOUTH EVERY DAY     Psychiatry:  Antidepressants - SSRI Failed - 08/04/2019 10:01 AM      Failed - Completed PHQ-2 or PHQ-9 in the last 360 days.      Passed - Valid encounter  within last 6 months    Recent Outpatient Visits          2 days ago Acute reaction to situational stress   [facility] Chrismon, Jodell Cipro, [place]   2 months ago Diabetes mellitus without complication [facility])   [facility] Malva Limes, MD   6 months ago Diabetes mellitus without complication [facility])   [facility] Malva Limes, MD   10 months ago Cervical cancer screening   [facility] Osvaldo Angst M, PA-C   10 months ago Hematuria, unspecified type   [facility], Lavella Hammock, PA-C      Future Appointments            In 1 month Fisher, Demetrios Isaacs, MD [facility], [facility]

## 2019-08-06 ENCOUNTER — Other Ambulatory Visit: Payer: Self-pay | Admitting: Family Medicine

## 2019-08-06 DIAGNOSIS — M797 Fibromyalgia: Secondary | ICD-10-CM

## 2019-08-06 DIAGNOSIS — F410 Panic disorder [episodic paroxysmal anxiety] without agoraphobia: Secondary | ICD-10-CM

## 2019-08-06 NOTE — Telephone Encounter (Signed)
Requested Prescriptions  Pending Prescriptions Disp Refills  . PARoxetine (PAXIL) 20 MG tablet [Pharmacy Med Name: PAROXETINE HCL 20 MG TABLET] 90 tablet 3    Sig: TAKE 1 TABLET BY MOUTH EVERY DAY     There is no refill protocol information for this order    . gabapentin (NEURONTIN) 300 MG capsule [Pharmacy Med Name: GABAPENTIN 300 MG CAPSULE] 90 capsule 3    Sig: TAKE 1 CAPSULE BY MOUTH EVERYDAY AT BEDTIME     There is no refill protocol information for this order

## 2019-08-12 ENCOUNTER — Other Ambulatory Visit: Payer: Self-pay | Admitting: Family Medicine

## 2019-08-12 DIAGNOSIS — F410 Panic disorder [episodic paroxysmal anxiety] without agoraphobia: Secondary | ICD-10-CM

## 2019-08-13 ENCOUNTER — Other Ambulatory Visit: Payer: Self-pay | Admitting: Family Medicine

## 2019-08-13 DIAGNOSIS — F410 Panic disorder [episodic paroxysmal anxiety] without agoraphobia: Secondary | ICD-10-CM

## 2019-08-13 MED ORDER — PAROXETINE HCL 40 MG PO TABS: 40.0000 mg | ORAL_TABLET | Freq: Every day | ORAL | 3 refills | Status: DC

## 2019-08-26 ENCOUNTER — Other Ambulatory Visit: Payer: Self-pay | Admitting: Family Medicine

## 2019-08-26 DIAGNOSIS — F430 Acute stress reaction: Secondary | ICD-10-CM

## 2019-08-26 NOTE — Telephone Encounter (Signed)
Requested Prescriptions  Pending Prescriptions Disp Refills  . hydrOXYzine (VISTARIL) 25 MG capsule [Pharmacy Med Name: HYDROXYZINE PAM 25 MG CAP] 90 capsule 1    Sig: TAKE 1 CAPSULE (25 MG TOTAL) BY MOUTH AT BEDTIME AS NEEDED. FOR SLEEP AND ANXIETY.     Ear, Nose, and Throat:  Antihistamines Passed - 08/26/2019  1:32 PM      Passed - Valid encounter within last 12 months    Recent Outpatient Visits          3 weeks ago Acute reaction to situational stress   [facility] Chrismon, [place] E, [place]   3 months ago Diabetes mellitus without complication [facility])   [facility] Malva Limes, MD   7 months ago Diabetes mellitus without complication [facility])   [facility] Malva Limes, MD   10 months ago Cervical cancer screening   [facility] Osvaldo Angst M, [place]   11 months ago Hematuria, unspecified type   [facility] [place], Lavella Hammock, [place]      Future Appointments            In 2 weeks Sherrie Mustache, Demetrios Isaacs, MD [facility], [facility]

## 2019-09-03 ENCOUNTER — Other Ambulatory Visit: Payer: Self-pay | Admitting: Family Medicine

## 2019-09-10 NOTE — Progress Notes (Signed)
Established patient visit   Patient: Dana Weber   DOB: [birth year]   59 y.o. Female  MRN: [id number] Visit Date: 09/11/2019  Today's healthcare provider: Lelon Huh, MD   Chief Complaint  Patient presents with   Diabetes   Hypertension   Hyperlipidemia   Mertie Moores as a scribe for Lelon Huh, MD.,have documented all relevant documentation on the behalf of Lelon Huh, MD,as directed by  Lelon Huh, MD while in the presence of Lelon Huh, MD.  Subjective    HPI Diabetes Mellitus Type II, follow-up  Lab Results  Component Value Date   HGBA1C 8.3 (A) 05/14/2019   HGBA1C 7.6 (A) 01/08/2019   HGBA1C 7.4 (A) 08/29/2018   Last seen for diabetes 4 months ago.  Management since then includes continuing the same treatment. She reports good compliance with treatment. She is not having side effects.   Home blood sugar records: being checked occasionally   Episodes of hypoglycemia? No    Current insulin regiment: none Most Recent Eye Exam: 02/12/2019  --------------------------------------------------------------------------------------------------- Hypertension, follow-up  BP Readings from Last 3 Encounters:  09/11/19 (!) 149/76  05/14/19 138/72  01/08/19 110/62   Wt Readings from Last 3 Encounters:  09/11/19 114 lb 3.2 oz (51.8 kg)  05/14/19 112 lb (50.8 kg)  01/08/19 113 lb (51.3 kg)     She was last seen for hypertension 4 months ago.  BP at that visit was 138/712. Management since that visit includes no change. She reports good compliance with treatment. She is not having side effects.  She is not exercising. She is not adherent to low salt diet.   Outside blood pressures are not being checked .  She does smoke.  Use of agents associated with hypertension: none.   --------------------------------------------------------------------------------------------------- Lipid/Cholesterol, follow-up  Last Lipid Panel: Lab  Results  Component Value Date   CHOL 155 05/18/2019   LDLCALC 88 05/18/2019   HDL 50 05/18/2019   TRIG 91 05/18/2019    She was last seen for this 4 months ago.  Management since that visit includes no change.  She reports good compliance with treatment. She is not having side effects.   Symptoms: No appetite changes No foot ulcerations  No chest pain No chest pressure/discomfort  No dyspnea No orthopnea  No fatigue No lower extremity edema  No palpitations No paroxysmal nocturnal dyspnea  No nausea No numbness or tingling of extremity  No polydipsia No polyuria  No speech difficulty No syncope   She is following a Regular diet. Current exercise: none  Last metabolic panel Lab Results  Component Value Date   GLUCOSE 179 (H) 05/18/2019   NA 142 05/18/2019   K 4.6 05/18/2019   BUN 10 05/18/2019   CREATININE 0.73 05/18/2019   GFRNONAA 92 05/18/2019   GFRAA 106 05/18/2019   CALCIUM 9.7 05/18/2019   AST 27 05/18/2019   ALT 35 (H) 05/18/2019   The 10-year ASCVD risk score Mikey Bussing DC Jr., et al., 2013) is: 17.1%  --------------------------------------------------------------------------------------------------- Follow up for Vitamin D deficiency:  The patient was last seen for this 4 months ago. Changes made at last visit include no change.  She reports good compliance with treatment. She feels that condition is stable. She is not having side effects.   ----------------------------------------------------------------------------------------- Follow up situational stress and anxiety. Has a lot more stress the last few months since her mother passed away. Has had a hard time sleeping, both falling asleep and waking up throughout the night. She  had telephone visit with Maurine Minister Chrismon in may and had paroxetine doubled to 40mg . She was also prescribed hydroxyzine which helped the first few times she took, but then seemed to stop working and she states she just feels  lethargic and sleepy all day long.   She also brings in report from [facility] which indicated significant left carotid artery disease. She does have frequent numbness in her hands and neck. She does have multiple risk factors for vascular disease.   She is also concerned about osteoporosis which runs strong in her family. She states her mother was in injections and she states her father had severe kyphosis. She also had hysterectomy in 2008 and early monopause.   She also reports frequent episodes of loose crampy, oily stools, and other times when she goes for days without bowel movement and feels constipated. She read an article about exocrine pancreatic insufficiency and is concerned she may have it.       Medications: Outpatient Medications Prior to Visit  Medication Sig   albuterol (VENTOLIN HFA) 108 (90 Base) MCG/ACT inhaler INHALE 2 PUFFS BY MOUTH EVERY 6 HOURS AS NEEDED FOR WHEEZING OR SHORTNESS OF BREATH   benazepril (LOTENSIN) 5 MG tablet Take 1 tablet (5 mg total) by mouth daily.   cyclobenzaprine (FLEXERIL) 5 MG tablet Take 1-2 tablets (5-10 mg total) by mouth 3 (three) times daily as needed (shoulder pain).   dapagliflozin propanediol (FARXIGA) 10 MG TABS tablet Take 10 mg by mouth daily before breakfast.   gabapentin (NEURONTIN) 300 MG capsule TAKE 1 CAPSULE BY MOUTH EVERYDAY AT BEDTIME   hydrOXYzine (VISTARIL) 25 MG capsule TAKE 1 CAPSULE (25 MG TOTAL) BY MOUTH AT BEDTIME AS NEEDED. FOR SLEEP AND ANXIETY.   JANUVIA 100 MG tablet TAKE 1 TABLET BY MOUTH EVERY DAY (Patient taking differently: Take 50 mg by mouth daily. )   lovastatin (MEVACOR) 20 MG tablet TAKE 1 TABLET BY MOUTH EVERY DAY   meloxicam (MOBIC) 7.5 MG tablet Take 1-2 tablets (7.5-15 mg total) by mouth daily as needed for pain.   metFORMIN (GLUCOPHAGE-XR) 500 MG 24 hr tablet TAKE 2 TABLETS BY MOUTH TWICE A DAY   PARoxetine (PAXIL) 40 MG tablet Take 1 tablet (40 mg total) by mouth daily.   No  facility-administered medications prior to visit.    Review of Systems    Objective    BP (!) 149/76 (BP Location: Right Arm, Patient Position: Sitting, Cuff Size: Normal)    Pulse 87    Temp (!) 97.5 F (36.4 C) (Temporal)    Ht 5' (1.524 m)    Wt 114 lb 3.2 oz (51.8 kg)    BMI 22.30 kg/m    Physical Exam   General: Appearance:    Well developed, well nourished female in no acute distress  Eyes:    PERRL, conjunctiva/corneas clear, EOM's intact       Lungs:     Clear to auscultation bilaterally, respirations unlabored  Heart:    Normal heart rate. Normal rhythm. No murmurs, rubs, or gallops.   MS:   All extremities are intact.   Neurologic:   Awake, alert, oriented x 3. No apparent focal neurological           defect.        Results for orders placed or performed in visit on 09/11/19  POCT HgB A1C  Result Value Ref Range   Hemoglobin A1C 7.9 (A) 4.0 - 5.6 %   Est. average glucose Bld gHb Est-mCnc 180  Assessment & Plan     1. Diabetes mellitus without complication (HCC) a1c is improving Continue current medications.    Return for diabetes follow up in 3 months.   2. Essential (primary) hypertension SBP elevated today, but she reports she has been under a lot of stress and BP is usually better. Continue current medications for now.   3. Hyperlipidemia, unspecified hyperlipidemia type She is tolerating lovastatin well with no adverse effects.    4. Insomnia, unspecified type Likely secondary to psychosocial factors. No improvement with hydroxyzine. Continue increased dose of paroxetine for the time being.  - traZODone (DESYREL) 100 MG tablet; Take 0.5-1 tablets (50-100 mg total) by mouth at bedtime.  Dispense: 30 tablet; Refill: 1  5. Bilateral carotid bruits  - US Carotid Duplex Bilateral; Future  6. Estrogen deficiency  - DG Bone Density; Future  7. 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  - Ambulatory referral to  Gastroenterology  Addressed extensive list of chronic and acute medical problems today requiring 45 minutes reviewing her medical record, counseling patient regarding her conditions and coordination of care.    No follow-ups on file.       The entirety of the information documented in the History of Present Illness, Review of Systems and Physical Exam were personally obtained by me. Portions of this information were initially documented by the CMA and reviewed by me for thoroughness and accuracy.      Mila Merry, MD  [facility] [phone] (phone) [phone] (fax)  [facility]

## 2019-09-11 ENCOUNTER — Ambulatory Visit (INDEPENDENT_AMBULATORY_CARE_PROVIDER_SITE_OTHER): Payer: BC Managed Care – PPO | Admitting: Family Medicine

## 2019-09-11 ENCOUNTER — Encounter: Payer: Self-pay | Admitting: Family Medicine

## 2019-09-11 ENCOUNTER — Other Ambulatory Visit: Payer: Self-pay

## 2019-09-11 VITALS — BP 149/76 | HR 87 | Temp 97.5°F | Ht 60.0 in | Wt 114.2 lb

## 2019-09-11 DIAGNOSIS — R194 Change in bowel habit: Secondary | ICD-10-CM

## 2019-09-11 DIAGNOSIS — E785 Hyperlipidemia, unspecified: Secondary | ICD-10-CM

## 2019-09-11 DIAGNOSIS — I10 Essential (primary) hypertension: Secondary | ICD-10-CM

## 2019-09-11 DIAGNOSIS — E119 Type 2 diabetes mellitus without complications: Secondary | ICD-10-CM

## 2019-09-11 DIAGNOSIS — R0989 Other specified symptoms and signs involving the circulatory and respiratory systems: Secondary | ICD-10-CM

## 2019-09-11 DIAGNOSIS — G4700 Insomnia, unspecified: Secondary | ICD-10-CM

## 2019-09-11 DIAGNOSIS — E2839 Other primary ovarian failure: Secondary | ICD-10-CM

## 2019-09-11 LAB — POCT GLYCOSYLATED HEMOGLOBIN (HGB A1C)
Est. average glucose Bld gHb Est-mCnc: 180
Hemoglobin A1C: 7.9 % — AB (ref 4.0–5.6)

## 2019-09-11 MED ORDER — TRAZODONE HCL 100 MG PO TABS: 50.0000 mg | ORAL_TABLET | Freq: Every day | ORAL | 1 refills | Status: DC

## 2019-09-12 ENCOUNTER — Other Ambulatory Visit: Payer: Self-pay | Admitting: Family Medicine

## 2019-09-12 DIAGNOSIS — F410 Panic disorder [episodic paroxysmal anxiety] without agoraphobia: Secondary | ICD-10-CM

## 2019-09-12 NOTE — Telephone Encounter (Signed)
Requested medication (s) are due for refill today:  No  Requested medication (s) are on the active medication list:  yes  Future visit scheduled:  yes  Last Refill: 08/13/19; #30; RF x 3  Notes to clinic:  Pt. Was seen by Dortha Kern on 5/13; Paroxetine was refilled 5/24 with #30 w/ 3 refills; pt. is requesting a 90 day supply; please advise.  Requested Prescriptions  Pending Prescriptions Disp Refills   PARoxetine (PAXIL) 40 MG tablet [Pharmacy Med Name: PAROXETINE HCL 40 MG TABLET] 90 tablet 2    Sig: TAKE 1 TABLET BY MOUTH EVERY DAY      Psychiatry:  Antidepressants - SSRI Failed - 09/12/2019 10:16 AM      Failed - Completed PHQ-2 or PHQ-9 in the last 360 days.      Passed - Valid encounter within last 6 months    Recent Outpatient Visits           Yesterday Diabetes mellitus without complication [facility])   [facility] Malva Limes, MD   1 month ago Acute reaction to situational stress   [facility] Chrismon, Jodell Cipro, [place]   4 months ago Diabetes mellitus without complication [facility])   [facility] Malva Limes, [facility]   8 months ago Diabetes mellitus without complication [facility])   [facility]va Limes, MD   11 months ago Cervical cancer screening   [facility] Trey Sailors, [place]       Future Appointments             In 4 months Fisher, Demetrios Isaacs, MD [facility]

## 2019-09-17 ENCOUNTER — Other Ambulatory Visit: Payer: Self-pay

## 2019-09-17 ENCOUNTER — Ambulatory Visit
Admission: RE | Admit: 2019-09-17 | Discharge: 2019-09-17 | Disposition: A | Discharge status: Home / Self Care | Payer: BC Managed Care – PPO | Source: Ambulatory Visit | Attending: Family Medicine | Admitting: Family Medicine

## 2019-09-17 DIAGNOSIS — R0989 Other specified symptoms and signs involving the circulatory and respiratory systems: Secondary | ICD-10-CM | POA: Diagnosis present

## 2019-09-18 ENCOUNTER — Encounter: Payer: Self-pay | Admitting: Family Medicine

## 2019-09-18 ENCOUNTER — Telehealth: Payer: Self-pay

## 2019-09-18 ENCOUNTER — Other Ambulatory Visit: Payer: Self-pay | Admitting: Family Medicine

## 2019-09-18 DIAGNOSIS — I6522 Occlusion and stenosis of left carotid artery: Secondary | ICD-10-CM

## 2019-09-18 NOTE — Telephone Encounter (Signed)
-----   Message from Malva Limes, MD sent at 09/18/2019  7:16 AM EDT ----- >50% blockage of left carotid artery which presents increased risk for future stroke. Needs. Need to take 81mg  coated aspirin daily and refer to vascular surgery for further evaluation. Have placer order for referral.

## 2019-09-18 NOTE — Telephone Encounter (Signed)
Patient advised.

## 2019-10-01 ENCOUNTER — Telehealth: Payer: Self-pay

## 2019-10-01 DIAGNOSIS — Z87891 Personal history of nicotine dependence: Secondary | ICD-10-CM

## 2019-10-01 DIAGNOSIS — Z122 Encounter for screening for malignant neoplasm of respiratory organs: Secondary | ICD-10-CM

## 2019-10-01 NOTE — Telephone Encounter (Signed)
Patient has been notified that the low dose lung cancer screening CT scan is due currently or will be in near future.  Confirmed that patient is within the appropriate age range and asymptomatic, (no signs or symptoms of lung cancer).  Patient denies illness that would prevent curative treatment for lung cancer if found.  Patient is agreeable for CT scan being scheduled.    Verified smoking history (current smoker, with 42 year 1 ppd history).   CT scheduled for 10/11/19 @ 1:15

## 2019-10-02 NOTE — Addendum Note (Signed)
Addended by: Jonne Ply on: 10/02/2019 11:16 AM   Modules accepted: Orders

## 2019-10-02 NOTE — Telephone Encounter (Signed)
Smoking history: current, 85.5 pack year °

## 2019-10-04 ENCOUNTER — Other Ambulatory Visit: Payer: Self-pay | Admitting: Family Medicine

## 2019-10-04 DIAGNOSIS — G4700 Insomnia, unspecified: Secondary | ICD-10-CM

## 2019-10-04 NOTE — Telephone Encounter (Signed)
Pharmacy is requesting a 90 day supply. The last refill sent on 09/11/2019 was for a qty of 30 with 1 refill. Please advise.

## 2019-10-04 NOTE — Telephone Encounter (Signed)
Requested medication (s) are due for refill today: no  Requested medication (s) are on the active medication list: yes  Last refill:  09/11/2019  Future visit scheduled: yes  Notes to clinic:  REQUEST FOR 90 DAYS PRESCRIPTION. DX Code Needed.   Requested Prescriptions  Pending Prescriptions Disp Refills   traZODone (DESYREL) 100 MG tablet [Pharmacy Med Name: TRAZODONE 100 MG TABLET] 90 tablet 1    Sig: TAKE 1/2 TO 1 TABLET BY MOUTH AT BEDTIME      Psychiatry: Antidepressants - Serotonin Modulator Failed - 10/04/2019 11:34 AM      Failed - Completed PHQ-2 or PHQ-9 in the last 360 days.      Passed - Valid encounter within last 6 months    Recent Outpatient Visits           3 weeks ago Diabetes mellitus without complication [facility])   [facility] Malva Limes, MD   2 months ago Acute reaction to situational stress   [facility] Chrismon, Jodell Cipro, [place]   4 months ago Diabetes mellitus without complication [facility])   [facility] Malva Limes, [place]   8 months ago Diabetes mellitus without complication [facility])   [facility] Malva Limes, MD   12 months ago Cervical cancer screening   [facility] Trey Sailors, [place]       Future Appointments             In 3 months Fisher, Demetrios Isaacs, MD [facility]

## 2019-10-10 NOTE — Progress Notes (Signed)
MRN : [id number]  Dana Weber is a 59 y.o. ([birth year]) female who presents with chief complaint of No chief complaint on file. Marland Kitchen  History of Present Illness:   The patient is seen for evaluation of carotid stenosis. The carotid stenosis was identified after duplex ultrasound was obtained for a left carotid bruit.  The patient denies amaurosis fugax. There is no recent history of TIA symptoms or focal motor deficits. There is no prior documented CVA.  There is no history of migraine headaches. There is no history of seizures.  The patient is taking enteric-coated aspirin 81 mg daily.  The patient has a history of coronary artery disease, no recent episodes of angina or shortness of breath. The patient denies PAD or claudication symptoms. There is a history of hyperlipidemia which is being treated with a statin.   Duplex ultrasound of the carotid arteries obtained 09/17/2019 shows <40% RICA and 60-79% LICA  Antegrade vertebral arteries bilaterally  No outpatient medications have been marked as taking for the 10/11/19 encounter (Appointment) with Gilda Crease, Latina Craver, MD.    Past Medical History:  Diagnosis Date  . Diabetes mellitus without complication (HCC)   . Fatty liver   . Headache    sinus  . Hyperlipidemia   . Hypertension   . Multilevel degenerative disc disease    "spurs" on spine also  . Vertigo    mild - daily - positional  . Wears dentures    partial lower    Past Surgical History:  Procedure Laterality Date  . CESAREAN SECTION    . COLONOSCOPY WITH PROPOFOL N/A 03/20/2015   Procedure: COLONOSCOPY WITH PROPOFOL;  Surgeon: Midge Minium, MD;  Location: [facility];  Service: Endoscopy;  Laterality: N/A;  . DILATION AND CURETTAGE OF UTERUS    . ESOPHAGOGASTRODUODENOSCOPY (EGD) WITH PROPOFOL N/A 03/20/2015   Procedure: ESOPHAGOGASTRODUODENOSCOPY (EGD) WITH PROPOFOL;  Surgeon: Midge Minium, MD;  Location: [facility];  Service: Endoscopy;   Laterality: N/A;  Diabetic - oral meds  . HEMORRHOID SURGERY    . LAPAROSCOPIC CHOLECYSTECTOMY    . LAPAROSCOPIC SUPRACERVICAL HYSTERECTOMY  2008  . SALPINGOOPHORECTOMY Left 2008  . TONSILLECTOMY AND ADENOIDECTOMY  1960    Social History Social History   Tobacco Use  . Smoking status: Current Some Day Smoker    Packs/day: 0.25    Years: 42.00    Pack years: 10.50    Types: Cigarettes  . Smokeless tobacco: Never Used  . Tobacco comment: has a cigarette every few days  Vaping Use  . Vaping Use: Never used  Substance Use Topics  . Alcohol use: Yes    Alcohol/week: 0.0 standard drinks    Comment: OCCASIONALLY  . Drug use: No    Family History Family History  Problem Relation Age of Onset  . Diabetes Mother   . Hypertension Mother   . Diabetes Father   . Hypertension Father   . Hypertension Brother   . Diabetes Paternal Grandmother   . Emphysema Paternal Grandfather   . Hypertension Brother   No family history of bleeding/clotting disorders, porphyria or autoimmune disease   Allergies  Allergen Reactions  . Hydrocodone Swelling  . Penicillins Swelling  . Amoxicillin Swelling    Facial, and itching  . Hydrocodone Bitartrate Er Swelling  . Wellbutrin [Bupropion]     Makes her mean     REVIEW OF SYSTEMS (Negative unless checked)  Constitutional: [] Weight loss  [] Fever  [] Chills Cardiac: [] Chest pain   [] Chest pressure   []   Palpitations   [] Shortness of breath when laying flat   [] Shortness of breath with exertion. Vascular:  [] Pain in legs with walking   [] Pain in legs at rest  [] History of DVT   [] Phlebitis   [] Swelling in legs   [] Varicose veins   [] Non-healing ulcers Pulmonary:   [] Uses home oxygen   [] Productive cough   [] Hemoptysis   [] Wheeze  [] COPD   [] Asthma Neurologic:  [] Dizziness   [] Seizures   [] History of stroke   [] History of TIA  [] Aphasia   [] Vissual changes   [] Weakness or numbness in arm   [] Weakness or numbness in leg Musculoskeletal:   [] Joint  swelling   [] Joint pain   [] Low back pain Hematologic:  [] Easy bruising  [] Easy bleeding   [] Hypercoagulable state   [] Anemic Gastrointestinal:  [] Diarrhea   [] Vomiting  [] Gastroesophageal reflux/heartburn   [] Difficulty swallowing. Genitourinary:  [] Chronic kidney disease   [] Difficult urination  [] Frequent urination   [] Blood in urine Skin:  [] Rashes   [] Ulcers  Psychological:  [] History of anxiety   []  History of major depression.  Physical Examination  There were no vitals filed for this visit. There is no height or weight on file to calculate BMI. Gen: WD/WN, NAD Head: [place]/AT, No temporalis wasting.  Ear/Nose/Throat: Hearing grossly intact, nares w/o erythema or drainage, poor dentition Eyes: PER, EOMI, sclera nonicteric.  Neck: Supple, no masses.  No bruit or JVD.  Pulmonary:  Good air movement, clear to auscultation bilaterally, no use of accessory muscles.  Cardiac: RRR, normal S1, S2, no Murmurs. Vascular: carotid bruit Vessel Right Left  Radial Palpable Palpable  Brachial Palpable Palpable  Carotid Palpable Palpable  Gastrointestinal: soft, non-distended. No guarding/no peritoneal signs.  Musculoskeletal: M/S 5/5 throughout.  No deformity or atrophy.  Neurologic: CN 2-12 intact. Pain and light touch intact in extremities.  Symmetrical.  Speech is fluent. Motor exam as listed above. Psychiatric: Judgment intact, Mood & affect appropriate for pt's clinical situation. Dermatologic: No rashes or ulcers noted.  No changes consistent with cellulitis.   CBC Lab Results  Component Value Date   WBC 7.5 05/03/2016   HGB 14.1 05/03/2016   HCT 41.3 05/03/2016   MCV 87 05/03/2016   PLT 355 05/03/2016    BMET    Component Value Date/Time   NA 142 05/18/2019 0828   K 4.6 05/18/2019 0828   CL 103 05/18/2019 0828   CO2 23 05/18/2019 0828   GLUCOSE 179 (H) 05/18/2019 0828   BUN 10 05/18/2019 0828   CREATININE 0.73 05/18/2019 0828   CALCIUM 9.7 05/18/2019 0828   GFRNONAA 92  05/18/2019 0828   GFRAA 106 05/18/2019 0828   CrCl cannot be calculated (Patient's most recent lab result is older than the maximum 21 days allowed.).  COAG No results found for: INR, PROTIME  Radiology Carotid Duplex Bilateral  Result Date: 09/17/2019 CLINICAL DATA:  59 year old female with a history of left carotid bruit EXAM: BILATERAL CAROTID DUPLEX ULTRASOUND TECHNIQUE: scale imaging, color Doppler and duplex ultrasound were performed of bilateral carotid and vertebral arteries in the neck. COMPARISON:  None. FINDINGS: Criteria: Quantification of carotid stenosis is based on velocity parameters that correlate the residual internal carotid diameter with NASCET-based stenosis levels, using the diameter of the distal internal carotid lumen as the denominator for stenosis measurement. The following velocity measurements were obtained: RIGHT ICA:  Systolic 82 cm/sec, Diastolic 23 cm/sec CCA:  98 cm/sec SYSTOLIC ICA/CCA RATIO:  0.8 ECA:  76 cm/sec LEFT ICA:  Systolic  176 cm/sec, Diastolic 58 cm/sec CCA:  64 cm/sec SYSTOLIC ICA/CCA RATIO:  2.7 ECA:  151 cm/sec Right Brachial SBP: Not acquired Left Brachial SBP: Not acquired RIGHT CAROTID ARTERY: No significant calcifications of the right common carotid artery. Intermediate waveform maintained. Moderate heterogeneous and partially calcified plaque at the right carotid bifurcation. No significant lumen shadowing. Low resistance waveform of the right ICA. No significant tortuosity. RIGHT VERTEBRAL ARTERY: Antegrade flow with low resistance waveform. LEFT CAROTID ARTERY: No significant calcifications of the left common carotid artery. Intermediate waveform maintained. Moderate heterogeneous and partially calcified plaque at the left carotid bifurcation. No significant lumen shadowing. Low resistance waveform of the left ICA. No significant tortuosity. LEFT VERTEBRAL ARTERY:  Antegrade flow with low resistance waveform. IMPRESSION: Right: Color duplex  indicates moderate heterogeneous and calcified plaque, with no hemodynamically significant stenosis by duplex criteria in the extracranial cerebrovascular circulation. Left: Heterogeneous and partially calcified plaque at the left carotid bifurcation contributes to 50%-69% stenosis by established duplex criteria Signed, Yvone Neu. Reyne Dumas, RPVI [facility] Radiology Electronically Signed   By: Gilmer Mor D.O.   On: 09/17/2019 16:19     Assessment/Plan 1. Carotid artery stenosis, asymptomatic, left Recommend:  Given the patient's asymptomatic subcritical stenosis no further invasive testing or surgery at this time.  Duplex ultrasound shows <40% RICA and 60-79% LICA stenosis.  Continue antiplatelet therapy as prescribed Continue management of CAD, HTN and Hyperlipidemia Healthy heart diet,  encouraged exercise at least 4 times per week Follow up in 6 months with duplex ultrasound and physical exam.   - VAS US CAROTID; Future  2. Essential (primary) hypertension Continue antihypertensive medications as already ordered, these medications have been reviewed and there are no changes at this time.   3. Diabetes mellitus without complication (HCC) Continue hypoglycemic medications as already ordered, these medications have been reviewed and there are no changes at this time.  Hgb A1C to be monitored as already arranged by primary service   4. Hyperlipidemia, unspecified hyperlipidemia type Continue statin as ordered and reviewed, no changes at this time     Levora Dredge, MD  10/10/2019 2:48 PM

## 2019-10-11 ENCOUNTER — Ambulatory Visit
Admission: RE | Admit: 2019-10-11 | Discharge: 2019-10-11 | Disposition: A | Discharge status: Home / Self Care | Payer: BC Managed Care – PPO | Source: Ambulatory Visit | Attending: Oncology | Admitting: Oncology

## 2019-10-11 ENCOUNTER — Other Ambulatory Visit: Payer: Self-pay

## 2019-10-11 ENCOUNTER — Ambulatory Visit (INDEPENDENT_AMBULATORY_CARE_PROVIDER_SITE_OTHER): Payer: BC Managed Care – PPO | Admitting: Vascular Surgery

## 2019-10-11 ENCOUNTER — Encounter (INDEPENDENT_AMBULATORY_CARE_PROVIDER_SITE_OTHER): Payer: Self-pay | Admitting: Vascular Surgery

## 2019-10-11 VITALS — BP 156/77 | HR 70 | Resp 16 | Ht 60.0 in | Wt 111.0 lb

## 2019-10-11 DIAGNOSIS — Z122 Encounter for screening for malignant neoplasm of respiratory organs: Secondary | ICD-10-CM

## 2019-10-11 DIAGNOSIS — I10 Essential (primary) hypertension: Secondary | ICD-10-CM

## 2019-10-11 DIAGNOSIS — E119 Type 2 diabetes mellitus without complications: Secondary | ICD-10-CM | POA: Diagnosis not present

## 2019-10-11 DIAGNOSIS — E785 Hyperlipidemia, unspecified: Secondary | ICD-10-CM | POA: Diagnosis not present

## 2019-10-11 DIAGNOSIS — I6522 Occlusion and stenosis of left carotid artery: Secondary | ICD-10-CM

## 2019-10-11 DIAGNOSIS — Z87891 Personal history of nicotine dependence: Secondary | ICD-10-CM | POA: Diagnosis not present

## 2019-10-15 ENCOUNTER — Encounter: Payer: Self-pay | Admitting: *Deleted

## 2019-10-22 LAB — HM MAMMOGRAPHY

## 2019-10-24 ENCOUNTER — Telehealth: Payer: Self-pay | Admitting: Family Medicine

## 2019-10-24 NOTE — Telephone Encounter (Signed)
Please advise mammogram from [facility] is normal. Repeat 1 year.

## 2019-10-24 NOTE — Telephone Encounter (Signed)
Left voicemail advising patient that mammogram results are normal.

## 2019-11-15 ENCOUNTER — Ambulatory Visit: Payer: BC Managed Care – PPO | Admitting: Gastroenterology

## 2019-11-28 ENCOUNTER — Other Ambulatory Visit: Payer: Self-pay | Admitting: Family Medicine

## 2019-11-28 NOTE — Telephone Encounter (Signed)
Requested Prescriptions  Pending Prescriptions Disp Refills   metFORMIN (GLUCOPHAGE-XR) 500 MG 24 hr tablet [Pharmacy Med Name: METFORMIN HCL ER 500 MG TABLET] 360 tablet 1    Sig: TAKE 2 TABLETS BY MOUTH TWICE A DAY     Endocrinology:  Diabetes - Biguanides Passed - 11/28/2019  1:23 AM      Passed - Cr in normal range and within 360 days    Creatinine, Ser  Date Value Ref Range Status  05/18/2019 0.73 0.57 - 1.00 mg/dL Final   Creatinine, POC  Date Value Ref Range Status  03/13/2018 n/a mg/dL Final         Passed - HBA1C is between 0 and 7.9 and within 180 days    Hemoglobin A1C  Date Value Ref Range Status  09/11/2019 7.9 (A) 4.0 - 5.6 % Final  04/30/2016 7.5  Final         Passed - eGFR in normal range and within 360 days    GFR calc Af Amer  Date Value Ref Range Status  05/18/2019 106 >59 mL/min/1.73 Final   GFR calc non Af Amer  Date Value Ref Range Status  05/18/2019 92 >59 mL/min/1.73 Final         Passed - Valid encounter within last 6 months    Recent Outpatient Visits          2 months ago Diabetes mellitus without complication [facility])   [facility] Birdie Sons, MD   3 months ago Acute reaction to situational stress   [place], [place] E, [place]   6 months ago Diabetes mellitus without complication [facility])   [facility] Birdie Sons, MD   10 months ago Diabetes mellitus without complication [facility])   [facility] Birdie Sons, MD   1 year ago Cervical cancer screening   [facility] Post, [place]      Future Appointments            In 1 month Fisher, Kirstie Peri, MD [facility], [place]

## 2019-12-13 ENCOUNTER — Other Ambulatory Visit: Payer: Self-pay | Admitting: Family Medicine

## 2019-12-13 DIAGNOSIS — J069 Acute upper respiratory infection, unspecified: Secondary | ICD-10-CM

## 2019-12-13 NOTE — Telephone Encounter (Signed)
Requested  medications are  due for refill today yes  Requested medications are on the active medication list yes  Last refill 8/30  Last visit Do not see this med/dx addressed in an office visit  Future visit scheduled 12/2019  Notes to clinic Do not see valid OV and pt is asking for inhaler early, please assess.

## 2019-12-23 ENCOUNTER — Emergency Department: Payer: BC Managed Care – PPO

## 2019-12-23 ENCOUNTER — Other Ambulatory Visit: Payer: Self-pay

## 2019-12-23 ENCOUNTER — Encounter: Payer: Self-pay | Admitting: Emergency Medicine

## 2019-12-23 ENCOUNTER — Emergency Department
Admission: EM | Admit: 2019-12-23 | Discharge: 2019-12-23 | Disposition: A | Discharge status: Home / Self Care | Payer: BC Managed Care – PPO | Attending: Emergency Medicine | Admitting: Emergency Medicine

## 2019-12-23 DIAGNOSIS — E119 Type 2 diabetes mellitus without complications: Secondary | ICD-10-CM | POA: Diagnosis not present

## 2019-12-23 DIAGNOSIS — Z7984 Long term (current) use of oral hypoglycemic drugs: Secondary | ICD-10-CM | POA: Diagnosis not present

## 2019-12-23 DIAGNOSIS — I10 Essential (primary) hypertension: Secondary | ICD-10-CM | POA: Insufficient documentation

## 2019-12-23 DIAGNOSIS — Z20822 Contact with and (suspected) exposure to covid-19: Secondary | ICD-10-CM | POA: Diagnosis not present

## 2019-12-23 DIAGNOSIS — R0981 Nasal congestion: Secondary | ICD-10-CM | POA: Insufficient documentation

## 2019-12-23 DIAGNOSIS — F17210 Nicotine dependence, cigarettes, uncomplicated: Secondary | ICD-10-CM | POA: Insufficient documentation

## 2019-12-23 DIAGNOSIS — R059 Cough, unspecified: Secondary | ICD-10-CM

## 2019-12-23 LAB — RESPIRATORY PANEL BY RT PCR (FLU A&B, COVID)
Influenza A by PCR: NEGATIVE
Influenza B by PCR: NEGATIVE
SARS Coronavirus 2 by RT PCR: NEGATIVE

## 2019-12-23 LAB — GROUP A STREP BY PCR: Group A Strep by PCR: NOT DETECTED

## 2019-12-23 MED ORDER — DOXYCYCLINE MONOHYDRATE 100 MG PO TABS
100.0000 mg | ORAL_TABLET | Freq: Two times a day (BID) | ORAL | 0 refills | Status: AC
Start: 2019-12-23 — End: 2019-12-30

## 2019-12-23 MED ORDER — PREDNISONE 10 MG PO TABS: ORAL_TABLET | ORAL | 0 refills | Status: AC

## 2019-12-23 MED ORDER — SPACER/AERO-HOLDING CHAMBERS DEVI: 1.0000 | Freq: Once | 0 refills | Status: AC

## 2019-12-23 MED ORDER — BENZONATATE 100 MG PO CAPS
100.0000 mg | ORAL_CAPSULE | Freq: Three times a day (TID) | ORAL | 0 refills | Status: DC | PRN
Start: 2019-12-23 — End: 2020-08-13

## 2019-12-23 NOTE — ED Triage Notes (Signed)
Pt arrives ambulatory to triage with c/o congestion since Wednesday. Pt went to Urgent Care on Thursday and was given prednisone without relief. Pt is in NAD and speaking in complete sentences at this time.

## 2019-12-23 NOTE — ED Provider Notes (Signed)
[facility] Emergency Department Provider Note  ____________________________________________   First MD Initiated Contact with Patient 12/23/19 2218     (approximate)  I have reviewed the triage vital signs and the nursing notes.   HISTORY  Chief Complaint Nasal Congestion  HPI Dana Weber is a 59 y.o. female who reports to the emergency department for evaluation of illness since this past Tuesday.  She started feeling generally unwell on Tuesday and began with nasal congestion on Wednesday.  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  This returned negative.  The following day on Thursday she was seen in urgent care where she was treated with a 4-day course of prednisone as well as Bromfed and an albuterol inhaler.  The patient feels that none of this is helping her get any better.  She also notes that her nasal congestion has changed from a clear drainage to a green drainage.  She complains of nasal congestion, cough and 1 day of diarrhea.  She states her oxygen levels at home checked with a home O2 stat have been around 92%, but have dropped as low as 89%.          Past Medical History:  Diagnosis Date  . Diabetes mellitus without complication (HCC)   . Fatty liver   . Headache    sinus  . Hyperlipidemia   . Hypertension   . Multilevel degenerative disc disease    "spurs" on spine also  . Vertigo    mild - daily - positional  . Wears dentures    partial lower    Patient Active Problem List   Diagnosis Date Noted  . Carotid artery stenosis, asymptomatic, left 09/18/2019  . 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 Cough 08/20/2016  . Complex renal cyst 08/27/2015  . Personal history of colonic polyps   . Nausea   . Gastritis   . Fatty liver 12/12/2014  . Back pain, chronic 11/05/2014  . Diabetes mellitus without complication (HCC) 11/05/2014  . Arthritis of hand, degenerative 11/05/2014  . Bursitis, trochanteric  11/05/2014  . Smoking greater than 30 pack years 10/29/2008  . Avitaminosis D 11/21/2007  . HLD (hyperlipidemia) 02/15/2006  . Menopausal symptom 02/15/2006  . Clinical depression 11/23/2002  . Essential (primary) hypertension 03/22/1998  . Episodic paroxysmal anxiety disorder 03/22/1998    Past Surgical History:  Procedure Laterality Date  . ABDOMINAL HYSTERECTOMY    . CESAREAN SECTION    . COLONOSCOPY WITH PROPOFOL N/A 03/20/2015   Procedure: COLONOSCOPY WITH PROPOFOL;  Surgeon: Midge Minium, MD;  Location: [facility];  Service: Endoscopy;  Laterality: N/A;  . DILATION AND CURETTAGE OF UTERUS    . ESOPHAGOGASTRODUODENOSCOPY (EGD) WITH PROPOFOL N/A 03/20/2015   Procedure: ESOPHAGOGASTRODUODENOSCOPY (EGD) WITH PROPOFOL;  Surgeon: Midge Minium, MD;  Location: [facility];  Service: Endoscopy;  Laterality: N/A;  Diabetic - oral meds  . HEMORRHOID SURGERY    . LAPAROSCOPIC CHOLECYSTECTOMY    . LAPAROSCOPIC SUPRACERVICAL HYSTERECTOMY  2008  . SALPINGOOPHORECTOMY Left 2008  . TONSILLECTOMY AND ADENOIDECTOMY  1960    Prior to Admission medications   Medication Sig Start Date End Date Taking? Authorizing Provider  albuterol (VENTOLIN HFA) 108 (90 Base) MCG/ACT inhaler INHALE 2 PUFFS BY MOUTH EVERY 6 HOURS AS NEEDED FOR WHEEZING OR SHORTNESS OF BREATH 12/13/19   Malva Limes, MD  benazepril (LOTENSIN) 5 MG tablet Take 1 tablet (5 mg total) by mouth daily. 01/08/19  Malva LimesFisher, Donald E, MD  benzonatate (TESSALON PERLES) 100 MG capsule Take 1 capsule (100 mg total) by mouth 3 (three) times daily as needed for cough. 12/23/19 12/22/20  Lucy Chrisodgers, Zaidan Keeble J, PA  cyclobenzaprine (FLEXERIL) 5 MG tablet Take 1-2 tablets (5-10 mg total) by mouth 3 (three) times daily as needed (shoulder pain). 01/08/19   Malva LimesFisher, Donald E, MD  dapagliflozin propanediol (FARXIGA) 10 MG TABS tablet Take 10 mg by mouth daily before breakfast. 01/08/19   Malva LimesFisher, Donald E, MD  doxycycline (ADOXA) 100 MG tablet  Take 1 tablet (100 mg total) by mouth 2 (two) times daily for 7 days. 12/23/19 12/30/19  Lucy Chrisodgers, Carlia Bomkamp J, PA  gabapentin (NEURONTIN) 300 MG capsule TAKE 1 CAPSULE BY MOUTH EVERYDAY AT BEDTIME 08/06/19   Malva LimesFisher, Donald E, MD  JANUVIA 100 MG tablet TAKE 1 TABLET BY MOUTH EVERY DAY Patient taking differently: Take 50 mg by mouth daily.  01/26/19   Malva LimesFisher, Donald E, MD  lovastatin (MEVACOR) 20 MG tablet TAKE 1 TABLET BY MOUTH EVERY DAY 08/04/19   Malva LimesFisher, Donald E, MD  meloxicam (MOBIC) 7.5 MG tablet Take 1-2 tablets (7.5-15 mg total) by mouth daily as needed for pain. 05/20/17   Malva LimesFisher, Donald E, MD  metFORMIN (GLUCOPHAGE-XR) 500 MG 24 hr tablet TAKE 2 TABLETS BY MOUTH TWICE A DAY 11/28/19   Malva LimesFisher, Donald E, MD  PARoxetine (PAXIL) 40 MG tablet TAKE 1 TABLET BY MOUTH EVERY DAY 09/12/19   Malva LimesFisher, Donald E, MD  predniSONE (DELTASONE) 10 MG tablet Take 6 tablets (60 mg total) by mouth daily for 1 day, THEN 5 tablets (50 mg total) daily for 1 day, THEN 4 tablets (40 mg total) daily for 1 day, THEN 3 tablets (30 mg total) daily for 1 day, THEN 2 tablets (20 mg total) daily for 1 day, THEN 1 tablet (10 mg total) daily for 1 day. 12/23/19 12/29/19  Lucy Chrisodgers, Courtnay Petrilla J, PA  Spacer/Aero-Holding Chambers DEVI 1 Device by Does not apply route once for 1 dose. 12/23/19 12/23/19  Lucy Chrisodgers, Demetrie Borge J, PA  traZODone (DESYREL) 100 MG tablet TAKE 1/2 TO 1 TABLET BY MOUTH AT BEDTIME 10/04/19   Malva LimesFisher, Donald E, MD    Allergies Hydrocodone, Penicillins, Amoxicillin, Hydrocodone bitartrate er, and Wellbutrin [bupropion]  Family History  Problem Relation Age of Onset  . Diabetes Mother   . Hypertension Mother   . Diabetes Father   . Hypertension Father   . Hypertension Brother   . Diabetes Paternal Grandmother   . Emphysema Paternal Grandfather   . Hypertension Brother     Social History Social History   Tobacco Use  . Smoking status: Current Some Day Smoker    Packs/day: 0.25    Years: 42.00    Pack years: 10.50     Types: Cigarettes  . Smokeless tobacco: Never Used  . Tobacco comment: has a cigarette every few days  Vaping Use  . Vaping Use: Never used  Substance Use Topics  . Alcohol use: Yes    Alcohol/week: 0.0 standard drinks    Comment: OCCASIONALLY  . Drug use: No    Review of Systems Constitutional: No fever/chills Eyes: No visual changes. ENT: + Nasal congestion, no sore throat. Cardiovascular: Denies chest pain. Respiratory: + Cough, denies shortness of breath. Gastrointestinal: No abdominal pain.  No nausea, no vomiting.  + diarrhea.  No constipation. Genitourinary: Negative for dysuria. Musculoskeletal: Negative for back pain. Skin: Negative for rash. Neurological: Negative for headaches, focal weakness or numbness.  ____________________________________________  PHYSICAL EXAM:  VITAL SIGNS: ED Triage Vitals  Enc Vitals Group     BP 12/23/19 2124 (!) 149/64     Pulse Rate 12/23/19 2124 84     Resp 12/23/19 2124 18     Temp 12/23/19 2124 98.2 F (36.8 C)     Temp Source 12/23/19 2124 Oral     SpO2 12/23/19 2124 94 %     Weight 12/23/19 2126 107 lb 6.4 oz (48.7 kg)     Height 12/23/19 2126 5' (1.524 m)     Head Circumference --      Peak Flow --      Pain Score 12/23/19 2125 7     Pain Loc --      Pain Edu? --      Excl. in GC? --     Constitutional: Alert and oriented. Well appearing and in no acute distress. Eyes: Conjunctivae are normal. PERRL. EOMI. Head: + congestion/rhinnorhea. Mouth/Throat: Mucous membranes are moist.  Oropharynx non-erythematous. Neck: No stridor.   Cardiovascular: Normal rate, regular rhythm. Grossly normal heart sounds.  Good peripheral circulation. Respiratory: Normal respiratory effort.  No retractions. Lungs with expiratory wheezing heard throughout all lung fields.  No rhonchi or rales appreciated. Gastrointestinal: Soft and nontender. No distention. No abdominal bruits. No CVA tenderness. Musculoskeletal: No lower extremity  tenderness nor edema.  No joint effusions. Neurologic:  Normal speech and language. No gross focal neurologic deficits are appreciated. No gait instability. Skin:  Skin is warm, dry and intact. No rash noted. Psychiatric: Mood and affect are normal. Speech and behavior are normal.  ____________________________________________   LABS (all labs ordered are listed, but only abnormal results are displayed)  Labs Reviewed  RESPIRATORY PANEL BY RT PCR (FLU A&B, COVID)  GROUP A STREP BY PCR    ____________________________________________  RADIOLOGY Susa Raring, personally viewed and evaluated these images (plain radiographs) as part of my medical decision making, as well as reviewing the written report by the radiologist.  Official radiology report(s): DG Chest 1 View  Result Date: 12/23/2019 CLINICAL DATA:  Congestion. EXAM: CHEST  1 VIEW COMPARISON:  10/29/2015 FINDINGS: Heart size is unremarkable. Aortic calcifications are noted. There is no focal infiltrate. No large pleural effusion. No pneumothorax. No acute osseous abnormality. IMPRESSION: No active disease. Electronically Signed   By: Katherine Mantle M.D.   On: 12/23/2019 22:02    ____________________________________________   INITIAL IMPRESSION / ASSESSMENT AND PLAN / ED COURSE  As part of my medical decision making, I reviewed the following data within the electronic MEDICAL RECORD NUMBER Nursing notes reviewed and incorporated        Dana Weber is a 59 year old female who presents to the emergency department with 6 days of general malaise, nasal congestion, cough.  She has tested negative for Covid earlier in the course of her illness.  At this point in time, she has failed to respond to a 4-day course of prednisone, Bromfed, albuterol inhaler.  She has also complained of some drops in her SPO2 at home, as low as 89% but most often around 92%.  Given her failure to improve at this point, feel that is reasonable to  retest her for Covid and also test her for flu.  We will also test her for strep.  These all returned negative.  A chest x-ray was also performed which did not show any obvious active pneumonia.  At this time, differentials remain atypical pneumonia, acute sinusitis, viral URI, other viral syndrome.  On physical exam today, vitals are noted to be with some mild hypertension but SPO2 is 94%.  On physical exam, there is noted to be expiratory wheezing throughout all lung fields.  While the patient states she is using her albuterol inhaler, she does not have a spacer for use, will place a prescription for 1.  Also recommended a prednisone with a taper.  Given the change in the color of her nasal drainage as well as her failure to improve at this point, we will place her on doxycycline for coverage of any acute sinusitis versus atypical pneumonia.  Lastly, we will treat her cough symptoms with Tessalon Perles.  The patient is amenable with this plan and understands that she is to return to the emergency department should you experience any shortness of breath or worsening of symptoms.      ____________________________________________   FINAL CLINICAL IMPRESSION(S) / ED DIAGNOSES  Final diagnoses:  Suspected COVID-19 virus infection  Cough  Nasal congestion     ED Discharge Orders         Ordered    doxycycline (ADOXA) 100 MG tablet  2 times daily        12/23/19 2242    predniSONE (DELTASONE) 10 MG tablet        12/23/19 2242    Spacer/Aero-Holding Chambers DEVI   Once        12/23/19 2242    benzonatate (TESSALON PERLES) 100 MG capsule  3 times daily PRN        12/23/19 2242          *Please note:  Dana Weber was evaluated in Emergency Department on 12/23/2019 for the symptoms described in the history of present illness. She was evaluated in the context of the global COVID-19 pandemic, which necessitated consideration that the patient might be at risk for infection with the  SARS-CoV-2 virus that causes COVID-19. Institutional protocols and algorithms that pertain to the evaluation of patients at risk for COVID-19 are in a state of rapid change based on information released by regulatory bodies including the CDC and federal and state organizations. These policies and algorithms were followed during the patient's care in the ED.  Some ED evaluations and interventions may be delayed as a result of limited staffing during and the pandemic.*   Note:  This document was prepared using Dragon voice recognition software and may include unintentional dictation errors.    Lucy Chris, PA 12/23/19 2351    Arnaldo Natal, MD 12/24/19 Rich Fuchs

## 2019-12-31 ENCOUNTER — Telehealth: Payer: Self-pay

## 2019-12-31 DIAGNOSIS — R0981 Nasal congestion: Secondary | ICD-10-CM

## 2019-12-31 MED ORDER — FLUTICASONE PROPIONATE 50 MCG/ACT NA SUSP: 2.0000 | Freq: Every day | NASAL | 6 refills | Status: DC

## 2019-12-31 MED ORDER — CEFDINIR 300 MG PO CAPS: 300.0000 mg | ORAL_CAPSULE | Freq: Two times a day (BID) | ORAL | 0 refills | Status: AC

## 2019-12-31 NOTE — Telephone Encounter (Signed)
Have sent prescription for sinus antibiotic and nasal spray. Schedule virtual visit if not cleared up in 7-10 days.

## 2019-12-31 NOTE — Telephone Encounter (Signed)
Copied from CRM [id number]. Topic: Appointment Scheduling - Scheduling Inquiry for Clinic >> Dec 31, 2019  3:51 PM [place], [place] wrote: Reason for CRM: Pt stated she has been tested for Covid twice in the last 2 weeks and they both were negative. Pt stated she needs Dr. Sherrie Mustache to call her back because she still is experiencing head and chest congestion and would like an appt to be seen. Pt stated she has also been tested for strep. Pt requests call back

## 2020-01-01 ENCOUNTER — Other Ambulatory Visit: Payer: Self-pay | Admitting: Family Medicine

## 2020-01-01 DIAGNOSIS — I10 Essential (primary) hypertension: Secondary | ICD-10-CM

## 2020-01-01 NOTE — Telephone Encounter (Signed)
Requested medication (s) are due for refill today: no  Requested medication (s) are on the active medication list: yes  Last refill:  09/12/19 #90 2 RF  Future visit scheduled: yes  Notes to clinic:  not on active med list-    Requested Prescriptions  Pending Prescriptions Disp Refills   PARoxetine (PAXIL) 20 MG tablet [Pharmacy Med Name: PAROXETINE HCL 20 MG TABLET] 60 tablet     Sig: TAKE 2 TABLETS BY MOUTH EVERY DAY      Psychiatry:  Antidepressants - SSRI Failed - 01/01/2020  3:21 PM      Failed - Completed PHQ-2 or PHQ-9 in the last 360 days.      Passed - Valid encounter within last 6 months    Recent Outpatient Visits           3 months ago Diabetes mellitus without complication [facility])   [facility] Malva Limes, MD   5 months ago Acute reaction to situational stress   [facility] Chrismon, Jodell C[place], [place]   7 months ago Diabetes mellitus without complication [facility])   [place] Malva Limes, [place]   11 months ago Diabetes mellitus without complication [facility])   [facility] Malva Limes, MD   1 year ago Cervical cancer screening   [facility] Trey Sailors, PA-C       Future Appointments             In 1 week Sherrie Mustache, Demetrios Isaacs, MD [facility]

## 2020-01-01 NOTE — Telephone Encounter (Signed)
Patient advised.

## 2020-01-01 NOTE — Telephone Encounter (Signed)
Requested medications are due for refill today?  Pharmacy is requesting changes to the RX refill from today 01/01/2020.  Insurance limits to one tab daily.    Requested medications are on active medication list?  Yes as 20 mg tablets   Last Refill:   01/01/2020  # 60 with 5 refills   - This is the 20 mg tablet.    Future visit scheduled? Yes  Notes to Clinic:  Please see above and pharmacy's request for dosage change due to insurance limits.

## 2020-01-01 NOTE — Telephone Encounter (Signed)
Requested Prescriptions  Pending Prescriptions Disp Refills  . benazepril (LOTENSIN) 5 MG tablet [Pharmacy Med Name: BENAZEPRIL HCL 5 MG TABLET] 90 tablet 0    Sig: TAKE 1 TABLET BY MOUTH EVERY DAY     Cardiovascular:  ACE Inhibitors Failed - 01/01/2020  3:21 PM      Failed - Cr in normal range and within 180 days    Creatinine, Ser  Date Value Ref Range Status  05/18/2019 0.73 0.57 - 1.00 mg/dL Final   Creatinine, POC  Date Value Ref Range Status  03/13/2018 n/a mg/dL Final         Failed - K in normal range and within 180 days    Potassium  Date Value Ref Range Status  05/18/2019 4.6 3.5 - 5.2 mmol/L Final         Failed - Last BP in normal range    BP Readings from Last 1 Encounters:  12/23/19 (!) 149/64         Passed - Patient is not pregnant      Passed - Valid encounter within last 6 months    Recent Outpatient Visits          3 months ago Diabetes mellitus without complication (HCC)   [facility] Malva Limes, MD   5 months ago Acute reaction to situational stress   [facility] [place] E, [place]   7 months ago Diabetes mellitus without complication [facility])   [facility] Malva Limes, MD   11 months ago Diabetes mellitus without complication [facility])   [facility] Malva Limes, MD   1 year ago Cervical cancer screening   [facility] Trey Sailors, [place]      Future Appointments            In 1 week Fisher, Demetrios Isaacs, MD [facility]

## 2020-01-11 ENCOUNTER — Telehealth (INDEPENDENT_AMBULATORY_CARE_PROVIDER_SITE_OTHER): Payer: BC Managed Care – PPO | Admitting: Family Medicine

## 2020-01-11 DIAGNOSIS — E119 Type 2 diabetes mellitus without complications: Secondary | ICD-10-CM

## 2020-01-11 DIAGNOSIS — J40 Bronchitis, not specified as acute or chronic: Secondary | ICD-10-CM | POA: Diagnosis not present

## 2020-01-11 MED ORDER — LEVOFLOXACIN 750 MG PO TABS: 750.0000 mg | ORAL_TABLET | Freq: Every day | ORAL | 0 refills | Status: DC

## 2020-01-11 MED ORDER — PSEUDOEPH-BROMPHEN-DM 30-2-10 MG/5ML PO SYRP: 5.0000 mL | ORAL_SOLUTION | Freq: Four times a day (QID) | ORAL | 0 refills | Status: DC | PRN

## 2020-01-11 MED ORDER — DAPAGLIFLOZIN PROPANEDIOL 10 MG PO TABS: 10.0000 mg | ORAL_TABLET | Freq: Every day | ORAL | 3 refills | Status: DC

## 2020-01-11 NOTE — Progress Notes (Signed)
MyChart Video Visit    Virtual Visit via Video Note   This visit type was conducted due to national recommendations for restrictions regarding the COVID-19 Pandemic (e.g. social distancing) in an effort to limit this patient's exposure and mitigate transmission in our community. This patient is at least at moderate risk for complications without adequate follow up. This format is felt to be most appropriate for this patient at this time. Physical exam was limited by quality of the video and audio technology used for the visit.   Patient location: home Provider location: [facility]  I discussed the limitations of evaluation and management by telemedicine and the availability of in person appointments. The patient expressed understanding and agreed to proceed.  Patient: Dana Weber   DOB: 12-21-1960   59 y.o. Female  MRN: [id number] Visit Date: 01/11/2020  Today's healthcare provider: Mila Merry, MD   Chief Complaint  Patient presents with  . Cough   Subjective       This is a new problem. The current episode started 1 month ago. The problem has been gradually improving. The cough is productive of sputum (green colored). Associated symptoms include ear congestion, ear pain, headaches, myalgias, nasal congestion, rhinorrhea, a sore throat, shortness of breath and wheezing. Pertinent negatives include no chest pain, chills, fever or hemoptysis. Associated symptoms comments: Chest congestion. Treatments tried: Cefdinir and flonase nasal spray, Mucinex, Robitussin, OTC Cold medication and prednisone. The treatment provided no relief.   Oxygen levels were in the upper 80s the first few week, but normal. Is on last dose of 10 day course of cefdinir    Medications: Outpatient Medications Prior to Visit  Medication Sig  . albuterol (VENTOLIN HFA) 108 (90 Base) MCG/ACT inhaler INHALE 2 PUFFS BY MOUTH EVERY 6 HOURS AS NEEDED FOR WHEEZING OR SHORTNESS OF BREATH  . benazepril (LOTENSIN) 5  MG tablet TAKE 1 TABLET BY MOUTH EVERY DAY  . cyclobenzaprine (FLEXERIL) 5 MG tablet Take 1-2 tablets (5-10 mg total) by mouth 3 (three) times daily as needed (shoulder pain).  . dapagliflozin propanediol (FARXIGA) 10 MG TABS tablet Take 10 mg by mouth daily before breakfast.  . fluticasone (FLONASE) 50 MCG/ACT nasal spray Place 2 sprays into both nostrils daily.  Marland Kitchen gabapentin (NEURONTIN) 300 MG capsule TAKE 1 CAPSULE BY MOUTH EVERYDAY AT BEDTIME  . JANUVIA 100 MG tablet TAKE 1 TABLET BY MOUTH EVERY DAY (Patient taking differently: Take 50 mg by mouth daily. )  . lovastatin (MEVACOR) 20 MG tablet TAKE 1 TABLET BY MOUTH EVERY DAY  . meloxicam (MOBIC) 7.5 MG tablet Take 1-2 tablets (7.5-15 mg total) by mouth daily as needed for pain.  . metFORMIN (GLUCOPHAGE-XR) 500 MG 24 hr tablet TAKE 2 TABLETS BY MOUTH TWICE A DAY  . PARoxetine (PAXIL) 40 MG tablet TAKE 1 TABLET BY MOUTH EVERY DAY  . PARoxetine (PAXIL) 40 MG tablet 1 TAB DAILY  . traZODone (DESYREL) 100 MG tablet TAKE 1/2 TO 1 TABLET BY MOUTH AT BEDTIME  . benzonatate (TESSALON PERLES) 100 MG capsule Take 1 capsule (100 mg total) by mouth 3 (three) times daily as needed for cough. (Patient not taking: Reported on 01/11/2020)   No facility-administered medications prior to visit.    Review of Systems  Constitutional: Positive for fatigue. Negative for appetite change, chills and fever.  HENT: Positive for ear pain, rhinorrhea and sore throat.   Respiratory: Positive for cough, shortness of breath and wheezing. Negative for hemoptysis and chest tightness.   Cardiovascular:  Negative for chest pain and palpitations.  Gastrointestinal: Negative for abdominal pain, nausea and vomiting.  Musculoskeletal: Positive for myalgias.  Neurological: Positive for headaches. Negative for dizziness and weakness.      Objective      Physical Exam   Awake, alert, oriented x 3. In no apparent distress    Assessment & Plan     1. Bronchitis Failed  cefdinir and doxycycline. Has done well with levofloxacin in the past.  - levofloxacin (LEVAQUIN) 750 MG tablet; Take 1 tablet (750 mg total) by mouth daily for 7 days.  Dispense: 7 tablet; Refill: 0 - brompheniramine-pseudoephedrine-DM 30-2-10 MG/5ML syrup; Take 5 mLs by mouth 4 (four) times daily as needed (for cough).  Dispense: 120 mL; Refill: 0  Call if symptoms change or if not rapidly improving.   2. Diabetes mellitus without complication (HCC) Needs refill- dapagliflozin propanediol (FARXIGA) 10 MG TABS tablet; Take 1 tablet (10 mg total) by mouth daily before breakfast.  Dispense: 90 tablet; Refill: 3   No follow-ups on file.     I discussed the assessment and treatment plan with the patient. The patient was provided an opportunity to ask questions and all were answered. The patient agreed with the plan and demonstrated an understanding of the instructions.   The patient was advised to call back or seek an in-person evaluation if the symptoms worsen or if the condition fails to improve as anticipated.  I provided 14 minutes of non-face-to-face time during this encounter.  The entirety of the information documented in the History of Present Illness, Review of Systems and Physical Exam were personally obtained by me. Portions of this information were initially documented by the CMA and reviewed by me for thoroughness and accuracy.     Mila Merry, MD [facility] [phone] (phone) [phone] (fax)  [facility]

## 2020-01-28 ENCOUNTER — Other Ambulatory Visit: Payer: Self-pay | Admitting: Family Medicine

## 2020-01-28 DIAGNOSIS — I10 Essential (primary) hypertension: Secondary | ICD-10-CM

## 2020-02-12 ENCOUNTER — Other Ambulatory Visit: Payer: Self-pay | Admitting: Family Medicine

## 2020-02-12 DIAGNOSIS — E119 Type 2 diabetes mellitus without complications: Secondary | ICD-10-CM

## 2020-02-12 NOTE — Telephone Encounter (Signed)
Requested medication (s) are due for refill today: Yes  Requested medication (s) are on the active medication list: Yes  Last refill:  01/26/19  Future visit scheduled: No  Notes to clinic:  Prescription has expired.    Requested Prescriptions  Pending Prescriptions Disp Refills   JANUVIA 100 MG tablet [Pharmacy Med Name: JANUVIA 100MG  TABLETS] 90 tablet 4    Sig: TAKE 1 TABLET BY MOUTH EVERY DAY      Endocrinology:  Diabetes - DPP-4 Inhibitors Passed - 02/12/2020  7:07 AM      Passed - HBA1C is between 0 and 7.9 and within 180 days    Hemoglobin A1C  Date Value Ref Range Status  09/11/2019 7.9 (A) 4.0 - 5.6 % Final  04/30/2016 7.5  Final          Passed - Cr in normal range and within 360 days    Creatinine, Ser  Date Value Ref Range Status  05/18/2019 0.73 0.57 - 1.00 mg/dL Final   Creatinine, POC  Date Value Ref Range Status  03/13/2018 n/a mg/dL Final          Passed - Valid encounter within last 6 months    Recent Outpatient Visits           1 month ago Bronchitis   [facility], MD   5 months ago Diabetes mellitus without complication [facility])   [facility], [place]   6 months ago Acute reaction to situational stress   [facility] Chrismon, [place], [place]   9 months ago Diabetes mellitus without complication [facility])   [facility], MD   1 year ago Diabetes mellitus without complication [facility])   [facility], MD

## 2020-04-02 ENCOUNTER — Other Ambulatory Visit: Payer: Self-pay | Admitting: Family Medicine

## 2020-04-02 DIAGNOSIS — I10 Essential (primary) hypertension: Secondary | ICD-10-CM

## 2020-04-02 DIAGNOSIS — G4700 Insomnia, unspecified: Secondary | ICD-10-CM

## 2020-04-07 ENCOUNTER — Ambulatory Visit (INDEPENDENT_AMBULATORY_CARE_PROVIDER_SITE_OTHER): Payer: BC Managed Care – PPO | Admitting: Vascular Surgery

## 2020-04-07 ENCOUNTER — Encounter (INDEPENDENT_AMBULATORY_CARE_PROVIDER_SITE_OTHER): Payer: BC Managed Care – PPO

## 2020-04-16 ENCOUNTER — Encounter: Payer: Self-pay | Admitting: Emergency Medicine

## 2020-04-16 ENCOUNTER — Other Ambulatory Visit: Payer: Self-pay

## 2020-04-16 ENCOUNTER — Emergency Department: Payer: BC Managed Care – PPO

## 2020-04-16 ENCOUNTER — Emergency Department
Admission: EM | Admit: 2020-04-16 | Discharge: 2020-04-17 | Disposition: A | Discharge status: Home / Self Care | Payer: BC Managed Care – PPO | Attending: Emergency Medicine | Admitting: Emergency Medicine

## 2020-04-16 ENCOUNTER — Ambulatory Visit: Payer: Self-pay

## 2020-04-16 DIAGNOSIS — R519 Headache, unspecified: Secondary | ICD-10-CM | POA: Diagnosis present

## 2020-04-16 DIAGNOSIS — F17210 Nicotine dependence, cigarettes, uncomplicated: Secondary | ICD-10-CM | POA: Insufficient documentation

## 2020-04-16 DIAGNOSIS — U071 COVID-19: Secondary | ICD-10-CM | POA: Diagnosis not present

## 2020-04-16 DIAGNOSIS — Z86010 Personal history of colonic polyps: Secondary | ICD-10-CM | POA: Diagnosis not present

## 2020-04-16 DIAGNOSIS — I10 Essential (primary) hypertension: Secondary | ICD-10-CM | POA: Diagnosis not present

## 2020-04-16 DIAGNOSIS — E785 Hyperlipidemia, unspecified: Secondary | ICD-10-CM | POA: Diagnosis not present

## 2020-04-16 DIAGNOSIS — B349 Viral infection, unspecified: Secondary | ICD-10-CM | POA: Insufficient documentation

## 2020-04-16 DIAGNOSIS — R030 Elevated blood-pressure reading, without diagnosis of hypertension: Secondary | ICD-10-CM

## 2020-04-16 DIAGNOSIS — E1169 Type 2 diabetes mellitus with other specified complication: Secondary | ICD-10-CM | POA: Diagnosis not present

## 2020-04-16 LAB — CBC
HCT: 42 % (ref 36.0–46.0)
Hemoglobin: 14.4 g/dL (ref 12.0–15.0)
MCH: 31 pg (ref 26.0–34.0)
MCHC: 34.3 g/dL (ref 30.0–36.0)
MCV: 90.5 fL (ref 80.0–100.0)
Platelets: 287 10*3/uL (ref 150–400)
RBC: 4.64 MIL/uL (ref 3.87–5.11)
RDW: 12 % (ref 11.5–15.5)
WBC: 8.1 10*3/uL (ref 4.0–10.5)
nRBC: 0 % (ref 0.0–0.2)

## 2020-04-16 LAB — BASIC METABOLIC PANEL
Anion gap: 12 (ref 5–15)
BUN: 17 mg/dL (ref 6–20)
CO2: 26 mmol/L (ref 22–32)
Calcium: 8.9 mg/dL (ref 8.9–10.3)
Chloride: 98 mmol/L (ref 98–111)
Creatinine, Ser: 0.53 mg/dL (ref 0.44–1.00)
GFR, Estimated: >60 mL/min (ref >60)
Glucose, Bld: 207 mg/dL — ABNORMAL HIGH (ref 70–99)
Potassium: 3.9 mmol/L (ref 3.5–5.1)
Sodium: 136 mmol/L (ref 135–145)

## 2020-04-16 LAB — GROUP A STREP BY PCR: Group A Strep by PCR: NOT DETECTED

## 2020-04-16 MED ORDER — KETOROLAC TROMETHAMINE 30 MG/ML IJ SOLN
30.0000 mg | Freq: Once | INTRAMUSCULAR | Status: AC
  Administered 2020-04-16: 30 mg via INTRAVENOUS
  Filled 2020-04-16: qty 1

## 2020-04-16 MED ORDER — DIPHENHYDRAMINE HCL 50 MG/ML IJ SOLN: 25.0000 mg | Freq: Once | INTRAMUSCULAR | Status: DC

## 2020-04-16 MED ORDER — CLONIDINE HCL 0.1 MG PO TABS
0.1000 mg | ORAL_TABLET | Freq: Once | ORAL | Status: AC
  Administered 2020-04-16: 0.1 mg via ORAL
  Filled 2020-04-16: qty 1

## 2020-04-16 MED ORDER — BENAZEPRIL HCL 5 MG PO TABS: 5.0000 mg | ORAL_TABLET | Freq: Every day | ORAL | Status: DC

## 2020-04-16 MED ORDER — BENAZEPRIL HCL 5 MG PO TABS: 5.0000 mg | ORAL_TABLET | Freq: Every day | ORAL | 0 refills | Status: DC

## 2020-04-16 MED ORDER — ONDANSETRON 4 MG PO TBDP
4.0000 mg | ORAL_TABLET | Freq: Once | ORAL | Status: AC
  Administered 2020-04-16: 4 mg via ORAL
  Filled 2020-04-16: qty 1

## 2020-04-16 MED ORDER — ACETAMINOPHEN 500 MG PO TABS
1000.0000 mg | ORAL_TABLET | Freq: Once | ORAL | Status: AC
  Administered 2020-04-16: 1000 mg via ORAL
  Filled 2020-04-16: qty 2

## 2020-04-16 NOTE — Telephone Encounter (Signed)
Patient called stating that her BP has been high today.  She says she has had a headache since Sunday.  Today BP at 4 pm 187/92 and 167/ 82.  She takes BP medications and has not missed any doses. She has no blurred vision or weakness. She states that she also has nasal congestion and sore throat with mild cough. She is hot and cold.  She states that her husband was COVID+ on 03/31/20.  She was tested the next day. She tested  negative. She has had 2 pfizer vaccines no booster. Per protocol patient will go to ER tonight for evaluation of all symptoms. Care advice was reviewed with patient She verbalized understanding and will follow the plan of care.  Reason for Disposition . [1] COVID-19 infection suspected by caller or triager AND [2] mild symptoms (cough, fever, or others) AND [3] negative COVID-19 rapid test . [1] Systolic BP  >= 160 OR Diastolic >= 100 AND [2] cardiac or neurologic symptoms (e.g., chest pain, difficulty breathing, unsteady gait, blurred vision)  Answer Assessment - Initial Assessment Questions 1. COVID-19 DIAGNOSIS: "Who made your COVID-19 diagnosis?" "Was it confirmed by a positive lab test?" If not diagnosed by a HCP, ask "Are there lots of cases (community spread) where you live?" Note: See public health department website, if unsure.     yes 2. COVID-19 EXPOSURE: "Was there any known exposure to COVID before the symptoms began?" CDC Definition of close contact: within 6 feet (2 meters) for a total of 15 minutes or more over a 24-hour period.    Husband 1/10 22 positive 3. ONSET: "When did the COVID-19 symptoms start?"    Sunday with headache sore throat 4. WORST SYMPTOM: "What is your worst symptom?" (e.g., cough, fever, shortness of breath, muscle aches)   Headache sore throat 5. COUGH: "Do you have a cough?" If Yes, ask: "How bad is the cough?"      mild 6. FEVER: "Do you have a fever?" If Yes, ask: "What is your temperature, how was it measured, and when did it start?"   Unsure 7. RESPIRATORY STATUS: "Describe your breathing?" (e.g., shortness of breath, wheezing, unable to speak)     no 8. BETTER-SAME-WORSE: "Are you getting better, staying the same or getting worse compared to yesterday?"  If getting worse, ask, "In what way?"    Headache worse 9. HIGH RISK DISEASE: "Do you have any chronic medical problems?" (e.g., asthma, heart or lung disease, weak immune system, obesity, etc.)     no 10. VACCINE: "Have you gotten the COVID-19 vaccine?" If Yes ask: "Which one, how many shots, when did you get it?"      Yes pfizer 11. PREGNANCY: "Is there any chance you are pregnant?" "When was your last menstrual period?"       N/A 12. OTHER SYMPTOMS: "Do you have any other symptoms?"  (e.g., chills, fatigue, headache, loss of smell or taste, muscle pain, sore throat; new loss of smell or taste especially support the diagnosis of COVID-19)     Nose is stuffy, hot and cold  Answer Assessment - Initial Assessment Questions 1. BLOOD PRESSURE: "What is the blood pressure?" "Did you take at least two measurements 5 minutes apart?"     18 7/92 167/82 2. ONSET: "When did you take your blood pressure?"    About 4 today 3. HOW: "How did you obtain the blood pressure?" (e.g., visiting nurse, automatic home BP monitor)    Home bp cuff 4. HISTORY: "Do you have  a history of high blood pressure?"    yes 5. MEDICATIONS: "Are you taking any medications for blood pressure?" "Have you missed any doses recently?"    yes 6. OTHER SYMPTOMS: "Do you have any symptoms?" (e.g., headache, chest pain, blurred vision, difficulty breathing, weakness)    headache 7. PREGNANCY: "Is there any chance you are pregnant?" "When was your last menstrual period?"     N/A  Protocols used: CORONAVIRUS (COVID-19) DIAGNOSED OR SUSPECTED-A-AH, BLOOD PRESSURE - HIGH-A-AH

## 2020-04-16 NOTE — ED Triage Notes (Signed)
Pt reports HA that started on Sunday and a sore throat that started a few days before. Pt reports called her MD today and they advised her to come straight to the ED. Pt denies blurred vision or all other sx's.

## 2020-04-16 NOTE — ED Triage Notes (Signed)
Pt reports her husband had covid on 03/31/20 and she tested on 1/11 but it was negative. Pt reports hx of HA but states that the ache is different with this one

## 2020-04-17 ENCOUNTER — Other Ambulatory Visit: Payer: Self-pay | Admitting: Family Medicine

## 2020-04-17 DIAGNOSIS — I10 Essential (primary) hypertension: Secondary | ICD-10-CM

## 2020-04-17 LAB — SARS CORONAVIRUS 2 (TAT 6-24 HRS): SARS Coronavirus 2: POSITIVE — AB

## 2020-04-17 NOTE — ED Provider Notes (Signed)
[facility] Emergency Department Provider Note  ____________________________________________  Time seen: Approximately 12:04 AM  I have reviewed the triage vital signs and the nursing notes.   HISTORY  Chief Complaint Headache and Sore Throat    HPI Dana Weber is a 60 y.o. female that presents to the emergency department for evaluation of headache, sore throat, nasal congestion, occasional cough for 3 days.  Patient also states that her lymph nodes in her neck feel swollen.  Patient also notes that her blood pressure has been higher than usual.  Patient has a history of hypertension and takes benazepril/hydrochlorothiazide.  Her primary care provider decreased her dosage a couple of months ago because her blood pressure had improved.  She checked her blood pressure 3 days ago because she had a headache.  Her husband tested positive for Covid 2 weeks ago.  Patient is Covid vaccinated.  She is unsure of fever.  No shortness of breath, chest pain, vomiting, diarrhea.   Past Medical History:  Diagnosis Date  . Diabetes mellitus without complication (HCC)   . Fatty liver   . Headache    sinus  . Hyperlipidemia   . Hypertension   . Multilevel degenerative disc disease    "spurs" on spine also  . Vertigo    mild - daily - positional  . Wears dentures    partial lower    Patient Active Problem List   Diagnosis Date Noted  . Carotid artery stenosis, asymptomatic, left 09/18/2019  . 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 Cough 08/20/2016  . Complex renal cyst 08/27/2015  . Personal history of colonic polyps   . Nausea   . Gastritis   . Fatty liver 12/12/2014  . Back pain, chronic 11/05/2014  . Diabetes mellitus without complication (HCC) 11/05/2014  . Arthritis of hand, degenerative 11/05/2014  . Bursitis, trochanteric 11/05/2014  . Smoking greater than 30 pack years 10/29/2008  . Avitaminosis D 11/21/2007  . HLD (hyperlipidemia) 02/15/2006  . Menopausal  symptom 02/15/2006  . Clinical depression 11/23/2002  . Essential (primary) hypertension 03/22/1998  . Episodic paroxysmal anxiety disorder 03/22/1998    Past Surgical History:  Procedure Laterality Date  . ABDOMINAL HYSTERECTOMY    . CESAREAN SECTION    . COLONOSCOPY WITH PROPOFOL N/A 03/20/2015   Procedure: COLONOSCOPY WITH PROPOFOL;  Surgeon: Midge Minium, MD;  Location: [facility];  Service: Endoscopy;  Laterality: N/A;  . DILATION AND CURETTAGE OF UTERUS    . ESOPHAGOGASTRODUODENOSCOPY (EGD) WITH PROPOFOL N/A 03/20/2015   Procedure: ESOPHAGOGASTRODUODENOSCOPY (EGD) WITH PROPOFOL;  Surgeon: Midge Minium, MD;  Location: [facility];  Service: Endoscopy;  Laterality: N/A;  Diabetic - oral meds  . HEMORRHOID SURGERY    . LAPAROSCOPIC CHOLECYSTECTOMY    . LAPAROSCOPIC SUPRACERVICAL HYSTERECTOMY  2008  . SALPINGOOPHORECTOMY Left 2008  . TONSILLECTOMY AND ADENOIDECTOMY  1960    Prior to Admission medications   Medication Sig Start Date End Date Taking? Authorizing Provider  benazepril (LOTENSIN) 5 MG tablet Take 1 tablet (5 mg total) by mouth daily. 04/16/20  Yes Enid Derry, PA-C  albuterol (VENTOLIN HFA) 108 (90 Base) MCG/ACT inhaler INHALE 2 PUFFS BY MOUTH EVERY 6 HOURS AS NEEDED FOR WHEEZING OR SHORTNESS OF BREATH 12/13/19   Malva Limes, MD  benazepril (LOTENSIN) 5 MG tablet TAKE 1 TABLET BY MOUTH EVERY DAY 04/03/20   Malva Limes, MD  benzonatate (TESSALON PERLES) 100 MG capsule Take 1 capsule (100 mg total) by mouth 3 (three) times daily as  needed for cough. Patient not taking: Reported on 01/11/2020 12/23/19 12/22/20  Lucy Chris, PA  brompheniramine-pseudoephedrine-DM 30-2-10 MG/5ML syrup Take 5 mLs by mouth 4 (four) times daily as needed (for cough). 01/11/20   Malva Limes, MD  cyclobenzaprine (FLEXERIL) 5 MG tablet Take 1-2 tablets (5-10 mg total) by mouth 3 (three) times daily as needed (shoulder pain). 01/08/19   Malva Limes, MD   dapagliflozin propanediol (FARXIGA) 10 MG TABS tablet Take 1 tablet (10 mg total) by mouth daily before breakfast. 01/11/20   Malva Limes, MD  fluticasone (FLONASE) 50 MCG/ACT nasal spray Place 2 sprays into both nostrils daily. 12/31/19   Malva Limes, MD  gabapentin (NEURONTIN) 300 MG capsule TAKE 1 CAPSULE BY MOUTH EVERYDAY AT BEDTIME 08/06/19   Malva Limes, MD  lovastatin (MEVACOR) 20 MG tablet TAKE 1 TABLET BY MOUTH EVERY DAY 08/04/19   Malva Limes, MD  meloxicam (MOBIC) 7.5 MG tablet Take 1-2 tablets (7.5-15 mg total) by mouth daily as needed for pain. 05/20/17   Malva Limes, MD  metFORMIN (GLUCOPHAGE-XR) 500 MG 24 hr tablet TAKE 2 TABLETS BY MOUTH TWICE A DAY 04/03/20   Malva Limes, MD  PARoxetine (PAXIL) 40 MG tablet TAKE 1 TABLET BY MOUTH EVERY DAY 09/12/19   Malva Limes, MD  PARoxetine (PAXIL) 40 MG tablet 1 TAB DAILY 01/02/20   Malva Limes, MD  sitaGLIPtin (JANUVIA) 100 MG tablet Take 1 tablet (100 mg total) by mouth daily. 02/12/20   Malva Limes, MD  traZODone (DESYREL) 100 MG tablet TAKE 1/2-1 TABLET BY MOUTH AT BEDTIME 04/03/20   Malva Limes, MD    Allergies Hydrocodone, Penicillins, Amoxicillin, Hydrocodone bitartrate er, and Wellbutrin [bupropion]  Family History  Problem Relation Age of Onset  . Diabetes Mother   . Hypertension Mother   . Diabetes Father   . Hypertension Father   . Hypertension Brother   . Diabetes Paternal Grandmother   . Emphysema Paternal Grandfather   . Hypertension Brother     Social History Social History   Tobacco Use  . Smoking status: Current Some Day Smoker    Packs/day: 0.25    Years: 42.00    Pack years: 10.50    Types: Cigarettes  . Smokeless tobacco: Never Used  . Tobacco comment: has a cigarette every few days  Vaping Use  . Vaping Use: Never used  Substance Use Topics  . Alcohol use: Yes    Alcohol/week: 0.0 standard drinks    Comment: OCCASIONALLY  . Drug use: No     Review  of Systems  Constitutional: No chills Eyes: No visual changes. No discharge. ENT: Positive for congestion and rhinorrhea. Cardiovascular: No chest pain. Respiratory: Positive for cough. No SOB. Gastrointestinal: No abdominal pain.  No nausea, no vomiting.  No diarrhea.  No constipation. Musculoskeletal: Negative for musculoskeletal pain. Skin: Negative for rash, abrasions, lacerations, ecchymosis. Neurological: Positive for headache.   ____________________________________________   PHYSICAL EXAM:  VITAL SIGNS: ED Triage Vitals  Enc Vitals Group     BP 04/16/20 1846 (!) 191/91     Pulse Rate 04/16/20 1846 84     Resp 04/16/20 1846 20     Temp 04/16/20 1846 99.1 F (37.3 C)     Temp Source 04/16/20 1846 Oral     SpO2 04/16/20 1846 97 %     Weight 04/16/20 1842 111 lb (50.3 kg)     Height 04/16/20 1842 5' (1.524 m)  Head Circumference --      Peak Flow --      Pain Score 04/16/20 1842 6     Pain Loc --      Pain Edu? --      Excl. in GC? --      Constitutional: Alert and oriented. Well appearing and in no acute distress. Eyes: Conjunctivae are normal. PERRL. EOMI. No discharge. Head: Atraumatic. ENT: No frontal and maxillary sinus tenderness.      Ears: Tympanic membranes pearly gray with good landmarks. No discharge.      Nose: Mild congestion/rhinnorhea.      Mouth/Throat: Mucous membranes are moist. Oropharynx non-erythematous. Tonsils not enlarged. No exudates. Uvula midline. Neck: No stridor.  Anterior cervical lymphadenopathy.  As well Hematological/Lymphatic/Immunilogical: No cervical lymphadenopathy. Cardiovascular: Normal rate, regular rhythm.  Good peripheral circulation. Respiratory: Normal respiratory effort without tachypnea or retractions. Lungs CTAB. Good air entry to the bases with no decreased or absent breath sounds. Gastrointestinal: Bowel sounds 4 quadrants. Soft and nontender to palpation. No guarding or rigidity. No palpable masses. No  distention. Musculoskeletal: Full range of motion to all extremities. No gross deformities appreciated. Neurologic:  Normal speech and language. No gross focal neurologic deficits are appreciated.  Skin:  Skin is warm, dry and intact. No rash noted. Psychiatric: Mood and affect are normal. Speech and behavior are normal. Patient exhibits appropriate insight and judgement.   ____________________________________________   LABS (all labs ordered are listed, but only abnormal results are displayed)  Labs Reviewed  BASIC METABOLIC PANEL - Abnormal; Notable for the following components:      Result Value   Glucose, Bld 207 (*)    All other components within normal limits  GROUP A STREP BY PCR  SARS CORONAVIRUS 2 (TAT 6-24 HRS)  CBC  POC SARS CORONAVIRUS 2 AG -  ED   ____________________________________________  EKG   ____________________________________________  RADIOLOGY Lexine Baton, personally viewed and evaluated these images (plain radiographs) as part of my medical decision making, as well as reviewing the written report by the radiologist.  DG Chest 2 View  Result Date: 04/16/2020 CLINICAL DATA:  Cough EXAM: CHEST - 2 VIEW COMPARISON:  12/23/2019 FINDINGS: The heart size and mediastinal contours are within normal limits. Both lungs are clear. The visualized skeletal structures are unremarkable. IMPRESSION: Negative. Electronically Signed   By: Charlett Nose M.D.   On: 04/16/2020 21:36   CT Head Wo Contrast  Result Date: 04/16/2020 CLINICAL DATA:  Headache EXAM: CT HEAD WITHOUT CONTRAST TECHNIQUE: Contiguous axial images were obtained from the base of the skull through the vertex without intravenous contrast. COMPARISON:  None. FINDINGS: Brain: No acute intracranial abnormality. Specifically, no hemorrhage, hydrocephalus, mass lesion, acute infarction, or significant intracranial injury. Vascular: No hyperdense vessel or unexpected calcification. Skull: No acute calvarial  abnormality. Sinuses/Orbits: No acute findings Other: None IMPRESSION: Normal study. Electronically Signed   By: Charlett Nose M.D.   On: 04/16/2020 21:37    ____________________________________________    PROCEDURES  Procedure(s) performed:    Procedures    Medications  acetaminophen (TYLENOL) tablet 1,000 mg (1,000 mg Oral Given 04/16/20 2156)  ondansetron (ZOFRAN-ODT) disintegrating tablet 4 mg (4 mg Oral Given 04/16/20 2155)  ketorolac (TORADOL) 30 MG/ML injection 30 mg (30 mg Intravenous Given 04/16/20 2306)  cloNIDine (CATAPRES) tablet 0.1 mg (0.1 mg Oral Given 04/16/20 2305)     ____________________________________________   INITIAL IMPRESSION / ASSESSMENT AND PLAN / ED COURSE  Pertinent labs & imaging results that  were available during my care of the patient were reviewed by me and considered in my medical decision making (see chart for details).  Review of the [place] CSRS was performed in accordance of the [facility] prior to dispensing any controlled drugs.   Patient presented to the emergency department for evaluation of viral symptoms and elevated blood pressure. Vital signs and exam are reassuring.  Lab work is largely unremarkable.  Normal sinus rhythm on EKG.  Chest x-ray negative for acute cardiopulmonary processes.  Head CT negative for acute intercranial abnormality.  Patient was given Tylenol, Toradol for her headache.  She was given a dose of clonidine for her blood pressure.  Her headache has improved.  Her blood pressure has trended downward and is 166/92 on recheck.  Patient was recently decreased on her blood pressure medication will be given a short prescription for additional 5 mg of her benazepril.  She will take this additional 5 mg of the benazepril rather than her current prescription so that she does not also double up on her hydrochlorothiazide.  Her headache may be related to her blood pressure or may be related to her other viral symptoms.  Her Covid test is still  pending.   Patient feels comfortable going home. Patient will be discharged home with prescriptions for benazepril. Patient is to follow up with primary care as needed or otherwise directed. Patient is given ED precautions to return to the ED for any worsening or new symptoms.   Jaemarie Hochberg was evaluated in Emergency Department on 04/17/2020 for the symptoms described in the history of present illness. She was evaluated in the context of the global COVID-19 pandemic, which necessitated consideration that the patient might be at risk for infection with the SARS-CoV-2 virus that causes COVID-19. Institutional protocols and algorithms that pertain to the evaluation of patients at risk for COVID-19 are in a state of rapid change based on information released by regulatory bodies including the CDC and federal and state organizations. These policies and algorithms were followed during the patient's care in the ED.  ____________________________________________  FINAL CLINICAL IMPRESSION(S) / ED DIAGNOSES  Final diagnoses:  Viral illness  Acute nonintractable headache, unspecified headache type  Elevated blood pressure reading      NEW MEDICATIONS STARTED DURING THIS VISIT:  ED Discharge Orders         Ordered    benazepril (LOTENSIN) 5 MG tablet  Daily        04/16/20 2336              This chart was dictated using voice recognition software/Dragon. Despite best efforts to proofread, errors can occur which can change the meaning. Any change was purely unintentional.    Enid Derry, PA-C 04/17/20 Oliver Hum, MD 04/17/20 [phone]

## 2020-04-30 ENCOUNTER — Other Ambulatory Visit (INDEPENDENT_AMBULATORY_CARE_PROVIDER_SITE_OTHER): Payer: Self-pay | Admitting: Vascular Surgery

## 2020-04-30 DIAGNOSIS — I6523 Occlusion and stenosis of bilateral carotid arteries: Secondary | ICD-10-CM

## 2020-05-01 ENCOUNTER — Ambulatory Visit (INDEPENDENT_AMBULATORY_CARE_PROVIDER_SITE_OTHER): Payer: BC Managed Care – PPO | Admitting: Vascular Surgery

## 2020-05-01 ENCOUNTER — Ambulatory Visit (INDEPENDENT_AMBULATORY_CARE_PROVIDER_SITE_OTHER): Payer: BC Managed Care – PPO

## 2020-05-01 ENCOUNTER — Encounter (INDEPENDENT_AMBULATORY_CARE_PROVIDER_SITE_OTHER): Payer: Self-pay | Admitting: Vascular Surgery

## 2020-05-01 ENCOUNTER — Other Ambulatory Visit: Payer: Self-pay

## 2020-05-01 VITALS — BP 149/76 | HR 84 | Resp 16 | Wt 116.0 lb

## 2020-05-01 DIAGNOSIS — I10 Essential (primary) hypertension: Secondary | ICD-10-CM | POA: Diagnosis not present

## 2020-05-01 DIAGNOSIS — E785 Hyperlipidemia, unspecified: Secondary | ICD-10-CM

## 2020-05-01 DIAGNOSIS — E119 Type 2 diabetes mellitus without complications: Secondary | ICD-10-CM | POA: Diagnosis not present

## 2020-05-01 DIAGNOSIS — I6522 Occlusion and stenosis of left carotid artery: Secondary | ICD-10-CM | POA: Diagnosis not present

## 2020-05-01 DIAGNOSIS — I6523 Occlusion and stenosis of bilateral carotid arteries: Secondary | ICD-10-CM

## 2020-05-01 DIAGNOSIS — I739 Peripheral vascular disease, unspecified: Secondary | ICD-10-CM | POA: Diagnosis not present

## 2020-05-04 ENCOUNTER — Encounter (INDEPENDENT_AMBULATORY_CARE_PROVIDER_SITE_OTHER): Payer: Self-pay | Admitting: Vascular Surgery

## 2020-05-04 DIAGNOSIS — I739 Peripheral vascular disease, unspecified: Secondary | ICD-10-CM | POA: Insufficient documentation

## 2020-05-04 NOTE — Progress Notes (Signed)
MRN : [id number]  Dana Weber is a 60 y.o. (01/21/1961) female who presents with chief complaint of  Chief Complaint  Patient presents with  . Carotid    6 month follow up  .  History of Present Illness: The patient is seen for evaluation of carotid stenosis. The carotid stenosis was identified after duplex ultrasound was obtained for a left carotid bruit.  The patient denies amaurosis fugax. There is no recent history of TIA symptoms or focal motor deficits. There is no prior documented CVA.  There is no history of migraine headaches. There is no history of seizures.  The patient is taking enteric-coated aspirin 81 mg daily.  The patient has a history of coronary artery disease, no recent episodes of angina or shortness of breath. The patient denies PAD or claudication symptoms. There is a history of hyperlipidemia which is being treated with a statin.   Duplex ultrasound of the carotid arteries obtained today shows <40% RICA and 40-59% LICA  Antegrade vertebral arteries bilaterally  Current Meds  Medication Sig  . albuterol (VENTOLIN HFA) 108 (90 Base) MCG/ACT inhaler INHALE 2 PUFFS BY MOUTH EVERY 6 HOURS AS NEEDED FOR WHEEZING OR SHORTNESS OF BREATH  . benazepril (LOTENSIN) 5 MG tablet TAKE 1 TABLET BY MOUTH EVERY DAY  . benazepril (LOTENSIN) 5 MG tablet Take 1 tablet (5 mg total) by mouth daily.  . cyclobenzaprine (FLEXERIL) 5 MG tablet Take 1-2 tablets (5-10 mg total) by mouth 3 (three) times daily as needed (shoulder pain).  . dapagliflozin propanediol (FARXIGA) 10 MG TABS tablet Take 1 tablet (10 mg total) by mouth daily before breakfast.  . fluticasone (FLONASE) 50 MCG/ACT nasal spray Place 2 sprays into both nostrils daily.  Marland Kitchen. gabapentin (NEURONTIN) 300 MG capsule TAKE 1 CAPSULE BY MOUTH EVERYDAY AT BEDTIME  . lovastatin (MEVACOR) 20 MG tablet TAKE 1 TABLET BY MOUTH EVERY DAY  . meloxicam (MOBIC) 7.5 MG tablet Take 1-2 tablets (7.5-15 mg total) by mouth daily  as needed for pain.  . metFORMIN (GLUCOPHAGE-XR) 500 MG 24 hr tablet TAKE 2 TABLETS BY MOUTH TWICE A DAY  . PARoxetine (PAXIL) 40 MG tablet TAKE 1 TABLET BY MOUTH EVERY DAY  . PARoxetine (PAXIL) 40 MG tablet 1 TAB DAILY  . sitaGLIPtin (JANUVIA) 100 MG tablet Take 1 tablet (100 mg total) by mouth daily.  . traZODone (DESYREL) 100 MG tablet TAKE 1/2-1 TABLET BY MOUTH AT BEDTIME    Past Medical History:  Diagnosis Date  . Diabetes mellitus without complication (HCC)   . Fatty liver   . Headache    sinus  . Hyperlipidemia   . Hypertension   . Multilevel degenerative disc disease    "spurs" on spine also  . Vertigo    mild - daily - positional  . Wears dentures    partial lower    Past Surgical History:  Procedure Laterality Date  . ABDOMINAL HYSTERECTOMY    . CESAREAN SECTION    . COLONOSCOPY WITH PROPOFOL N/A 03/20/2015   Procedure: COLONOSCOPY WITH PROPOFOL;  Surgeon: [name], MD;  [facility];  Service: Endoscopy;  Laterality: N/A;  . DILATION AND CURETTAGE OF UTERUS    . ESOPHAGOGASTRODUODENOSCOPY (EGD) WITH PROPOFOL N/A 03/20/2015   Procedure: ESOPHAGOGASTRODUODENOSCOPY (EGD) WITH PROPOFOL;  Surgeon: [name], MD;  [facility];  Service: Endoscopy;  Laterality: N/A;  Diabetic - oral meds  . HEMORRHOID SURGERY    . LAPAROSCOPIC CHOLECYSTECTOMY    . LAPAROSCOPIC SUPRACERVICAL  HYSTERECTOMY  2008  . SALPINGOOPHORECTOMY Left 2008  . TONSILLECTOMY AND ADENOIDECTOMY  1960    Social History Social History   Tobacco Use  . Smoking status: Current Some Day Smoker    Packs/day: 0.25    Years: 42.00    Pack years: 10.50    Types: Cigarettes  . Smokeless tobacco: Never Used  . Tobacco comment: has a cigarette every few days  Vaping Use  . Vaping Use: Never used  Substance Use Topics  . Alcohol use: Yes    Alcohol/week: 0.0 standard drinks    Comment: OCCASIONALLY  . Drug use: No    Family History Family History  Problem  Relation Age of Onset  . Diabetes Mother   . Hypertension Mother   . Diabetes Father   . Hypertension Father   . Hypertension Brother   . Diabetes Paternal Grandmother   . Emphysema Paternal Grandfather   . Hypertension Brother     Allergies  Allergen Reactions  . Hydrocodone Swelling  . Penicillins Swelling  . Amoxicillin Swelling    Facial, and itching  . Hydrocodone Bitartrate Er Swelling  . Wellbutrin [Bupropion]     Makes her mean     REVIEW OF SYSTEMS (Negative unless checked)  Constitutional: [] Weight loss  [] Fever  [] Chills Cardiac: [] Chest pain   [] Chest pressure   [] Palpitations   [] Shortness of breath when laying flat   [] Shortness of breath with exertion. Vascular:  [x] Pain in legs with walking   [] Pain in legs at rest  [] History of DVT   [] Phlebitis   [] Swelling in legs   [] Varicose veins   [] Non-healing ulcers Pulmonary:   [] Uses home oxygen   [] Productive cough   [] Hemoptysis   [] Wheeze  [] COPD   [] Asthma Neurologic:  [] Dizziness   [] Seizures   [] History of stroke   [] History of TIA  [] Aphasia   [] Vissual changes   [] Weakness or numbness in arm   [] Weakness or numbness in leg Musculoskeletal:   [] Joint swelling   [x] Joint pain   [] Low back pain Hematologic:  [] Easy bruising  [] Easy bleeding   [] Hypercoagulable state   [] Anemic Gastrointestinal:  [] Diarrhea   [] Vomiting  [] Gastroesophageal reflux/heartburn   [] Difficulty swallowing. Genitourinary:  [] Chronic kidney disease   [] Difficult urination  [] Frequent urination   [] Blood in urine Skin:  [] Rashes   [] Ulcers  Psychological:  [] History of anxiety   []  History of major depression.  Physical Examination  Vitals:   05/01/20 1540  BP: (!) 149/76  Pulse: 84  Resp: 16  Weight: 116 lb (52.6 kg)   Body mass index is 22.65 kg/m. Gen: WD/WN, NAD Head: Dora/AT, No temporalis wasting.  Ear/Nose/Throat: Hearing grossly intact, nares w/o erythema or drainage Eyes: PER, EOMI, sclera nonicteric.  Neck: Supple, no  large masses.   Pulmonary:  Good air movement, no audible wheezing bilaterally, no use of accessory muscles.  Cardiac: RRR, no JVD Vascular: no bruits noted Vessel Right Left  Radial Palpable Palpable  Carotid Palpable Palpable  PT Trace Palpable Trace Palpable  DP Trace Palpable Trace Palpable  Gastrointestinal: Non-distended. No guarding/no peritoneal signs.  Musculoskeletal: M/S 5/5 throughout.  No deformity or atrophy.  Neurologic: CN 2-12 intact. Symmetrical.  Speech is fluent. Motor exam as listed above. Psychiatric: Judgment intact, Mood & affect appropriate for pt's clinical situation. Dermatologic: No rashes or ulcers noted.  No changes consistent with cellulitis.   CBC Lab Results  Component Value Date   WBC 8.1 04/16/2020   HGB 14.4 04/16/2020  HCT 42.0 04/16/2020   MCV 90.5 04/16/2020   PLT 287 04/16/2020    BMET    Component Value Date/Time   NA 136 04/16/2020 2156   NA 142 05/18/2019 0828   K 3.9 04/16/2020 2156   CL 98 04/16/2020 2156   CO2 26 04/16/2020 2156   GLUCOSE 207 (H) 04/16/2020 2156   BUN 17 04/16/2020 2156   BUN 10 05/18/2019 0828   CREATININE 0.53 04/16/2020 2156   CALCIUM 8.9 04/16/2020 2156   GFRNONAA >60 04/16/2020 2156   GFRAA 106 05/18/2019 0828   Estimated Creatinine Clearance: 55.1 mL/min (by C-G formula based on SCr of 0.53 mg/dL).  COAG No results found for: INR, PROTIME  Radiology DG Chest 2 View  Result Date: 04/16/2020 CLINICAL DATA:  Cough EXAM: CHEST - 2 VIEW COMPARISON:  12/23/2019 FINDINGS: The heart size and mediastinal contours are within normal limits. Both lungs are clear. The visualized skeletal structures are unremarkable. IMPRESSION: Negative. Electronically Signed   By: Charlett Nose M.D.   On: 04/16/2020 21:36   CT Head Wo Contrast  Result Date: 04/16/2020 CLINICAL DATA:  Headache EXAM: CT HEAD WITHOUT CONTRAST TECHNIQUE: Contiguous axial images were obtained from the base of the skull through the vertex without  intravenous contrast. COMPARISON:  None. FINDINGS: Brain: No acute intracranial abnormality. Specifically, no hemorrhage, hydrocephalus, mass lesion, acute infarction, or significant intracranial injury. Vascular: No hyperdense vessel or unexpected calcification. Skull: No acute calvarial abnormality. Sinuses/Orbits: No acute findings Other: None IMPRESSION: Normal study. Electronically Signed   By: Charlett Nose M.D.   On: 04/16/2020 21:37   VAS US CAROTID  Result Date: 05/01/2020 Carotid Arterial Duplex Study Indications:       Carotid artery disease. Comparison Study:  09/17/2019 Performing Technologist: Salvadore Farber RVT  Examination Guidelines: A complete evaluation includes B-mode imaging, spectral Doppler, color Doppler, and power Doppler as needed of all accessible portions of each vessel. Bilateral testing is considered an integral part of a complete examination. Limited examinations for reoccurring indications may be performed as noted.  Right Carotid Findings: +----------+--------+--------+--------+------------------+--------+           PSV cm/sEDV cm/sStenosisPlaque DescriptionComments +----------+--------+--------+--------+------------------+--------+ CCA Prox  82      30                                         +----------+--------+--------+--------+------------------+--------+ CCA Mid   90      29                                         +----------+--------+--------+--------+------------------+--------+ CCA Distal81      25                                         +----------+--------+--------+--------+------------------+--------+ ICA Prox  71      28      1-39%   heterogenous               +----------+--------+--------+--------+------------------+--------+ ICA Mid   76      28                                         +----------+--------+--------+--------+------------------+--------+  ICA Distal87      32                                          +----------+--------+--------+--------+------------------+--------+ ECA       79      15                                         +----------+--------+--------+--------+------------------+--------+ +----------+--------+-------+----------------+-------------------+           PSV cm/sEDV cmsDescribe        Arm Pressure (mmHG) +----------+--------+-------+----------------+-------------------+ [id number]             Multiphasic, WNL                    +----------+--------+-------+----------------+-------------------+ +---------+--------+--+--------+---------+ VertebralPSV cm/s31EDV cm/sAntegrade +---------+--------+--+--------+---------+ ICA/CCA ratio = .97 Left Carotid Findings: +----------+--------+--------+--------+------------------+--------+           PSV cm/sEDV cm/sStenosisPlaque DescriptionComments +----------+--------+--------+--------+------------------+--------+ CCA Prox  86      27                                         +----------+--------+--------+--------+------------------+--------+ CCA Mid   88      25                                         +----------+--------+--------+--------+------------------+--------+ CCA Distal79      25                                         +----------+--------+--------+--------+------------------+--------+ ICA Prox  155     58                                         +----------+--------+--------+--------+------------------+--------+ ICA Mid   162     53      40-59%  heterogenous               +----------+--------+--------+--------+------------------+--------+ ICA Distal69      25                                         +----------+--------+--------+--------+------------------+--------+ ECA       147     18                                         +----------+--------+--------+--------+------------------+--------+ +----------+--------+--------+----------------+-------------------+            PSV cm/sEDV cm/sDescribe        Arm Pressure (mmHG) +----------+--------+--------+----------------+-------------------+ [id number]             Multiphasic, WNL                    +----------+--------+--------+----------------+-------------------+ +---------+--------+--+--------+---------+ VertebralPSV cm/s48EDV cm/sAntegrade +---------+--------+--+--------+---------+ ICA/CCA ratio =  1.84  Summary: Right Carotid: Velocities in the right ICA are consistent with a 1-39% stenosis.                Non-hemodynamically significant plaque <50% noted in the CCA. The                ECA appears <50% stenosed. Left Carotid: Velocities in the left ICA are consistent with a 40-59% stenosis.               Non-hemodynamically significant plaque <50% noted in the CCA. The               ECA appears <50% stenosed.  *See table(s) above for measurements and observations.  Electronically signed by Levora Dredge MD on 05/01/2020 at 5:04:28 PM.    Final      Assessment/Plan 1. Carotid artery stenosis, asymptomatic, left Recommend:  Given the patient's asymptomatic subcritical stenosis no further invasive testing or surgery at this time.  Duplex ultrasound shows <40% RICA and 40-59% LICA stenosis.  Continue antiplatelet therapy as prescribed Continue management of CAD, HTN and Hyperlipidemia Healthy heart diet,  encouraged exercise at least 4 times per week Follow up in 12 months with duplex ultrasound and physical exam.  2. PAD (peripheral artery disease) (HCC)  Recommend:  The patient has evidence of atherosclerosis of the lower extremities with claudication.  The patient does not voice lifestyle limiting changes at this point in time.  Noninvasive studies do not suggest clinically significant change.  No invasive studies, angiography or surgery at this time The patient should continue walking and begin a more formal exercise program.  The patient should continue antiplatelet therapy and  aggressive treatment of the lipid abnormalities  No changes in the patient's medications at this time  The patient should continue wearing graduated compression socks 10-15 mmHg strength to control the mild edema.    3. Essential (primary) hypertension Continue antihypertensive medications as already ordered, these medications have been reviewed and there are no changes at this time.   4. Diabetes mellitus without complication (HCC) Continue hypoglycemic medications as already ordered, these medications have been reviewed and there are no changes at this time.  Hgb A1C to be monitored as already arranged by primary service   5. Hyperlipidemia, unspecified hyperlipidemia type Continue statin as ordered and reviewed, no changes at this time     Levora Dredge, MD  05/04/2020 3:15 PM

## 2020-05-15 ENCOUNTER — Other Ambulatory Visit: Payer: Self-pay | Admitting: Family Medicine

## 2020-05-15 DIAGNOSIS — I10 Essential (primary) hypertension: Secondary | ICD-10-CM

## 2020-05-15 DIAGNOSIS — M797 Fibromyalgia: Secondary | ICD-10-CM

## 2020-05-27 LAB — HM DIABETES EYE EXAM

## 2020-06-25 ENCOUNTER — Other Ambulatory Visit: Payer: Self-pay | Admitting: Family Medicine

## 2020-06-25 DIAGNOSIS — R0981 Nasal congestion: Secondary | ICD-10-CM

## 2020-07-28 ENCOUNTER — Other Ambulatory Visit: Payer: Self-pay | Admitting: Family Medicine

## 2020-07-28 DIAGNOSIS — G4700 Insomnia, unspecified: Secondary | ICD-10-CM

## 2020-07-28 DIAGNOSIS — M797 Fibromyalgia: Secondary | ICD-10-CM

## 2020-07-28 DIAGNOSIS — I10 Essential (primary) hypertension: Secondary | ICD-10-CM

## 2020-07-28 NOTE — Telephone Encounter (Signed)
Patient over for follow up office visit. Needs to be scheduled before refill can be approved.

## 2020-07-28 NOTE — Telephone Encounter (Signed)
Requested medication (s) are due for refill today: yes except Benazepril  Requested medication (s) are on the active medication list: yes  Last refill:  Gabapentin: 08/06/19    Trazodone: 04/03/20      Benazepril: 04/03/20  Lovastatin: 07/25/19  Future visit scheduled: no  Notes to clinic:  no refill protocol for these orders   Requested Prescriptions  Pending Prescriptions Disp Refills   gabapentin (NEURONTIN) 300 MG capsule [Pharmacy Med Name: GABAPENTIN 300 MG CAPSULE] 90 capsule 3    Sig: TAKE 1 CAPSULE BY MOUTH EVERYDAY AT BEDTIME      There is no refill protocol information for this order      traZODone (DESYREL) 100 MG tablet [Pharmacy Med Name: TRAZODONE 100 MG TABLET] 90 tablet 1    Sig: TAKE 1/2 TO 1 TABLET BY MOUTH AT BEDTIME      There is no refill protocol information for this order      benazepril (LOTENSIN) 5 MG tablet [Pharmacy Med Name: BENAZEPRIL HCL 5 MG TABLET] 90 tablet 0    Sig: TAKE 1 TABLET BY MOUTH EVERY DAY      There is no refill protocol information for this order      lovastatin (MEVACOR) 20 MG tablet [Pharmacy Med Name: LOVASTATIN 20 MG TABLET] 90 tablet 3    Sig: TAKE 1 TABLET BY MOUTH EVERY DAY      There is no refill protocol information for this order

## 2020-07-31 ENCOUNTER — Other Ambulatory Visit: Payer: Self-pay | Admitting: Family Medicine

## 2020-07-31 DIAGNOSIS — J069 Acute upper respiratory infection, unspecified: Secondary | ICD-10-CM

## 2020-07-31 NOTE — Telephone Encounter (Signed)
Requested Prescriptions  Pending Prescriptions Disp Refills  . albuterol (VENTOLIN HFA) 108 (90 Base) MCG/ACT inhaler [Pharmacy Med Name: ALBUTEROL HFA INH (200 PUFFS)8.5GM] 8.5 g 5    Sig: INHALE 2 PUFFS BY MOUTH EVERY 6 HOURS AS NEEDED FOR WHEEZING OR SHORTNESS OF BREATH     Pulmonology:  Beta Agonists Failed - 07/31/2020  3:13 AM      Failed - One inhaler should last at least one month. If the patient is requesting refills earlier, contact the patient to check for uncontrolled symptoms.      Passed - Valid encounter within last 12 months    Recent Outpatient Visits          6 months ago Bronchitis   [facility] Malva Limes, MD   10 months ago Diabetes mellitus without complication [facility])   [facility] Malva Limes, MD   12 months ago Acute reaction to situational stress   [facility] Chrismon, Jodell Cipro, PA-C   1 year ago Diabetes mellitus without complication [facility])   [facility] Malva Limes, MD   1 year ago Diabetes mellitus without complication [facility])   [facility] Malva Limes, MD

## 2020-07-31 NOTE — Telephone Encounter (Signed)
Please see message below regarding need for office vis.t

## 2020-07-31 NOTE — Telephone Encounter (Signed)
Please see message below regarding follow up visit.

## 2020-08-05 NOTE — Telephone Encounter (Signed)
Office visit scheduled for 08/13/2020 at 8am.

## 2020-08-13 ENCOUNTER — Other Ambulatory Visit: Payer: Self-pay

## 2020-08-13 ENCOUNTER — Encounter: Payer: Self-pay | Admitting: Family Medicine

## 2020-08-13 ENCOUNTER — Ambulatory Visit: Payer: BC Managed Care – PPO | Admitting: Family Medicine

## 2020-08-13 VITALS — BP 108/71 | HR 83 | Temp 97.9°F | Resp 18 | Wt 118.4 lb

## 2020-08-13 DIAGNOSIS — Z860100 Personal history of colon polyps, unspecified: Secondary | ICD-10-CM

## 2020-08-13 DIAGNOSIS — I739 Peripheral vascular disease, unspecified: Secondary | ICD-10-CM | POA: Diagnosis not present

## 2020-08-13 DIAGNOSIS — Z23 Encounter for immunization: Secondary | ICD-10-CM

## 2020-08-13 DIAGNOSIS — E1165 Type 2 diabetes mellitus with hyperglycemia: Secondary | ICD-10-CM | POA: Diagnosis not present

## 2020-08-13 DIAGNOSIS — E785 Hyperlipidemia, unspecified: Secondary | ICD-10-CM

## 2020-08-13 DIAGNOSIS — R300 Dysuria: Secondary | ICD-10-CM

## 2020-08-13 DIAGNOSIS — E559 Vitamin D deficiency, unspecified: Secondary | ICD-10-CM

## 2020-08-13 DIAGNOSIS — K760 Fatty (change of) liver, not elsewhere classified: Secondary | ICD-10-CM

## 2020-08-13 DIAGNOSIS — F410 Panic disorder [episodic paroxysmal anxiety] without agoraphobia: Secondary | ICD-10-CM

## 2020-08-13 DIAGNOSIS — R194 Change in bowel habit: Secondary | ICD-10-CM

## 2020-08-13 DIAGNOSIS — F3289 Other specified depressive episodes: Secondary | ICD-10-CM

## 2020-08-13 LAB — POCT URINALYSIS DIPSTICK
Bilirubin, UA: NEGATIVE
Glucose, UA: POSITIVE — AB
Ketones, UA: NEGATIVE
Leukocytes, UA: NEGATIVE
Nitrite, UA: NEGATIVE
Protein, UA: NEGATIVE
Spec Grav, UA: 1.01 (ref 1.010–1.025)
Urobilinogen, UA: 0.2 E.U./dL
pH, UA: 5 (ref 5.0–8.0)

## 2020-08-13 LAB — POCT GLYCOSYLATED HEMOGLOBIN (HGB A1C)
Est. average glucose Bld gHb Est-mCnc: 292
Hemoglobin A1C: 11.8 % — AB (ref 4.0–5.6)

## 2020-08-13 MED ORDER — PAROXETINE HCL 40 MG PO TABS: 40.0000 mg | ORAL_TABLET | Freq: Every day | ORAL | 2 refills | Status: DC

## 2020-08-13 MED ORDER — VENLAFAXINE HCL ER 37.5 MG PO CP24: 37.5000 mg | ORAL_CAPSULE | Freq: Every day | ORAL | 1 refills | Status: DC

## 2020-08-13 NOTE — Addendum Note (Signed)
Addended by: Awilda Bill L on: 08/13/2020 10:02 AM   Modules accepted: Orders, SmartSet

## 2020-08-13 NOTE — Progress Notes (Signed)
Established patient visit   Patient: Dana Weber   DOB: 10/07/1960   60 y.o. Female  MRN: [id number] Visit Date: 08/13/2020  Today's healthcare provider: Mila Merry, MD   Chief Complaint  Patient presents with  . Diabetes  . Hypertension  . Hyperlipidemia   Subjective    HPI  Hypertension, follow-up  BP Readings from Last 3 Encounters:  08/13/20 108/71  05/01/20 (!) 149/76  04/17/20 133/79   Wt Readings from Last 3 Encounters:  08/13/20 118 lb 6.4 oz (53.7 kg)  05/01/20 116 lb (52.6 kg)  04/16/20 111 lb (50.3 kg)     She was last seen for hypertension 11 months ago.  BP at that visit was 149/76. Management since that visit includes continue same medications.  She reports good compliance with treatment. She is not having side effects.  She is following a Regular diet. She is not exercising. She does smoke.   Use of agents associated with hypertension: NSAIDS.   Outside blood pressures are not checked. Symptoms: No chest pain No chest pressure  No palpitations No syncope  No dyspnea No orthopnea  No paroxysmal nocturnal dyspnea No lower extremity edema   Pertinent labs: Lab Results  Component Value Date   CHOL 155 05/18/2019   HDL 50 05/18/2019   LDLCALC 88 05/18/2019   TRIG 91 05/18/2019   CHOLHDL 3.1 05/18/2019   Lab Results  Component Value Date   NA 136 04/16/2020   K 3.9 04/16/2020   CREATININE 0.53 04/16/2020   GFRNONAA >60 04/16/2020   GFRAA 106 05/18/2019   GLUCOSE 207 (H) 04/16/2020     The 10-year ASCVD risk score Denman George DC Jr., et al., 2013) is: 10.1%   ---------------------------------------------------------------------------------------------------  Diabetes Mellitus Type II, Follow-up  Lab Results  Component Value Date   HGBA1C 7.9 (A) 09/11/2019   HGBA1C 8.3 (A) 05/14/2019   HGBA1C 7.6 (A) 01/08/2019   Wt Readings from Last 3 Encounters:  08/13/20 118 lb 6.4 oz (53.7 kg)  05/01/20 116 lb (52.6 kg)  04/16/20  111 lb (50.3 kg)   Last seen for diabetes 7 months ago.  Management since then includes continue same medication. She reports fair compliance with treatment.  She states she cut back dose of metformin for 2 tablets twice a day to 1 tablet twice a day about 2 months ago because she started have constipation and bloating, however this has not changed since change the medication.  .  Symptoms: No fatigue No foot ulcerations  No appetite changes No nausea  No paresthesia of the feet  No polydipsia  No polyuria No visual disturbances   No vomiting     Home blood sugar records: blood sugars are not checked  Episodes of hypoglycemia? No    Current insulin regiment: none Most Recent Eye Exam: 05/27/2020 Current exercise: none Current diet habits: well balanced  Pertinent Labs: Lab Results  Component Value Date   CHOL 155 05/18/2019   HDL 50 05/18/2019   LDLCALC 88 05/18/2019   TRIG 91 05/18/2019   CHOLHDL 3.1 05/18/2019   Lab Results  Component Value Date   NA 136 04/16/2020   K 3.9 04/16/2020   CREATININE 0.53 04/16/2020   GFRNONAA >60 04/16/2020   GFRAA 106 05/18/2019   GLUCOSE 207 (H) 04/16/2020     ---------------------------------------------------------------------------------------------------  Lipid/Cholesterol, Follow-up  Last lipid panel Other pertinent labs  Lab Results  Component Value Date   CHOL 155 05/18/2019   HDL 50 05/18/2019  LDLCALC 88 05/18/2019   TRIG 91 05/18/2019   CHOLHDL 3.1 05/18/2019   Lab Results  Component Value Date   ALT 35 (H) 05/18/2019   AST 27 05/18/2019   PLT 287 04/16/2020   TSH 1.210 06/07/2018     She was last seen for this 11 months ago.  Management since that visit includes continue same medications.  She reports good compliance with treatment. She is not having side effects.   Symptoms: No chest pain No chest pressure/discomfort  No dyspnea No lower extremity edema  No numbness or tingling of extremity No  orthopnea  No palpitations No paroxysmal nocturnal dyspnea  No speech difficulty No syncope   Current diet: well balanced Current exercise: none  The 10-year ASCVD risk score Denman George DC Jr., et al., 2013) is: 10.1%  ---------------------------------------------------------------------------------------------------     Medications: Outpatient Medications Prior to Visit  Medication Sig  . albuterol (VENTOLIN HFA) 108 (90 Base) MCG/ACT inhaler INHALE 2 PUFFS BY MOUTH EVERY 6 HOURS AS NEEDED FOR WHEEZING OR SHORTNESS OF BREATH  . benazepril (LOTENSIN) 5 MG tablet Take 1 tablet (5 mg total) by mouth daily.  . cyclobenzaprine (FLEXERIL) 5 MG tablet Take 1-2 tablets (5-10 mg total) by mouth 3 (three) times daily as needed (shoulder pain).  . dapagliflozin propanediol (FARXIGA) 10 MG TABS tablet Take 1 tablet (10 mg total) by mouth daily before breakfast.  . fluticasone (FLONASE) 50 MCG/ACT nasal spray SPRAY 2 SPRAYS INTO EACH NOSTRIL EVERY DAY  . gabapentin (NEURONTIN) 300 MG capsule Take 1 capsule (300 mg total) by mouth at bedtime.  . lovastatin (MEVACOR) 20 MG tablet Take 1 tablet (20 mg total) by mouth daily.  . meloxicam (MOBIC) 7.5 MG tablet Take 1-2 tablets (7.5-15 mg total) by mouth daily as needed for pain.  . metFORMIN (GLUCOPHAGE-XR) 500 MG 24 hr tablet TAKE 2 TABLETS BY MOUTH TWICE A DAY (Patient taking differently: Take 500 mg by mouth 2 (two) times daily with a meal.)  . PARoxetine (PAXIL) 40 MG tablet 1 TAB DAILY  . sitaGLIPtin (JANUVIA) 100 MG tablet Take 1 tablet (100 mg total) by mouth daily.  . traZODone (DESYREL) 100 MG tablet TAKE 1/2 TO 1 TABLET BY MOUTH AT BEDTIME  . [DISCONTINUED] benazepril (LOTENSIN) 5 MG tablet Take 1 tablet (5 mg total) by mouth daily.  . [DISCONTINUED] benzonatate (TESSALON PERLES) 100 MG capsule Take 1 capsule (100 mg total) by mouth 3 (three) times daily as needed for cough. (Patient not taking: Reported on 08/13/2020)  . [DISCONTINUED]  brompheniramine-pseudoephedrine-DM 30-2-10 MG/5ML syrup Take 5 mLs by mouth 4 (four) times daily as needed (for cough). (Patient not taking: Reported on 08/13/2020)  . [DISCONTINUED] PARoxetine (PAXIL) 40 MG tablet TAKE 1 TABLET BY MOUTH EVERY DAY   No facility-administered medications prior to visit.    Review of Systems  Constitutional: Negative for appetite change, chills, fatigue and fever.  Respiratory: Negative for chest tightness and shortness of breath.   Cardiovascular: Negative for chest pain and palpitations.  Gastrointestinal: Negative for abdominal pain, nausea and vomiting.  Genitourinary: Positive for dysuria.  Neurological: Negative for dizziness and weakness.       Objective    BP 108/71 (BP Location: Left Arm, Patient Position: Sitting, Cuff Size: Normal)   Pulse 83   Temp 97.9 F (36.6 C) (Temporal)   Resp 18   Wt 118 lb 6.4 oz (53.7 kg)   BMI 23.12 kg/m     Physical Exam    General: Appearance:  Well developed, well nourished female in no acute distress  Eyes:    PERRL, conjunctiva/corneas clear, EOM's intact       Lungs:     Clear to auscultation bilaterally, respirations unlabored  Heart:    Normal heart rate. Normal rhythm. No murmurs, rubs, or gallops.   MS:   All extremities are intact.   Neurologic:   Awake, alert, oriented x 3. No apparent focal neurological           defect.       Results for orders placed or performed in visit on 08/13/20  POCT HgB A1C  Result Value Ref Range   Hemoglobin A1C 11.8 (A) 4.0 - 5.6 %   Est. average glucose Bld gHb Est-mCnc 292     Depression screen [facility] 2/9 08/13/2020  Decreased Interest 2  Down, Depressed, Hopeless 2  PHQ - 2 Score 4  Altered sleeping 2  Tired, decreased energy 2  Change in appetite 2  Feeling bad or failure about yourself  2  Trouble concentrating 2  Moving slowly or fidgety/restless 2  Suicidal thoughts 1  PHQ-9 Score 17  Difficult doing work/chores Very difficult  \  Assessment &  Plan     1. Uncontrolled type 2 diabetes mellitus with hyperglycemia (HCC) She has only been taking 500mg  metformin twice daily for the last few months due to constipation, which doesn't seem to have improved. Will see how labs and either go back up on metformin or add glipizide.   2. PAD (peripheral artery disease) (HCC) Asymptomatic. Compliant with medication.  Continue aggressive risk factor modification.    3. Fatty liver Check labs today.   4. Avitaminosis D  - VITAMIN D 25 Hydroxy (Vit-D Deficiency, Fractures)  5. Hyperlipidemia, unspecified hyperlipidemia type  - CBC - Lipid panel - TSH  6. Personal history of colonic polyps  7. Bowel habit changes  - Ambulatory referral to Gastroenterology  8. Episodic paroxysmal anxiety disorder Well controlled with paroxetine which is refilled today.   9. Other depression Did not tolerating bupropion in the past. Will add low dose venlafaxine XR 37.5 daily.         The entirety of the information documented in the History of Present Illness, Review of Systems and Physical Exam were personally obtained by me. Portions of this information were initially documented by the CMA and reviewed by me for thoroughness and accuracy.      , MD  [facility] [phone] (phone) [phone] (fax)  [facility]

## 2020-08-13 NOTE — Patient Instructions (Addendum)
.   We'll probably need to adjust your medication when we get your labs back.

## 2020-08-14 ENCOUNTER — Encounter: Payer: Self-pay | Admitting: *Deleted

## 2020-08-14 ENCOUNTER — Other Ambulatory Visit: Payer: Self-pay | Admitting: Family Medicine

## 2020-08-14 LAB — CBC
Hematocrit: 44.5 % (ref 34.0–46.6)
Hemoglobin: 15 g/dL (ref 11.1–15.9)
MCH: 31.7 pg (ref 26.6–33.0)
MCHC: 33.7 g/dL (ref 31.5–35.7)
MCV: 94 fL (ref 79–97)
Platelets: 294 10*3/uL (ref 150–450)
RBC: 4.73 x10E6/uL (ref 3.77–5.28)
RDW: 11.8 % (ref 11.7–15.4)
WBC: 8.6 10*3/uL (ref 3.4–10.8)

## 2020-08-14 LAB — LIPID PANEL
Chol/HDL Ratio: 3.9 ratio (ref 0.0–4.4)
Cholesterol, Total: 191 mg/dL (ref 100–199)
HDL: 49 mg/dL (ref >39)
LDL Chol Calc (NIH): 105 mg/dL — ABNORMAL HIGH (ref 0–99)
Triglycerides: 217 mg/dL — ABNORMAL HIGH (ref 0–149)
VLDL Cholesterol Cal: 37 mg/dL (ref 5–40)

## 2020-08-14 LAB — TSH: TSH: 0.7 u[IU]/mL (ref 0.450–4.500)

## 2020-08-14 LAB — VITAMIN D 25 HYDROXY (VIT D DEFICIENCY, FRACTURES): Vit D, 25-Hydroxy: 26.9 ng/mL — ABNORMAL LOW (ref 30.0–100.0)

## 2020-08-14 MED ORDER — GLIPIZIDE ER 5 MG PO TB24: 5.0000 mg | ORAL_TABLET | Freq: Every day | ORAL | 1 refills | Status: DC

## 2020-08-15 LAB — URINE CULTURE

## 2020-09-05 ENCOUNTER — Other Ambulatory Visit: Payer: Self-pay | Admitting: Family Medicine

## 2020-09-05 DIAGNOSIS — F3289 Other specified depressive episodes: Secondary | ICD-10-CM

## 2020-09-05 NOTE — Telephone Encounter (Signed)
   Notes to clinic: REQUEST FOR 90 DAYS PRESCRIPTION. DX Code Needed.   Requested Prescriptions  Pending Prescriptions Disp Refills   venlafaxine XR (EFFEXOR-XR) 37.5 MG 24 hr capsule [Pharmacy Med Name: VENLAFAXINE HCL ER 37.5 MG CAP] 90 capsule 1    Sig: TAKE 1 CAPSULE BY MOUTH DAILY WITH BREAKFAST.      Psychiatry: Antidepressants - SNRI - desvenlafaxine & venlafaxine Failed - 09/05/2020  1:35 PM      Failed - LDL in normal range and within 360 days    LDL Chol Calc (NIH)  Date Value Ref Range Status  08/13/2020 105 (H) 0 - 99 mg/dL Final          Failed - Triglycerides in normal range and within 360 days    Triglycerides  Date Value Ref Range Status  08/13/2020 217 (H) 0 - 149 mg/dL Final          Passed - Total Cholesterol in normal range and within 360 days    Cholesterol, Total  Date Value Ref Range Status  08/13/2020 191 100 - 199 mg/dL Final          Passed - Completed PHQ-2 or PHQ-9 in the last 360 days      Passed - Last BP in normal range    BP Readings from Last 1 Encounters:  08/13/20 108/71          Passed - Valid encounter within last 6 months    Recent Outpatient Visits           3 weeks ago Uncontrolled type 2 diabetes mellitus with hyperglycemia [facility])   [facility] Malva Limes, MD   7 months ago Bronchitis   [facility] Malva Limes, MD   12 months ago Diabetes mellitus without complication [facility])   [facility] Malva Limes, MD   1 year ago Acute reaction to situational stress   [facility] Chrismon, Jodell Cipro, PA-C   1 year ago Diabetes mellitus without complication [facility])   [facility] Fisher, Demetrios Isaacs, MD       Future Appointments             In 1 week Midge Minium, MD [facility]   In 1 month Fisher, Demetrios Isaacs, MD [facility], [facility]

## 2020-09-17 ENCOUNTER — Other Ambulatory Visit: Payer: Self-pay

## 2020-09-17 ENCOUNTER — Encounter: Payer: Self-pay | Admitting: Gastroenterology

## 2020-09-17 ENCOUNTER — Ambulatory Visit: Payer: BC Managed Care – PPO | Admitting: Gastroenterology

## 2020-09-17 VITALS — BP 121/80 | HR 106 | Temp 97.8°F | Ht 60.0 in | Wt 117.0 lb

## 2020-09-17 DIAGNOSIS — Z86010 Personal history of colonic polyps: Secondary | ICD-10-CM

## 2020-09-17 DIAGNOSIS — K5900 Constipation, unspecified: Secondary | ICD-10-CM | POA: Diagnosis not present

## 2020-09-17 DIAGNOSIS — R194 Change in bowel habit: Secondary | ICD-10-CM

## 2020-09-17 NOTE — Progress Notes (Signed)
Gastroenterology Consultation  Referring Provider:     Malva Limes, MD Primary Care Physician:  Malva Limes, MD Primary Gastroenterologist:  Dr. Servando Snare     Reason for Consultation:     constipation        HPI:   Dana Weber is a 60 y.o. y/o female referred for consultation & management of constipation by Dr. Sherrie Mustache, Demetrios Isaacs, MD.  The patient has been having constipation for the last 2 month. The patient reports that she is been constipated the last few months.  The patient's last colonoscopy was roughly 6 years ago.  The patient denies any Black stools or bloody stools.  She does have some nausea that she states happens later in the day but does not have any vomiting.  The patient also denies any unexplained weight loss.  She denies changing her diet or no taking that may have been causing her to have worsening of her constipation.  Past Medical History:  Diagnosis Date   Diabetes mellitus without complication (HCC)    Fatty liver    Headache    sinus   Hyperlipidemia    Hypertension    Multilevel degenerative disc disease    "spurs" on spine also   Vertigo    mild - daily - positional   Wears dentures    partial lower    Past Surgical History:  Procedure Laterality Date   ABDOMINAL HYSTERECTOMY     CESAREAN SECTION     COLONOSCOPY WITH PROPOFOL N/A 03/20/2015   Procedure: COLONOSCOPY WITH PROPOFOL;  Surgeon: Midge Minium, MD;  Location: [facility];  Service: Endoscopy;  Laterality: N/A;   DILATION AND CURETTAGE OF UTERUS     ESOPHAGOGASTRODUODENOSCOPY (EGD) WITH PROPOFOL N/A 03/20/2015   Procedure: ESOPHAGOGASTRODUODENOSCOPY (EGD) WITH PROPOFOL;  Surgeon: Midge Minium, MD;  Location: [facility];  Service: Endoscopy;  Laterality: N/A;  Diabetic - oral meds   HEMORRHOID SURGERY     LAPAROSCOPIC CHOLECYSTECTOMY     LAPAROSCOPIC SUPRACERVICAL HYSTERECTOMY  2008   SALPINGOOPHORECTOMY Left 2008   TONSILLECTOMY AND ADENOIDECTOMY  1960     Prior to Admission medications   Medication Sig Start Date End Date Taking? Authorizing Provider  albuterol (VENTOLIN HFA) 108 (90 Base) MCG/ACT inhaler INHALE 2 PUFFS BY MOUTH EVERY 6 HOURS AS NEEDED FOR WHEEZING OR SHORTNESS OF BREATH 07/31/20  Yes Malva Limes, MD  aspirin EC 81 MG tablet Take 81 mg by mouth daily. Swallow whole.   Yes [provider]  benazepril (LOTENSIN) 5 MG tablet Take 1 tablet (5 mg total) by mouth daily. 08/05/20 10/29/21 Yes Malva Limes, MD  cyclobenzaprine (FLEXERIL) 5 MG tablet Take 1-2 tablets (5-10 mg total) by mouth 3 (three) times daily as needed (shoulder pain). 01/08/19  Yes Malva Limes, MD  dapagliflozin propanediol (FARXIGA) 10 MG TABS tablet Take 1 tablet (10 mg total) by mouth daily before breakfast. 01/11/20  Yes Malva Limes, MD  fluticasone [facility]) 50 MCG/ACT nasal spray SPRAY 2 SPRAYS INTO EACH NOSTRIL EVERY DAY 06/25/20  Yes Malva Limes, MD  gabapentin (NEURONTIN) 300 MG capsule Take 1 capsule (300 mg total) by mouth at bedtime. 08/05/20  Yes Malva Limes, MD  glipiZIDE (GLUCOTROL XL) 5 MG 24 hr tablet TAKE 1 TABLET BY MOUTH EVERY DAY WITH BREAKFAST 09/05/20  Yes Malva Limes, MD  lovastatin (MEVACOR) 20 MG tablet Take 1 tablet (20 mg total) by mouth daily. 08/05/20 10/29/21 Yes Malva Limes,  MD  meloxicam (MOBIC) 7.5 MG tablet Take 1-2 tablets (7.5-15 mg total) by mouth daily as needed for pain. 05/20/17  Yes Malva Limes, MD  metFORMIN (GLUCOPHAGE-XR) 500 MG 24 hr tablet TAKE 2 TABLETS BY MOUTH TWICE A DAY Patient taking differently: Take 500 mg by mouth 2 (two) times daily with a meal. 04/03/20  Yes Malva Limes, MD  PARoxetine (PAXIL) 40 MG tablet Take 1 tablet (40 mg total) by mouth daily. 08/13/20  Yes Malva Limes, MD  sitaGLIPtin (JANUVIA) 100 MG tablet Take 1 tablet (100 mg total) by mouth daily. 02/12/20  Yes Malva Limes, MD  traZODone (DESYREL) 100 MG tablet TAKE 1/2 TO 1 TABLET BY MOUTH AT  BEDTIME 08/05/20  Yes Malva Limes, MD  venlafaxine XR (EFFEXOR-XR) 37.5 MG 24 hr capsule Take 1 capsule (37.5 mg total) by mouth daily with breakfast. 09/05/20  Yes Malva Limes, MD    Family History  Problem Relation Age of Onset   Diabetes Mother    Hypertension Mother    Diabetes Father    Hypertension Father    Hypertension Brother    Diabetes Paternal Grandmother    Emphysema Paternal Grandfather    Hypertension Brother      Social History   Tobacco Use   Smoking status: Some Days    Packs/day: 0.25    Years: 42.00    Pack years: 10.50    Types: Cigarettes   Smokeless tobacco: Never   Tobacco comments:    has a cigarette every few days  Vaping Use   Vaping Use: Never used  Substance Use Topics   Alcohol use: Yes    Alcohol/week: 0.0 standard drinks    Comment: OCCASIONALLY   Drug use: No    Allergies as of 09/17/2020 - Review Complete 09/17/2020  Allergen Reaction Noted   Hydrocodone Swelling 10/23/2018   Penicillins Swelling 10/23/2018   Amoxicillin Swelling 11/05/2014   Hydrocodone bitartrate er Swelling 11/05/2014   Wellbutrin [bupropion]  01/08/2019    Review of Systems:    All systems reviewed and negative except where noted in HPI.   Physical Exam:  BP 134/82 (BP Location: Left Arm, Patient Position: Sitting, Cuff Size: Normal)   Pulse (!) 106   Temp 97.8 F (36.6 C) (Temporal)   Ht 5' (1.524 m)   Wt 117 lb (53.1 kg)   BMI 22.85 kg/m  No LMP recorded. Patient has had a hysterectomy. General:   Alert,  Well-developed, well-nourished, pleasant and cooperative in NAD Head:  Normocephalic and atraumatic. Eyes:  Sclera clear, no icterus.   Conjunctiva pink. Ears:  Normal auditory acuity. Neck:  Supple; no masses or thyromegaly. Lungs:  Respirations even and unlabored.  Clear throughout to auscultation.   No wheezes, crackles, or rhonchi. No acute distress. Heart:  Regular rate and rhythm; no murmurs, clicks, rubs, or gallops. Abdomen:   Normal bowel sounds.  No bruits.  Soft, non-tender and non-distended without masses, hepatosplenomegaly or hernias noted.  No guarding or rebound tenderness.  Negative Carnett sign.   Rectal:  Deferred.  Pulses:  Normal pulses noted. Extremities:  No clubbing or edema.  No cyanosis. Neurologic:  Alert and oriented x3;  grossly normal neurologically. Skin:  Intact without significant lesions or rashes.  No jaundice. Lymph Nodes:  No significant cervical adenopathy. Psych:  Alert and cooperative. Normal mood and affect.  Imaging Studies: No results found.  Assessment and Plan:   Dana Weber is a 60 y.o. y/o  female who comes in with constipation the last 2 months.  The patient has not had any unexplained weight loss black stools bloody stools fevers or chills.  The patient had a colonoscopy probably 6 years ago.  The patient will be set up for colonoscopy due to her change in bowel habits. The patient has been explained the plan and agrees with it.    Midge Minium, MD. Clementeen Graham    Note: This dictation was prepared with Dragon dictation along with smaller phrase technology. Any transcriptional errors that result from this process are unintentional.

## 2020-09-18 ENCOUNTER — Ambulatory Visit: Payer: BC Managed Care – PPO | Admitting: Obstetrics and Gynecology

## 2020-09-18 ENCOUNTER — Other Ambulatory Visit (HOSPITAL_COMMUNITY)
Admission: RE | Admit: 2020-09-18 | Discharge: 2020-09-18 | Disposition: A | Discharge status: Home / Self Care | Payer: BC Managed Care – PPO | Source: Ambulatory Visit | Attending: Obstetrics and Gynecology | Admitting: Obstetrics and Gynecology

## 2020-09-18 ENCOUNTER — Encounter: Payer: Self-pay | Admitting: Obstetrics and Gynecology

## 2020-09-18 VITALS — BP 120/72 | Ht 60.0 in | Wt 118.0 lb

## 2020-09-18 DIAGNOSIS — Z113 Encounter for screening for infections with a predominantly sexual mode of transmission: Secondary | ICD-10-CM | POA: Insufficient documentation

## 2020-09-18 DIAGNOSIS — N761 Subacute and chronic vaginitis: Secondary | ICD-10-CM | POA: Diagnosis not present

## 2020-09-18 LAB — POCT WET PREP WITH KOH
Clue Cells Wet Prep HPF POC: NEGATIVE
KOH Prep POC: NEGATIVE
Trichomonas, UA: NEGATIVE
Yeast Wet Prep HPF POC: NEGATIVE

## 2020-09-18 MED ORDER — CLOTRIMAZOLE-BETAMETHASONE 1-0.05 % EX CREA
TOPICAL_CREAM | CUTANEOUS | 0 refills | Status: DC
Start: 2020-09-18 — End: 2022-03-26

## 2020-09-18 MED ORDER — FLUCONAZOLE 150 MG PO TABS: 150.0000 mg | ORAL_TABLET | Freq: Once | ORAL | 0 refills | Status: AC

## 2020-09-18 MED ORDER — CLOTRIMAZOLE-BETAMETHASONE 1-0.05 % EX CREA: TOPICAL_CREAM | CUTANEOUS | 0 refills | Status: DC

## 2020-09-18 MED ORDER — FLUCONAZOLE 150 MG PO TABS: 150.0000 mg | ORAL_TABLET | Freq: Once | ORAL | 0 refills | Status: DC

## 2020-09-18 NOTE — Progress Notes (Signed)
Dana Limes, MD   Chief Complaint  Patient presents with   Vaginal Itching    Irritation, some swelling, some discharge, no odor x 2 months    HPI:      [name] is a 60 y.o. No obstetric history on file. whose LMP was No LMP recorded. Patient has had a hysterectomy., presents today for vaginal itching, irritation, swelling for a few months, no fishy odor. Treated with monistat-7 and 3 without relief, last treated about a month ago. Hx of DM that has not been well controlled recently. Has irritation with wiping around the urethra and pt has noticed scant bleeding with wiping occas. Uses dove soap (not sensitive skin) or water and dryer sheets. Hx of hyst.  She is sex active, would like STD testing to be safe.  Hx of grade 2 rectocele and cystocele  Past Medical History:  Diagnosis Date   Diabetes mellitus without complication (HCC)    Fatty liver    Headache    sinus   Hyperlipidemia    Hypertension    Multilevel degenerative disc disease    "spurs" on spine also   Vertigo    mild - daily - positional   Wears dentures    partial lower    Past Surgical History:  Procedure Laterality Date   ABDOMINAL HYSTERECTOMY     CESAREAN SECTION     COLONOSCOPY WITH PROPOFOL N/A 03/20/2015   Procedure: COLONOSCOPY WITH PROPOFOL;  Surgeon: Midge Minium, MD;  Location: [facility];  Service: Endoscopy;  Laterality: N/A;   DILATION AND CURETTAGE OF UTERUS     ESOPHAGOGASTRODUODENOSCOPY (EGD) WITH PROPOFOL N/A 03/20/2015   Procedure: ESOPHAGOGASTRODUODENOSCOPY (EGD) WITH PROPOFOL;  Surgeon: Midge Minium, MD;  Location: [facility];  Service: Endoscopy;  Laterality: N/A;  Diabetic - oral meds   HEMORRHOID SURGERY     LAPAROSCOPIC CHOLECYSTECTOMY     LAPAROSCOPIC SUPRACERVICAL HYSTERECTOMY  2008   SALPINGOOPHORECTOMY Left 2008   TONSILLECTOMY AND ADENOIDECTOMY  1960    Family History  Problem Relation Age of Onset   Diabetes Mother    Hypertension  Mother    Diabetes Father    Hypertension Father    Hypertension Brother    Diabetes Paternal Grandmother    Emphysema Paternal Grandfather    Hypertension Brother     Social History   Socioeconomic History   Marital status: Married    Spouse name: Not on file   Number of children: Not on file   Years of education: Not on file   Highest education level: Not on file  Occupational History   Not on file  Tobacco Use   Smoking status: Some Days    Packs/day: 0.25    Years: 42.00    Pack years: 10.50    Types: Cigarettes   Smokeless tobacco: Never   Tobacco comments:    has a cigarette every few days  Vaping Use   Vaping Use: Never used  Substance and Sexual Activity   Alcohol use: Yes    Alcohol/week: 0.0 standard drinks    Comment: OCCASIONALLY   Drug use: No   Sexual activity: Yes    Birth control/protection: Surgical    Comment: hysterectomy   Other Topics Concern   Not on file  Social History Narrative   Not on file   Social Determinants of Health   Financial Resource Strain: Not on file  Food Insecurity: Not on file  Transportation Needs: Not on file  Physical Activity: Not on file  Stress: Not on file  Social Connections: Not on file  Intimate Partner Violence: Not on file    Outpatient Medications Prior to Visit  Medication Sig Dispense Refill   albuterol (VENTOLIN HFA) 108 (90 Base) MCG/ACT inhaler INHALE 2 PUFFS BY MOUTH EVERY 6 HOURS AS NEEDED FOR WHEEZING OR SHORTNESS OF BREATH 8.5 g 5   aspirin EC 81 MG tablet Take 81 mg by mouth daily. Swallow whole.     benazepril (LOTENSIN) 5 MG tablet Take 1 tablet (5 mg total) by mouth daily. 90 tablet 4   cyclobenzaprine (FLEXERIL) 5 MG tablet Take 1-2 tablets (5-10 mg total) by mouth 3 (three) times daily as needed (shoulder pain). 30 tablet 1   dapagliflozin propanediol (FARXIGA) 10 MG TABS tablet Take 1 tablet (10 mg total) by mouth daily before breakfast. 90 tablet 3   fluticasone (FLONASE) 50 MCG/ACT  nasal spray SPRAY 2 SPRAYS INTO EACH NOSTRIL EVERY DAY 48 mL 2   gabapentin (NEURONTIN) 300 MG capsule Take 1 capsule (300 mg total) by mouth at bedtime. 90 capsule 0   glipiZIDE (GLUCOTROL XL) 5 MG 24 hr tablet TAKE 1 TABLET BY MOUTH EVERY DAY WITH BREAKFAST 30 tablet 1   lovastatin (MEVACOR) 20 MG tablet Take 1 tablet (20 mg total) by mouth daily. 90 tablet 4   meloxicam (MOBIC) 7.5 MG tablet Take 1-2 tablets (7.5-15 mg total) by mouth daily as needed for pain. 30 tablet 4   metFORMIN (GLUCOPHAGE-XR) 500 MG 24 hr tablet TAKE 2 TABLETS BY MOUTH TWICE A DAY (Patient taking differently: Take 500 mg by mouth 2 (two) times daily with a meal.) 360 tablet 1   PARoxetine (PAXIL) 40 MG tablet Take 1 tablet (40 mg total) by mouth daily. 90 tablet 2   sitaGLIPtin (JANUVIA) 100 MG tablet Take 1 tablet (100 mg total) by mouth daily. 90 tablet 4   traZODone (DESYREL) 100 MG tablet TAKE 1/2 TO 1 TABLET BY MOUTH AT BEDTIME 90 tablet 0   venlafaxine XR (EFFEXOR-XR) 37.5 MG 24 hr capsule Take 1 capsule (37.5 mg total) by mouth daily with breakfast. 90 capsule 0   No facility-administered medications prior to visit.      ROS:  Review of Systems  Constitutional:  Negative for fever.  Gastrointestinal:  Negative for blood in stool, constipation, diarrhea, nausea and vomiting.  Genitourinary:  Positive for vaginal discharge. Negative for dyspareunia, dysuria, flank pain, frequency, hematuria, urgency, vaginal bleeding and vaginal pain.  Musculoskeletal:  Negative for back pain.  Skin:  Negative for rash.  BREAST: No symptoms   OBJECTIVE:   Vitals:  BP 120/72   Ht 5' (1.524 m)   Wt 118 lb (53.5 kg)   BMI 23.05 kg/m   Physical Exam Vitals reviewed.  Constitutional:      Appearance: She is well-developed.  Pulmonary:     Effort: Pulmonary effort is normal.  Genitourinary:    Pubic Area: No rash.      Labia:        Right: Tenderness present. No rash or lesion.        Left: Tenderness present.  No rash or lesion.      Urethra: No prolapse or urethral lesion.     Vagina: Normal. No vaginal discharge, erythema or tenderness.     Uterus: Absent. Not tender.      Adnexa: Right adnexa normal and left adnexa normal.       Right: No mass or tenderness.  Left: No mass or tenderness.       Comments: BILAT LABIA MINORA AND MAJORA WITH ERYTHEMA, SWELLING, IRRITATION; NO LESIONS Musculoskeletal:        General: Normal range of motion.     Cervical back: Normal range of motion.  Skin:    General: Skin is warm and dry.  Neurological:     General: No focal deficit present.     Mental Status: She is alert and oriented to person, place, and time.  Psychiatric:        Mood and Affect: Mood normal.        Behavior: Behavior normal.        Thought Content: Thought content normal.        Judgment: Judgment normal.    Results: Results for orders placed or performed in visit on 09/18/20 (from the past 24 hour(s))  POCT Wet Prep with KOH     Status: Normal   Collection Time: 09/18/20  3:07 PM  Result Value Ref Range   Trichomonas, UA Negative    Clue Cells Wet Prep HPF POC neg    Epithelial Wet Prep HPF POC     Yeast Wet Prep HPF POC neg    Bacteria Wet Prep HPF POC     RBC Wet Prep HPF POC     WBC Wet Prep HPF POC     KOH Prep POC Negative Negative     Assessment/Plan: Subacute vaginitis - Plan: clotrimazole-betamethasone (LOTRISONE) cream, fluconazole (DIFLUCAN) 150 MG tablet, Cervicovaginal ancillary only, POCT Wet Prep with KOH, pos sx and exam, neg wet prep. Looks fungal, check cultre.  Rx diflucan and lotrisone crm. Sx should improve with improved glucose control. Dove sens skin soap, no dryer sheets. F/u prn.   Screening for STD (sexually transmitted disease) - Plan: Cervicovaginal ancillary only   Meds ordered this encounter  Medications   DISCONTD: fluconazole (DIFLUCAN) 150 MG tablet    Sig: Take 1 tablet (150 mg total) by mouth once for 1 dose. May repeat in 3 days  if still having symptoms    Dispense:  2 tablet    Refill:  0    Order Specific Question:   Supervising Provider    Answer:   Nadara Mustard [[id number]]   DISCONTD: clotrimazole-betamethasone (LOTRISONE) cream    Sig: Apply externally BID prn sx up to 2 wks    Dispense:  15 g    Refill:  0    Order Specific Question:   Supervising Provider    Answer:   Nadara Mustard [[id number]]   clotrimazole-betamethasone (LOTRISONE) cream    Sig: Apply externally BID prn sx up to 2 wks    Dispense:  15 g    Refill:  0    Order Specific Question:   Supervising Provider    Answer:   Nadara Mustard [[id number]]   fluconazole (DIFLUCAN) 150 MG tablet    Sig: Take 1 tablet (150 mg total) by mouth once for 1 dose. May repeat in 3 days if still having symptoms    Dispense:  2 tablet    Refill:  0    Order Specific Question:   Supervising Provider    Answer:   Nadara Mustard [[id number]]       Return if symptoms worsen or fail to improve.  Dermot Gremillion B. Clemence Lengyel, PA-C 09/18/2020 3:08 PM

## 2020-09-23 LAB — CERVICOVAGINAL ANCILLARY ONLY
Candida Glabrata: NEGATIVE
Candida Vaginitis: POSITIVE — AB
Chlamydia: NEGATIVE
Comment: NEGATIVE
Comment: NEGATIVE
Comment: NEGATIVE
Comment: NEGATIVE
Comment: NORMAL
Neisseria Gonorrhea: NEGATIVE
Trichomonas: NEGATIVE

## 2020-10-02 ENCOUNTER — Other Ambulatory Visit: Payer: Self-pay | Admitting: Family Medicine

## 2020-10-13 ENCOUNTER — Other Ambulatory Visit: Payer: Self-pay | Admitting: Family Medicine

## 2020-10-13 DIAGNOSIS — F3289 Other specified depressive episodes: Secondary | ICD-10-CM

## 2020-10-15 ENCOUNTER — Ambulatory Visit: Payer: BC Managed Care – PPO | Admitting: Family Medicine

## 2020-10-24 ENCOUNTER — Other Ambulatory Visit: Payer: Self-pay

## 2020-10-24 ENCOUNTER — Encounter: Payer: Self-pay | Admitting: Family Medicine

## 2020-10-24 ENCOUNTER — Ambulatory Visit: Payer: BC Managed Care – PPO | Admitting: Family Medicine

## 2020-10-24 VITALS — BP 130/70 | HR 85 | Temp 97.9°F | Resp 16 | Ht 60.0 in | Wt 117.0 lb

## 2020-10-24 DIAGNOSIS — G4700 Insomnia, unspecified: Secondary | ICD-10-CM

## 2020-10-24 DIAGNOSIS — E119 Type 2 diabetes mellitus without complications: Secondary | ICD-10-CM

## 2020-10-24 DIAGNOSIS — H60501 Unspecified acute noninfective otitis externa, right ear: Secondary | ICD-10-CM | POA: Diagnosis not present

## 2020-10-24 DIAGNOSIS — R5383 Other fatigue: Secondary | ICD-10-CM

## 2020-10-24 LAB — POCT GLYCOSYLATED HEMOGLOBIN (HGB A1C)
Est. average glucose Bld gHb Est-mCnc: 194
Hemoglobin A1C: 8.4 % — AB (ref 4.0–5.6)

## 2020-10-24 MED ORDER — NEOMYCIN-POLYMYXIN-HC 3.5-10000-1 OT SOLN: 3.0000 [drp] | Freq: Four times a day (QID) | OTIC | 0 refills | Status: AC

## 2020-10-24 MED ORDER — GLIPIZIDE ER 5 MG PO TB24: ORAL_TABLET | ORAL | 1 refills | Status: DC

## 2020-10-24 NOTE — Progress Notes (Signed)
I,April Miller,acting as a scribe for Mila Merry, MD.,have documented all relevant documentation on the behalf of Mila Merry, MD,as directed by  Mila Merry, MD while in the presence of Mila Merry, MD.   Established patient visit   Patient: Dana Weber   DOB: 08-03-1960   60 y.o. Female  MRN: [id number] Visit Date: 10/24/2020  Today's healthcare provider: Mila Merry, MD   Chief Complaint  Patient presents with   Follow-up   Diabetes   Subjective    HPI  Diabetes Mellitus Type II, follow-up  Lab Results  Component Value Date   HGBA1C 11.8 (A) 08/13/2020   HGBA1C 7.9 (A) 09/11/2019   HGBA1C 8.3 (A) 05/14/2019   Last seen for diabetes 2 months ago.  Management since then includes; labs checked showing-started glipizide to help get sugar down. Also sent prescription forvenlafaxine for depression. Please schedule follow up in 6-8 weeks.  She reports good compliance with treatment. She is not having side effects. none  Home blood sugar records: fasting range: 80-129  Episodes of hypoglycemia? No none   Current insulin regiment: n/a Most Recent Eye Exam: 05/27/2020  ----------------------------------------------------------------------------------------------      Medications: Outpatient Medications Prior to Visit  Medication Sig   albuterol (VENTOLIN HFA) 108 (90 Base) MCG/ACT inhaler INHALE 2 PUFFS BY MOUTH EVERY 6 HOURS AS NEEDED FOR WHEEZING OR SHORTNESS OF BREATH   aspirin EC 81 MG tablet Take 81 mg by mouth daily. Swallow whole.   benazepril (LOTENSIN) 5 MG tablet Take 1 tablet (5 mg total) by mouth daily.   clotrimazole-betamethasone (LOTRISONE) cream Apply externally BID prn sx up to 2 wks   cyclobenzaprine (FLEXERIL) 5 MG tablet Take 1-2 tablets (5-10 mg total) by mouth 3 (three) times daily as needed (shoulder pain).   dapagliflozin propanediol (FARXIGA) 10 MG TABS tablet Take 1 tablet (10 mg total) by mouth daily before breakfast.    fluticasone (FLONASE) 50 MCG/ACT nasal spray SPRAY 2 SPRAYS INTO EACH NOSTRIL EVERY DAY   gabapentin (NEURONTIN) 300 MG capsule Take 1 capsule (300 mg total) by mouth at bedtime.   glipiZIDE (GLUCOTROL XL) 5 MG 24 hr tablet TAKE 1 TABLET BY MOUTH EVERY DAY WITH BREAKFAST   lovastatin (MEVACOR) 20 MG tablet Take 1 tablet (20 mg total) by mouth daily.   meloxicam (MOBIC) 7.5 MG tablet Take 1-2 tablets (7.5-15 mg total) by mouth daily as needed for pain.   metFORMIN (GLUCOPHAGE-XR) 500 MG 24 hr tablet TAKE 2 TABLETS BY MOUTH TWICE A DAY (Patient taking differently: Take 500 mg by mouth 2 (two) times daily with a meal.)   PARoxetine (PAXIL) 40 MG tablet Take 1 tablet (40 mg total) by mouth daily.   sitaGLIPtin (JANUVIA) 100 MG tablet Take 1 tablet (100 mg total) by mouth daily.   traZODone (DESYREL) 100 MG tablet TAKE 1/2 TO 1 TABLET BY MOUTH AT BEDTIME   venlafaxine XR (EFFEXOR-XR) 37.5 MG 24 hr capsule TAKE 1 CAPSULE BY MOUTH DAILY WITH BREAKFAST.   No facility-administered medications prior to visit.    Review of Systems  Constitutional:  Negative for appetite change, chills, fatigue and fever.  Respiratory:  Negative for chest tightness and shortness of breath.   Cardiovascular:  Negative for chest pain and palpitations.  Gastrointestinal:  Negative for abdominal pain, nausea and vomiting.  Neurological:  Negative for dizziness and weakness.      Objective    BP (!) 152/84 (BP Location: Right Arm, Patient Position: Sitting, Cuff Size: Normal)  Pulse 85   Temp 97.9 F (36.6 C) (Temporal)   Resp 16   Ht 5' (1.524 m)   Wt 117 lb (53.1 kg)   SpO2 97%   BMI 22.85 kg/m     Physical Exam  General appearance: Well developed, well nourished female, cooperative and in no acute distress Head: Normocephalic, without obvious abnormality, atraumatic. Mildly inflamed right ear canal.  Respiratory: Respirations even and unlabored, normal respiratory rate Extremities: All extremities are  intact.  Skin: Skin color, texture, turgor normal. No rashes seen  Psych: Appropriate mood and affect. Neurologic: Mental status: Alert, oriented to person, place, and time, thought content appropriate.   Results for orders placed or performed in visit on 10/24/20  POCT glycosylated hemoglobin (Hb A1C)  Result Value Ref Range   Hemoglobin A1C 8.4 (A) 4.0 - 5.6 %   Est. average glucose Bld gHb Est-mCnc 194     Assessment & Plan     1. Diabetes mellitus without complication (HCC) Much better with addition of  glipiZIDE (GLUCOTROL XL) 5 MG 24 hr tablet; TAKE 1 TABLET BY MOUTH EVERY DAY WITH BREAKFAST  Dispense: 90 tablet; Refill: 1 She continues to work on improving diet.   2. Acute otitis externa of right ear, unspecified type  - neomycin-polymyxin-hydrocortisone (CORTISPORIN) OTIC solution; Place 3 drops into the right ear 4 (four) times daily for 7 days.  Dispense: 10 mL; Refill: 0  3. Other fatigue Chronic. Minimal improvement since starting trazodone for sleep.   4. Insomnia, unspecified type  She has only been taking 1/2 tablet of trazodone a few days a week. She usually gets about 6 hours sleep when she takes it, but some nights when she takes it she still doesn't sleep well and feels even more fatigued the next day. Suggested she try taking a full tablet  Future Appointments  Date Time Provider Department Center  11/21/2020  9:15 AM Toney Reil, MD [facility] None  02/24/2021  4:00 PM Malva Limes, MD [facility]  04/30/2021  2:30 PM [facility] 3 [facility] None  04/30/2021  3:30 PM [facility] 3 [facility] None  04/30/2021  4:00 PM Schnier, Latina Craver, MD [facility] None        The entirety of the information documented in the History of Present Illness, Review of Systems and Physical Exam were personally obtained by me. Portions of this information were initially documented by the CMA and reviewed by me for thoroughness and accuracy.     Mila Merry, MD  [facility] [phone] (phone) [phone] (fax)  [facility]

## 2020-10-27 ENCOUNTER — Encounter: Payer: Self-pay | Admitting: Obstetrics and Gynecology

## 2020-10-27 LAB — HM MAMMOGRAPHY

## 2020-10-31 ENCOUNTER — Telehealth: Payer: Self-pay

## 2020-10-31 NOTE — Telephone Encounter (Signed)
I called to advise patient that our office that her recent mammogram done on 10/27/2020 at [facility] hospital was normal. Patient should repeat in 1 year. Left message to call back. OK for [facility] triage to advise.

## 2020-10-31 NOTE — Telephone Encounter (Signed)
Pt given mammogram results per notes of Dr. Sherrie Mustache on 10/31/20 below. Pt verbalized understanding.

## 2020-11-04 ENCOUNTER — Ambulatory Visit: Payer: BC Managed Care – PPO | Admitting: Obstetrics and Gynecology

## 2020-11-04 ENCOUNTER — Encounter: Payer: Self-pay | Admitting: Obstetrics and Gynecology

## 2020-11-04 ENCOUNTER — Other Ambulatory Visit: Payer: Self-pay

## 2020-11-04 VITALS — BP 130/90 | Ht 60.0 in | Wt 118.0 lb

## 2020-11-04 DIAGNOSIS — B3731 Acute candidiasis of vulva and vagina: Secondary | ICD-10-CM | POA: Insufficient documentation

## 2020-11-04 DIAGNOSIS — B373 Candidiasis of vulva and vagina: Secondary | ICD-10-CM

## 2020-11-04 LAB — POCT WET PREP WITH KOH
Clue Cells Wet Prep HPF POC: NEGATIVE
KOH Prep POC: NEGATIVE
Trichomonas, UA: NEGATIVE

## 2020-11-04 MED ORDER — FLUCONAZOLE 150 MG PO TABS: 150.0000 mg | ORAL_TABLET | Freq: Once | ORAL | 0 refills | Status: AC

## 2020-11-04 NOTE — Progress Notes (Signed)
Malva Limes, MD   Chief Complaint  Patient presents with   Vaginal Irritation    Some itching, no discharge or odor x 3 weeks    HPI:      Dana Weber is a 60 y.o. No obstetric history on file. whose LMP was No LMP recorded. Patient has had a hysterectomy., presents today for yeast vag sx again. Having itching and irritation, no icreased d/c or odor. Sx for a couple wks. Treated for yeast vag 6/22 (candida albicans confirmed on culture) with diflucan x 2 and lotrisone crm with relief until recently. Has resumed lotrisone crm but sx persist. Changed to dove sens skin soap, stopped using dryer sheets. Has uncontrolled DM but labs getting much better with tx. Went from [facility] of 11.8 5/22 to 8.4 8/22. Also gets urethral pain with sx that resolved with yeast tx 6/22, no other urin sx, no unusual LBP, no pelvic pain/fevers.  Also with bad vasomotor sx. 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 Never did HRT. Already on effexor, paxil and gabapentin  Past Medical History:  Diagnosis Date   Diabetes mellitus without complication (HCC)    Fatty liver    Headache    sinus   Hyperlipidemia    Hypertension    Multilevel degenerative disc disease    "spurs" on spine also   Vertigo    mild - daily - positional   Wears dentures    partial lower    Past Surgical History:  Procedure Laterality Date   ABDOMINAL HYSTERECTOMY     CESAREAN SECTION     COLONOSCOPY WITH PROPOFOL N/A 03/20/2015   Procedure: COLONOSCOPY WITH PROPOFOL;  Surgeon: Midge Minium, MD;  Location: [facility];  Service: Endoscopy;  Laterality: N/A;   DILATION AND CURETTAGE OF UTERUS     ESOPHAGOGASTRODUODENOSCOPY (EGD) WITH PROPOFOL N/A 03/20/2015   Procedure: ESOPHAGOGASTRODUODENOSCOPY (EGD) WITH PROPOFOL;  Surgeon: Midge Minium, MD;  Location: [facility];  Service: Endoscopy;  Laterality: N/A;  Diabetic - oral meds   HEMORRHOID SURGERY     LAPAROSCOPIC  CHOLECYSTECTOMY     LAPAROSCOPIC SUPRACERVICAL HYSTERECTOMY  2008   SALPINGOOPHORECTOMY Left 2008   TONSILLECTOMY AND ADENOIDECTOMY  1960    Family History  Problem Relation Age of Onset   Diabetes Mother    Hypertension Mother    Diabetes Father    Hypertension Father    Hypertension Brother    Diabetes Paternal Grandmother    Emphysema Paternal Grandfather    Hypertension Brother     Social History   Socioeconomic History   Marital status: Married    Spouse name: Not on file   Number of children: Not on file   Years of education: Not on file   Highest education level: Not on file  Occupational History   Not on file  Tobacco Use   Smoking status: Some Days    Packs/day: 0.25    Years: 42.00    Pack years: 10.50    Types: Cigarettes   Smokeless tobacco: Never   Tobacco comments:    has a cigarette every few days  Vaping Use   Vaping Use: Never used  Substance and Sexual Activity   Alcohol use: Yes    Alcohol/week: 0.0 standard drinks    Comment: OCCASIONALLY   Drug use: No   Sexual activity: Yes    Birth control/protection: Surgical    Comment: hysterectomy   Other Topics  Concern   Not on file  Social History Narrative   Not on file   Social Determinants of Health   Financial Resource Strain: Not on file  Food Insecurity: Not on file  Transportation Needs: Not on file  Physical Activity: Not on file  Stress: Not on file  Social Connections: Not on file  Intimate Partner Violence: Not on file    Outpatient Medications Prior to Visit  Medication Sig Dispense Refill   albuterol (VENTOLIN HFA) 108 (90 Base) MCG/ACT inhaler INHALE 2 PUFFS BY MOUTH EVERY 6 HOURS AS NEEDED FOR WHEEZING OR SHORTNESS OF BREATH 8.5 g 5   aspirin EC 81 MG tablet Take 81 mg by mouth daily. Swallow whole.     benazepril (LOTENSIN) 5 MG tablet Take 1 tablet (5 mg total) by mouth daily. 90 tablet 4   clotrimazole-betamethasone (LOTRISONE) cream Apply externally BID prn sx up to 2  wks 15 g 0   cyclobenzaprine (FLEXERIL) 5 MG tablet Take 1-2 tablets (5-10 mg total) by mouth 3 (three) times daily as needed (shoulder pain). 30 tablet 1   dapagliflozin propanediol (FARXIGA) 10 MG TABS tablet Take 1 tablet (10 mg total) by mouth daily before breakfast. 90 tablet 3   fluticasone (FLONASE) 50 MCG/ACT nasal spray SPRAY 2 SPRAYS INTO EACH NOSTRIL EVERY DAY 48 mL 2   gabapentin (NEURONTIN) 300 MG capsule Take 1 capsule (300 mg total) by mouth at bedtime. 90 capsule 0   glipiZIDE (GLUCOTROL XL) 5 MG 24 hr tablet TAKE 1 TABLET BY MOUTH EVERY DAY WITH BREAKFAST 90 tablet 1   lovastatin (MEVACOR) 20 MG tablet Take 1 tablet (20 mg total) by mouth daily. 90 tablet 4   meloxicam (MOBIC) 7.5 MG tablet Take 1-2 tablets (7.5-15 mg total) by mouth daily as needed for pain. 30 tablet 4   metFORMIN (GLUCOPHAGE-XR) 500 MG 24 hr tablet TAKE 2 TABLETS BY MOUTH TWICE A DAY (Patient taking differently: Take 500 mg by mouth 2 (two) times daily with a meal.) 360 tablet 1   PARoxetine (PAXIL) 40 MG tablet Take 1 tablet (40 mg total) by mouth daily. 90 tablet 2   sitaGLIPtin (JANUVIA) 100 MG tablet Take 1 tablet (100 mg total) by mouth daily. 90 tablet 4   traZODone (DESYREL) 100 MG tablet TAKE 1/2 TO 1 TABLET BY MOUTH AT BEDTIME 90 tablet 0   venlafaxine XR (EFFEXOR-XR) 37.5 MG 24 hr capsule TAKE 1 CAPSULE BY MOUTH DAILY WITH BREAKFAST. 90 capsule 4   No facility-administered medications prior to visit.      ROS:  Review of Systems  Constitutional:  Negative for fever.  Gastrointestinal:  Negative for blood in stool, constipation, diarrhea, nausea and vomiting.  Genitourinary:  Negative for dyspareunia, dysuria, flank pain, frequency, hematuria, urgency, vaginal bleeding, vaginal discharge and vaginal pain.  Musculoskeletal:  Negative for back pain.  Skin:  Negative for rash.  BREAST: No symptoms   OBJECTIVE:   Vitals:  BP 130/90   Ht 5' (1.524 m)   Wt 118 lb (53.5 kg)   BMI 23.05 kg/m    Physical Exam Vitals reviewed.  Constitutional:      Appearance: She is well-developed.  Pulmonary:     Effort: Pulmonary effort is normal.  Genitourinary:    Pubic Area: No rash.      Labia:        Right: Rash and tenderness present. No lesion.        Left: Rash and tenderness present. No lesion.  Vagina: Normal. No vaginal discharge, erythema or tenderness.     Adnexa: Right adnexa normal and left adnexa normal.       Right: No mass or tenderness.         Left: No mass or tenderness.       Comments: BILAT LABIA MAJORA AND MINORA WITH ERYTHEMA, MILD SWELLING; ALSO WITH ERYTHEMA/IRRITATION PERINEAL AND PERIANAL AREA;  Musculoskeletal:        General: Normal range of motion.     Cervical back: Normal range of motion.  Skin:    General: Skin is warm and dry.  Neurological:     General: No focal deficit present.     Mental Status: She is alert and oriented to person, place, and time.  Psychiatric:        Mood and Affect: Mood normal.        Behavior: Behavior normal.        Thought Content: Thought content normal.        Judgment: Judgment normal.    Results: Results for orders placed or performed in visit on 11/04/20 (from the past 24 hour(s))  POCT Wet Prep with KOH     Status: Abnormal   Collection Time: 11/04/20  4:53 PM  Result Value Ref Range   Trichomonas, UA Negative    Clue Cells Wet Prep HPF POC neg    Epithelial Wet Prep HPF POC     Yeast Wet Prep HPF POC few    Bacteria Wet Prep HPF POC     RBC Wet Prep HPF POC     WBC Wet Prep HPF POC     KOH Prep POC Negative Negative     Assessment/Plan: Candidal vaginitis - Plan: fluconazole (DIFLUCAN) 150 MG tablet, POCT Wet Prep with KOH; pos sx and exam, pos wet prep. Rx diflucan for 3 doses, can cont lotrisone crm a few more days. Sx should improve once blood sugars better controlled. May need wkly diflucan as maintenance. F/u prn.  Menopausal vasomotor sx--pt already on non-hormone meds to treat. Could also  be related to uncontrolled DM. Can check with pharmacy re: safety of estroven with her other meds. F/u prn.    Meds ordered this encounter  Medications   fluconazole (DIFLUCAN) 150 MG tablet    Sig: Take 1 tablet (150 mg total) by mouth once for 1 dose. Repeat  Q3 days for 3 doses total    Dispense:  3 tablet    Refill:  0    Order Specific Question:   Supervising Provider    Answer:   Nadara Mustard [[id number]]      Return if symptoms worsen or fail to improve.  Victorian Gunn B. Junita Kubota, PA-C 11/04/2020 4:57 PM

## 2020-11-05 ENCOUNTER — Encounter: Payer: Self-pay | Admitting: Gastroenterology

## 2020-11-05 ENCOUNTER — Other Ambulatory Visit: Payer: Self-pay | Admitting: Family Medicine

## 2020-11-05 DIAGNOSIS — M797 Fibromyalgia: Secondary | ICD-10-CM

## 2020-11-14 ENCOUNTER — Encounter: Payer: Self-pay | Admitting: Gastroenterology

## 2020-11-14 ENCOUNTER — Ambulatory Visit
Admission: RE | Admit: 2020-11-14 | Discharge: 2020-11-14 | Disposition: A | Discharge status: Home / Self Care | Payer: BC Managed Care – PPO | Attending: Gastroenterology | Admitting: Gastroenterology

## 2020-11-14 ENCOUNTER — Ambulatory Visit: Payer: BC Managed Care – PPO | Admitting: Anesthesiology

## 2020-11-14 ENCOUNTER — Other Ambulatory Visit: Payer: Self-pay

## 2020-11-14 ENCOUNTER — Encounter
Admission: RE | Disposition: A | Discharge status: Home / Self Care | Payer: Self-pay | Source: Home / Self Care | Attending: Gastroenterology

## 2020-11-14 DIAGNOSIS — Z7982 Long term (current) use of aspirin: Secondary | ICD-10-CM | POA: Diagnosis not present

## 2020-11-14 DIAGNOSIS — K5730 Diverticulosis of large intestine without perforation or abscess without bleeding: Secondary | ICD-10-CM | POA: Insufficient documentation

## 2020-11-14 DIAGNOSIS — F17210 Nicotine dependence, cigarettes, uncomplicated: Secondary | ICD-10-CM | POA: Diagnosis not present

## 2020-11-14 DIAGNOSIS — Z90710 Acquired absence of both cervix and uterus: Secondary | ICD-10-CM | POA: Insufficient documentation

## 2020-11-14 DIAGNOSIS — E785 Hyperlipidemia, unspecified: Secondary | ICD-10-CM | POA: Diagnosis not present

## 2020-11-14 DIAGNOSIS — Z9049 Acquired absence of other specified parts of digestive tract: Secondary | ICD-10-CM | POA: Insufficient documentation

## 2020-11-14 DIAGNOSIS — Z7984 Long term (current) use of oral hypoglycemic drugs: Secondary | ICD-10-CM | POA: Insufficient documentation

## 2020-11-14 DIAGNOSIS — Z8249 Family history of ischemic heart disease and other diseases of the circulatory system: Secondary | ICD-10-CM | POA: Diagnosis not present

## 2020-11-14 DIAGNOSIS — Z79899 Other long term (current) drug therapy: Secondary | ICD-10-CM | POA: Diagnosis not present

## 2020-11-14 DIAGNOSIS — Z880 Allergy status to penicillin: Secondary | ICD-10-CM | POA: Diagnosis not present

## 2020-11-14 DIAGNOSIS — Z98891 History of uterine scar from previous surgery: Secondary | ICD-10-CM | POA: Diagnosis not present

## 2020-11-14 DIAGNOSIS — I10 Essential (primary) hypertension: Secondary | ICD-10-CM | POA: Diagnosis not present

## 2020-11-14 DIAGNOSIS — Z885 Allergy status to narcotic agent status: Secondary | ICD-10-CM | POA: Diagnosis not present

## 2020-11-14 DIAGNOSIS — K760 Fatty (change of) liver, not elsewhere classified: Secondary | ICD-10-CM | POA: Diagnosis not present

## 2020-11-14 DIAGNOSIS — Z888 Allergy status to other drugs, medicaments and biological substances status: Secondary | ICD-10-CM | POA: Insufficient documentation

## 2020-11-14 DIAGNOSIS — Z90721 Acquired absence of ovaries, unilateral: Secondary | ICD-10-CM | POA: Insufficient documentation

## 2020-11-14 DIAGNOSIS — E1140 Type 2 diabetes mellitus with diabetic neuropathy, unspecified: Secondary | ICD-10-CM | POA: Insufficient documentation

## 2020-11-14 DIAGNOSIS — Z833 Family history of diabetes mellitus: Secondary | ICD-10-CM | POA: Diagnosis not present

## 2020-11-14 DIAGNOSIS — K648 Other hemorrhoids: Secondary | ICD-10-CM | POA: Diagnosis not present

## 2020-11-14 DIAGNOSIS — Z1211 Encounter for screening for malignant neoplasm of colon: Secondary | ICD-10-CM | POA: Insufficient documentation

## 2020-11-14 DIAGNOSIS — Z86010 Personal history of colonic polyps: Secondary | ICD-10-CM

## 2020-11-14 DIAGNOSIS — Z8719 Personal history of other diseases of the digestive system: Secondary | ICD-10-CM | POA: Diagnosis not present

## 2020-11-14 DIAGNOSIS — R194 Change in bowel habit: Secondary | ICD-10-CM

## 2020-11-14 HISTORY — PX: COLONOSCOPY WITH PROPOFOL: SHX5780

## 2020-11-14 HISTORY — DX: Occlusion and stenosis of unspecified carotid artery: I65.29

## 2020-11-14 HISTORY — DX: Polyneuropathy, unspecified: G62.9

## 2020-11-14 LAB — GLUCOSE, CAPILLARY
Glucose-Capillary: 207 mg/dL — ABNORMAL HIGH (ref 70–99)
Glucose-Capillary: 204 mg/dL — ABNORMAL HIGH (ref 70–99)

## 2020-11-14 SURGERY — COLONOSCOPY WITH PROPOFOL
Anesthesia: General | Site: Rectum

## 2020-11-14 MED ORDER — SODIUM CHLORIDE 0.9 % IV SOLN: INTRAVENOUS | Status: DC

## 2020-11-14 MED ORDER — LIDOCAINE HCL (CARDIAC) PF 100 MG/5ML IV SOSY
PREFILLED_SYRINGE | INTRAVENOUS | Status: DC | PRN
  Administered 2020-11-14: 30 mg via INTRAVENOUS

## 2020-11-14 MED ORDER — LACTATED RINGERS IV SOLN: INTRAVENOUS | Status: DC

## 2020-11-14 MED ORDER — STERILE WATER FOR IRRIGATION IR SOLN
Status: DC | PRN
  Administered 2020-11-14: .05 mL

## 2020-11-14 MED ORDER — PROPOFOL 10 MG/ML IV BOLUS
INTRAVENOUS | Status: DC | PRN
  Administered 2020-11-14: 150 mg via INTRAVENOUS
  Administered 2020-11-14 (×2): 20 mg via INTRAVENOUS
  Administered 2020-11-14 (×2): 30 mg via INTRAVENOUS
  Administered 2020-11-14: 20 mg via INTRAVENOUS
  Administered 2020-11-14: 30 mg via INTRAVENOUS

## 2020-11-14 SURGICAL SUPPLY — 6 items
GOWN CVR UNV OPN BCK APRN NK (MISCELLANEOUS) ×2 IMPLANT
GOWN ISOL THUMB LOOP REG UNIV (MISCELLANEOUS) ×4
KIT PRC NS LF DISP ENDO (KITS) ×1 IMPLANT
KIT PROCEDURE OLYMPUS (KITS) ×2
MANIFOLD NEPTUNE II (INSTRUMENTS) ×2 IMPLANT
WATER STERILE IRR 250ML POUR (IV SOLUTION) ×2 IMPLANT

## 2020-11-14 NOTE — Anesthesia Procedure Notes (Signed)
Date/Time: 11/14/2020 7:51 AM Performed by: Maree Krabbe, CRNA Pre-anesthesia Checklist: Patient identified, Emergency Drugs available, Suction available, Timeout performed and Patient being monitored Patient Re-evaluated:Patient Re-evaluated prior to induction Oxygen Delivery Method: Nasal cannula Placement Confirmation: positive ETCO2

## 2020-11-14 NOTE — H&P (Signed)
Midge Minium, MD [facility] [address], [address] [place], [place] [address] Phone:[phone] Fax : [phone]  Primary Care Physician:  Malva Limes, MD Primary Gastroenterologist:  Dr. Servando Snare  Pre-Procedure History & Physical: HPI:  Dana Weber is a 60 y.o. female is here for an colonoscopy.   Past Medical History:  Diagnosis Date   Carotid artery stenosis    Diabetes mellitus without complication (HCC)    Fatty liver    Headache    sinus   Hyperlipidemia    Hypertension    Multilevel degenerative disc disease    "spurs" on spine also   Neuropathy    hands and feet   Vertigo    mild - daily - positional   Wears dentures    partial lower    Past Surgical History:  Procedure Laterality Date   ABDOMINAL HYSTERECTOMY     CESAREAN SECTION     COLONOSCOPY WITH PROPOFOL N/A 03/20/2015   Procedure: COLONOSCOPY WITH PROPOFOL;  Surgeon: Midge Minium, MD;  Location: [facility];  Service: Endoscopy;  Laterality: N/A;   DILATION AND CURETTAGE OF UTERUS     ESOPHAGOGASTRODUODENOSCOPY (EGD) WITH PROPOFOL N/A 03/20/2015   Procedure: ESOPHAGOGASTRODUODENOSCOPY (EGD) WITH PROPOFOL;  Surgeon: Midge Minium, MD;  Location: [facility];  Service: Endoscopy;  Laterality: N/A;  Diabetic - oral meds   HEMORRHOID SURGERY     LAPAROSCOPIC CHOLECYSTECTOMY     LAPAROSCOPIC SUPRACERVICAL HYSTERECTOMY  2008   SALPINGOOPHORECTOMY Left 2008   TONSILLECTOMY AND ADENOIDECTOMY  1960    Prior to Admission medications   Medication Sig Start Date End Date Taking? Authorizing Provider  albuterol (VENTOLIN HFA) 108 (90 Base) MCG/ACT inhaler INHALE 2 PUFFS BY MOUTH EVERY 6 HOURS AS NEEDED FOR WHEEZING OR SHORTNESS OF BREATH 07/31/20  Yes Malva Limes, MD  aspirin EC 81 MG tablet Take 81 mg by mouth daily. Swallow whole.   Yes [provider]  benazepril (LOTENSIN) 5 MG tablet Take 1 tablet (5 mg total) by mouth daily. 08/05/20 10/29/21 Yes Malva Limes, MD   CINNAMON PO Take by mouth.   Yes [provider]  clotrimazole-betamethasone (LOTRISONE) cream Apply externally BID prn sx up to 2 wks 09/18/20  Yes Copland, Alicia B, PA-C  cyclobenzaprine (FLEXERIL) 5 MG tablet Take 1-2 tablets (5-10 mg total) by mouth 3 (three) times daily as needed (shoulder pain). 01/08/19  Yes Malva Limes, MD  dapagliflozin propanediol (FARXIGA) 10 MG TABS tablet Take 1 tablet (10 mg total) by mouth daily before breakfast. 01/11/20  Yes Malva Limes, MD  fluticasone [facility]) 50 MCG/ACT nasal spray SPRAY 2 SPRAYS INTO EACH NOSTRIL EVERY DAY 06/25/20  Yes Malva Limes, MD  gabapentin (NEURONTIN) 300 MG capsule TAKE 1 CAPSULE BY MOUTH EVERYDAY AT BEDTIME 11/06/20  Yes Malva Limes, MD  glipiZIDE (GLUCOTROL XL) 5 MG 24 hr tablet TAKE 1 TABLET BY MOUTH EVERY DAY WITH BREAKFAST 10/24/20  Yes Malva Limes, MD  lovastatin (MEVACOR) 20 MG tablet Take 1 tablet (20 mg total) by mouth daily. 08/05/20 10/29/21 Yes Malva Limes, MD  meloxicam (MOBIC) 7.5 MG tablet Take 1-2 tablets (7.5-15 mg total) by mouth daily as needed for pain. 05/20/17  Yes Malva Limes, MD  metFORMIN (GLUCOPHAGE-XR) 500 MG 24 hr tablet TAKE 2 TABLETS BY MOUTH TWICE A DAY Patient taking differently: Take 500 mg by mouth 2 (two) times daily with a meal. 04/03/20  Yes Malva Limes, MD  PARoxetine (PAXIL) 40  MG tablet Take 1 tablet (40 mg total) by mouth daily. 08/13/20  Yes Malva Limes, MD  Probiotic Product (PROBIOTIC PO) Take by mouth.   Yes [provider]  sitaGLIPtin (JANUVIA) 100 MG tablet Take 1 tablet (100 mg total) by mouth daily. 02/12/20  Yes Malva Limes, MD  traZODone (DESYREL) 100 MG tablet TAKE 1/2 TO 1 TABLET BY MOUTH AT BEDTIME 08/05/20  Yes Malva Limes, MD  venlafaxine XR (EFFEXOR-XR) 37.5 MG 24 hr capsule TAKE 1 CAPSULE BY MOUTH DAILY WITH BREAKFAST. 10/13/20  Yes Malva Limes, MD    Allergies as of 09/17/2020 - Review Complete 09/17/2020   Allergen Reaction Noted   Hydrocodone Swelling 10/23/2018   Penicillins Swelling 10/23/2018   Amoxicillin Swelling 11/05/2014   Hydrocodone bitartrate er Swelling 11/05/2014   Wellbutrin [bupropion]  01/08/2019    Family History  Problem Relation Age of Onset   Diabetes Mother    Hypertension Mother    Diabetes Father    Hypertension Father    Hypertension Brother    Diabetes Paternal Grandmother    Emphysema Paternal Grandfather    Hypertension Brother     Social History   Socioeconomic History   Marital status: Married    Spouse name: Not on file   Number of children: Not on file   Years of education: Not on file   Highest education level: Not on file  Occupational History   Not on file  Tobacco Use   Smoking status: Some Days    Packs/day: 0.25    Years: 42.00    Pack years: 10.50    Types: Cigarettes   Smokeless tobacco: Never   Tobacco comments:    has a cigarette every few days  Vaping Use   Vaping Use: Former  Substance and Sexual Activity   Alcohol use: Yes    Alcohol/week: 0.0 standard drinks    Comment: OCCASIONALLY   Drug use: No   Sexual activity: Yes    Birth control/protection: Surgical    Comment: hysterectomy   Other Topics Concern   Not on file  Social History Narrative   Not on file   Social Determinants of Health   Financial Resource Strain: Not on file  Food Insecurity: Not on file  Transportation Needs: Not on file  Physical Activity: Not on file  Stress: Not on file  Social Connections: Not on file  Intimate Partner Violence: Not on file    Review of Systems: See HPI, otherwise negative ROS  Physical Exam: BP (!) 177/86   Pulse 86   Temp 98.1 F (36.7 C) (Temporal)   Resp 16   Ht 5' (1.524 m)   Wt 52.2 kg   SpO2 97%   BMI 22.46 kg/m  General:   Alert,  pleasant and cooperative in NAD Head:  Normocephalic and atraumatic. Neck:  Supple; no masses or thyromegaly. Lungs:  Clear throughout to auscultation.    Heart:   Regular rate and rhythm. Abdomen:  Soft, nontender and nondistended. Normal bowel sounds, without guarding, and without rebound.   Neurologic:  Alert and  oriented x4;  grossly normal neurologically.  Impression/Plan: Dana Weber is here for an colonoscopy to be performed for a history of adenomatous polyps on 2016   Risks, benefits, limitations, and alternatives regarding  colonoscopy have been reviewed with the patient.  Questions have been answered.  All parties agreeable.   Midge Minium, MD  11/14/2020, 7:43 AM

## 2020-11-14 NOTE — Anesthesia Preprocedure Evaluation (Signed)
Anesthesia Evaluation  Patient identified by MRN, date of birth, ID band Patient awake    Reviewed: Allergy & Precautions, NPO status , Patient's Chart, lab work & pertinent test results, reviewed documented beta blocker date and time   History of Anesthesia Complications Negative for: history of anesthetic complications  Airway Mallampati: II  TM Distance: >3 FB Neck ROM: Full    Dental  (+) Partial Lower   Pulmonary Current Smoker and Patient abstained from smoking.,    Pulmonary exam normal breath sounds clear to auscultation       Cardiovascular Exercise Tolerance: Good hypertension, + Peripheral Vascular Disease  Normal cardiovascular exam Rhythm:Regular Rate:Normal     Neuro/Psych  Headaches, PSYCHIATRIC DISORDERS Anxiety Depression    GI/Hepatic   Endo/Other  diabetes, Type 2  Renal/GU Renal disease     Musculoskeletal  (+) Arthritis ,   Abdominal Normal abdominal exam  (+) - obese,  Abdomen: soft.    Peds  Hematology   Anesthesia Other Findings   Reproductive/Obstetrics                             Anesthesia Physical  Anesthesia Plan  ASA: 3  Anesthesia Plan: General   Post-op Pain Management:    Induction: Intravenous  PONV Risk Score and Plan: 2 and Propofol infusion and Treatment may vary due to age or medical condition  Airway Management Planned: Natural Airway and Nasal Cannula  Additional Equipment:   Intra-op Plan:   Post-operative Plan:   Informed Consent: I have reviewed the patients History and Physical, chart, labs and discussed the procedure including the risks, benefits and alternatives for the proposed anesthesia with the patient or authorized representative who has indicated his/her understanding and acceptance.       Plan Discussed with: CRNA  Anesthesia Plan Comments:         Anesthesia Quick Evaluation  Patient Active Problem List    Diagnosis Date Noted  . Candidal vaginitis 11/04/2020  . Insomnia 10/24/2020  . PAD (peripheral artery disease) (HCC) 05/04/2020  . Carotid artery stenosis, asymptomatic, left 09/18/2019  . 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 Complex renal cyst 08/27/2015  . Personal history of colonic polyps   . Gastritis   . Fatty liver 12/12/2014  . Back pain, chronic 11/05/2014  . Diabetes mellitus without complication (HCC) 11/05/2014  . Arthritis of hand, degenerative 11/05/2014  . Bursitis, trochanteric 11/05/2014  . Smoking greater than 30 pack years 10/29/2008  . Avitaminosis D 11/21/2007  . HLD (hyperlipidemia) 02/15/2006  . Menopausal symptom 02/15/2006  . Clinical depression 11/23/2002  . Essential (primary) hypertension 03/22/1998  . Episodic paroxysmal anxiety disorder 03/22/1998    CBC Latest Ref Rng & Units 08/13/2020 04/16/2020 05/03/2016  WBC 3.4 - 10.8 x10E3/uL 8.6 8.1 7.5  Hemoglobin 11.1 - 15.9 g/dL 99.3 71.6 96.7  Hematocrit 34.0 - 46.6 % 44.5 42.0 41.3  Platelets 150 - 450 x10E3/uL 294 287 355   BMP Latest Ref Rng & Units 04/16/2020 05/18/2019 06/07/2018  Glucose 70 - 99 mg/dL 893(Y) 101(B) 510(C)  BUN 6 - 20 mg/dL 17 10 11   Creatinine 0.44 - 1.00 mg/dL 5.85 2.77  BUN/Creat Ratio 9 - 23 - 14 17  Sodium 135 - 145 mmol/L 136 142 142  Potassium 3.5 - 5.1 mmol/L 3.9 4.6 4.3  Chloride 98 - 111 mmol/L 98 103 102  CO2 22 - 32 mmol/L 26 23 20   Calcium 8.9 - 10.3 mg/dL  8.9 9.7 9.4    Risks and benefits of anesthesia discussed at length, patient or surrogate demonstrates understanding. Appropriately NPO. Plan to proceed with anesthesia.  Corlis Leak, MD 11/14/20

## 2020-11-14 NOTE — Transfer of Care (Signed)
Immediate Anesthesia Transfer of Care Note  Patient: Dana Weber  Procedure(s) Performed: COLONOSCOPY WITH PROPOFOL (Rectum)  Patient Location: PACU  Anesthesia Type: General  Level of Consciousness: awake, alert  and patient cooperative  Airway and Oxygen Therapy: Patient Spontanous Breathing and Patient connected to supplemental oxygen  Post-op Assessment: Post-op Vital signs reviewed, Patient's Cardiovascular Status Stable, Respiratory Function Stable, Patent Airway and No signs of Nausea or vomiting  Post-op Vital Signs: Reviewed and stable  Complications: No notable events documented.

## 2020-11-14 NOTE — Op Note (Signed)
[facility] Gastroenterology Patient Name: Dana Weber Procedure Date: 11/14/2020 7:25 AM MRN: [id number] Account #: [id number] Date of Birth: 01-21-1961 Admit Type: Outpatient Age: 60 Room: [facility] OR ROOM 01 Gender: Female Note Status: Finalized Procedure:             Colonoscopy Indications:           High risk colon cancer surveillance: Personal history                         of colonic polyps Providers:             Midge Minium MD, MD Referring MD:          Demetrios Isaacs. Sherrie Mustache, MD (Referring MD) Medicines:             Propofol per Anesthesia Complications:         No immediate complications. Procedure:             Pre-Anesthesia Assessment:                        - Prior to the procedure, a History and Physical was                         performed, and patient medications and allergies were                         reviewed. The patient's tolerance of previous                         anesthesia was also reviewed. The risks and benefits                         of the procedure and the sedation options and risks                         were discussed with the patient. All questions were                         answered, and informed consent was obtained. Prior                         Anticoagulants: The patient has taken no previous                         anticoagulant or antiplatelet agents. ASA Grade                         Assessment: II - A patient with mild systemic disease.                         After reviewing the risks and benefits, the patient                         was deemed in satisfactory condition to undergo the                         procedure.  After obtaining informed consent, the colonoscope was                         passed under direct vision. Throughout the procedure,                         the patient's blood pressure, pulse, and oxygen                         saturations were monitored continuously. The was                          introduced through the anus and advanced to the the                         cecum, identified by appendiceal orifice and ileocecal                         valve. The colonoscopy was performed without                         difficulty. The patient tolerated the procedure well.                         The quality of the bowel preparation was excellent. Findings:      The perianal and digital rectal examinations were normal.      A few small-mouthed diverticula were found in the sigmoid colon.      Non-bleeding internal hemorrhoids were found during retroflexion. The       hemorrhoids were Grade II (internal hemorrhoids that prolapse but reduce       spontaneously). Impression:            - Diverticulosis in the sigmoid colon.                        - Non-bleeding internal hemorrhoids.                        - No specimens collected. Recommendation:        - Discharge patient to home.                        - Resume previous diet.                        - Continue present medications.                        - Repeat colonoscopy in 7 years for surveillance. Procedure Code(s):     --- Professional ---                        [phone], Colonoscopy, flexible; diagnostic, including                         collection of specimen(s) by brushing or washing, when                         performed (separate procedure) Diagnosis Code(s):     --- Professional ---  Z86.010, Personal history of colonic polyps CPT copyright 2019 American Medical Association. All rights reserved. The codes documented in this report are preliminary and upon coder review may  be revised to meet current compliance requirements. Midge Minium MD, MD 11/14/2020 8:12:48 AM This report has been signed electronically. Number of Addenda: 0 Note Initiated On: 11/14/2020 7:25 AM Scope Withdrawal Time: 0 hours 9 minutes 54 seconds  Total Procedure Duration: 0 hours 13 minutes 55 seconds  Estimated  Blood Loss:  Estimated blood loss: none.      [facility]

## 2020-11-14 NOTE — Anesthesia Postprocedure Evaluation (Signed)
Anesthesia Post Note  Patient: Dana Weber  Procedure(s) Performed: COLONOSCOPY WITH PROPOFOL (Rectum)     Patient location during evaluation: PACU Anesthesia Type: General Level of consciousness: awake and alert Pain management: pain level controlled Vital Signs Assessment: post-procedure vital signs reviewed and stable Respiratory status: spontaneous breathing, nonlabored ventilation, respiratory function stable and patient connected to nasal cannula oxygen Cardiovascular status: blood pressure returned to baseline and stable Postop Assessment: no apparent nausea or vomiting Anesthetic complications: no   No notable events documented.  Edwyna Ready

## 2020-11-17 ENCOUNTER — Encounter: Payer: Self-pay | Admitting: Gastroenterology

## 2020-11-21 ENCOUNTER — Encounter: Payer: Self-pay | Admitting: Gastroenterology

## 2020-11-21 ENCOUNTER — Ambulatory Visit: Payer: BC Managed Care – PPO | Admitting: Gastroenterology

## 2020-11-21 ENCOUNTER — Other Ambulatory Visit: Payer: Self-pay

## 2020-11-21 VITALS — BP 159/91 | HR 92 | Temp 97.8°F | Ht 65.0 in | Wt 117.6 lb

## 2020-11-21 DIAGNOSIS — R198 Other specified symptoms and signs involving the digestive system and abdomen: Secondary | ICD-10-CM | POA: Diagnosis not present

## 2020-11-21 DIAGNOSIS — K644 Residual hemorrhoidal skin tags: Secondary | ICD-10-CM

## 2020-11-21 DIAGNOSIS — K640 First degree hemorrhoids: Secondary | ICD-10-CM | POA: Diagnosis not present

## 2020-11-21 NOTE — Progress Notes (Signed)
Dana Repress, MD [address]  [address]  [place], [place] [address]  Main: [phone]  Fax: [phone]    Gastroenterology Consultation  Referring Provider:     Malva Limes, MD Primary Care Physician:  Malva Limes, MD Primary Gastroenterologist:  Dr. Midge Minium Reason for Consultation: Symptomatic hemorrhoids and irregular bowel habits        HPI:   Stehanie Weber is a 60 y.o. female referred by Dr. Malva Limes, MD  for consultation & management of symptomatic hemorrhoids.  Patient has been experiencing almost 40 years of hemorrhoidal symptoms including pressure, swelling, pain, leakage, discomfort, itching.  She denies prolapse.  She does experience intermittent bleeding as well.  She has used Preparation H over the years with not much relief.  She reports that she experiences 1 or the other hemorrhoidal symptoms almost on a daily basis. She also has a cluster of skin tags which troubled her with maintaining proper hygiene.  She does report irregular bowel habits, although her stools are not hard, about once 1-2 times a week, associated with incomplete emptying.  However, she denies any pushing or straining.  She is on metformin and she thinks it is causing her diarrhea.  She does smoke  Patient is accompanied by her husband today  NSAIDs: None  Antiplts/Anticoagulants/Anti thrombotics: None  GI Procedures:  EGD and colonoscopy 03/10/2015 Diagnosis 1. Stomach, biopsy, gastric - CHRONIC GASTRITIS. - A WARTHIN-STARRY STAIN IS NEGATIVE FOR HELICOBACTER PYLORI. - NEGATIVE FOR INTESTINAL METAPLASIA OR MALIGNANCY. 2. Ileum, biopsy, terminal - BENIGN TERMINAL ILEAL MUCOSA WITH NO HISTOPATHOLOGIC ABNORMALITY. - NO ACTIVE OR CHRONIC INFLAMMATION, GRANULOMAS OR MALIGNANCY IDENTIFIED. 3. Colon, biopsy, random - BENIGN COLONIC MUCOSA WITH NO HISTOPATHOLOGIC ABNORMALITY. - NO EVIDENCE OF LYMPHOCYTIC OR COLLAGENOUS COLITIS, INFLAMMATORY BOWEL DISEASE OR  MALIGNANCY.  Colonoscopy 11/14/2020 - Diverticulosis in the sigmoid colon. - Non-bleeding internal hemorrhoids. - No specimens collected.   Past Medical History:  Diagnosis Date   Carotid artery stenosis    Diabetes mellitus without complication (HCC)    Fatty liver    Headache    sinus   Hyperlipidemia    Hypertension    Multilevel degenerative disc disease    "spurs" on spine also   Neuropathy    hands and feet   Vertigo    mild - daily - positional   Wears dentures    partial lower    Past Surgical History:  Procedure Laterality Date   ABDOMINAL HYSTERECTOMY     CESAREAN SECTION     COLONOSCOPY WITH PROPOFOL N/A 03/20/2015   Procedure: COLONOSCOPY WITH PROPOFOL;  Surgeon: Midge Minium, MD;  Location: [facility];  Service: Endoscopy;  Laterality: N/A;   COLONOSCOPY WITH PROPOFOL N/A 11/14/2020   Procedure: COLONOSCOPY WITH PROPOFOL;  Surgeon: Midge Minium, MD;  Location: [facility];  Service: Endoscopy;  Laterality: N/A;  Diabetic - oral meds   DILATION AND CURETTAGE OF UTERUS     ESOPHAGOGASTRODUODENOSCOPY (EGD) WITH PROPOFOL N/A 03/20/2015   Procedure: ESOPHAGOGASTRODUODENOSCOPY (EGD) WITH PROPOFOL;  Surgeon: Midge Minium, MD;  Location: [facility];  Service: Endoscopy;  Laterality: N/A;  Diabetic - oral meds   HEMORRHOID SURGERY     LAPAROSCOPIC CHOLECYSTECTOMY     LAPAROSCOPIC SUPRACERVICAL HYSTERECTOMY  2008   SALPINGOOPHORECTOMY Left 2008   TONSILLECTOMY AND ADENOIDECTOMY  1960    Current Outpatient Medications:    albuterol (VENTOLIN HFA) 108 (90 Base) MCG/ACT inhaler, INHALE 2 PUFFS BY MOUTH EVERY 6  HOURS AS NEEDED FOR WHEEZING OR SHORTNESS OF BREATH, Disp: 8.5 g, Rfl: 5   aspirin EC 81 MG tablet, Take 81 mg by mouth daily. Swallow whole., Disp: , Rfl:    benazepril (LOTENSIN) 5 MG tablet, Take 1 tablet (5 mg total) by mouth daily., Disp: 90 tablet, Rfl: 4   CINNAMON PO, Take by mouth., Disp: , Rfl:    clotrimazole-betamethasone  (LOTRISONE) cream, Apply externally BID prn sx up to 2 wks, Disp: 15 g, Rfl: 0   dapagliflozin propanediol (FARXIGA) 10 MG TABS tablet, Take 1 tablet (10 mg total) by mouth daily before breakfast., Disp: 90 tablet, Rfl: 3   gabapentin (NEURONTIN) 300 MG capsule, TAKE 1 CAPSULE BY MOUTH EVERYDAY AT BEDTIME, Disp: 90 capsule, Rfl: 0   glipiZIDE (GLUCOTROL XL) 5 MG 24 hr tablet, TAKE 1 TABLET BY MOUTH EVERY DAY WITH BREAKFAST, Disp: 90 tablet, Rfl: 1   lovastatin (MEVACOR) 20 MG tablet, Take 1 tablet (20 mg total) by mouth daily., Disp: 90 tablet, Rfl: 4   metFORMIN (GLUCOPHAGE-XR) 500 MG 24 hr tablet, TAKE 2 TABLETS BY MOUTH TWICE A DAY (Patient taking differently: Take 500 mg by mouth 2 (two) times daily with a meal.), Disp: 360 tablet, Rfl: 1   PARoxetine (PAXIL) 40 MG tablet, Take 1 tablet (40 mg total) by mouth daily., Disp: 90 tablet, Rfl: 2   Probiotic Product (PROBIOTIC PO), Take by mouth., Disp: , Rfl:    sitaGLIPtin (JANUVIA) 100 MG tablet, Take 1 tablet (100 mg total) by mouth daily., Disp: 90 tablet, Rfl: 4   venlafaxine XR (EFFEXOR-XR) 37.5 MG 24 hr capsule, TAKE 1 CAPSULE BY MOUTH DAILY WITH BREAKFAST., Disp: 90 capsule, Rfl: 4   cyclobenzaprine (FLEXERIL) 5 MG tablet, Take 1-2 tablets (5-10 mg total) by mouth 3 (three) times daily as needed (shoulder pain). (Patient not taking: Reported on 11/21/2020), Disp: 30 tablet, Rfl: 1   fluticasone (FLONASE) 50 MCG/ACT nasal spray, SPRAY 2 SPRAYS INTO EACH NOSTRIL EVERY DAY (Patient not taking: Reported on 11/21/2020), Disp: 48 mL, Rfl: 2   meloxicam (MOBIC) 7.5 MG tablet, Take 1-2 tablets (7.5-15 mg total) by mouth daily as needed for pain. (Patient not taking: Reported on 11/21/2020), Disp: 30 tablet, Rfl: 4   traZODone (DESYREL) 100 MG tablet, TAKE 1/2 TO 1 TABLET BY MOUTH AT BEDTIME (Patient not taking: Reported on 11/21/2020), Disp: 90 tablet, Rfl: 0    Family History  Problem Relation Age of Onset   Diabetes Mother    Hypertension Mother     Diabetes Father    Hypertension Father    Hypertension Brother    Diabetes Paternal Grandmother    Emphysema Paternal Grandfather    Hypertension Brother      Social History   Tobacco Use   Smoking status: Some Days    Packs/day: 0.25    Years: 42.00    Pack years: 10.50    Types: Cigarettes   Smokeless tobacco: Never   Tobacco comments:    has a cigarette every few days  Vaping Use   Vaping Use: Former  Substance Use Topics   Alcohol use: Yes    Alcohol/week: 0.0 standard drinks    Comment: OCCASIONALLY   Drug use: No    Allergies as of 11/21/2020 - Review Complete 11/21/2020  Allergen Reaction Noted   Hydrocodone Swelling 10/23/2018   Penicillins Swelling 10/23/2018   Amoxicillin Swelling 11/05/2014   Hydrocodone bitartrate er Swelling 11/05/2014   Wellbutrin [bupropion]  01/08/2019    Review of  Systems:    All systems reviewed and negative except where noted in HPI.   Physical Exam:  BP (!) 159/91 (BP Location: Left Arm, Patient Position: Sitting, Cuff Size: Normal)   Pulse 92   Temp 97.8 F (36.6 C)   Ht 5\' 5"  (1.651 m)   Wt 117 lb 9.6 oz (53.3 kg)   BMI 19.57 kg/m  No LMP recorded. Patient has had a hysterectomy.  General:   Alert,  Well-developed, well-nourished, pleasant and cooperative in NAD Head:  Normocephalic and atraumatic. Eyes:  Sclera clear, no icterus.   Conjunctiva pink. Ears:  Normal auditory acuity. Nose:  No deformity, discharge, or lesions. Mouth:  No deformity or lesions,oropharynx pink & moist. Neck:  Supple; no masses or thyromegaly. Lungs:  Respirations even and unlabored.  Clear throughout to auscultation.   No wheezes, crackles, or rhonchi. No acute distress. Heart:  Regular rate and rhythm; no murmurs, clicks, rubs, or gallops. Abdomen:  Normal bowel sounds. Soft, non-tender and non-distended without masses, hepatosplenomegaly or hernias noted.  No guarding or rebound tenderness.   Rectal: Large cluster of perianal skin tags,  nontender digital rectal exam, anoscopy revealed large external hemorrhoids Msk:  Symmetrical without gross deformities. Good, equal movement & strength bilaterally. Pulses:  Normal pulses noted. Extremities:  No clubbing or edema.  No cyanosis. Neurologic:  Alert and oriented x3;  grossly normal neurologically. Skin:  Intact without significant lesions or rashes. No jaundice. Psych:  Alert and cooperative. Normal mood and affect.  Imaging Studies: No abdominal imaging  Assessment and Plan:   Yanina Knupp is a 60 y.o. female with diabetes, hypertension is seen in consultation for management of grade 1 symptomatic hemorrhoids and perianal skin tags  Grade 1 symptomatic hemorrhoids Discussed about hemorrhoid ligation, the procedure, including risks and benefits Patient is willing to proceed with the ligation today, consent obtained  Irregular bowel habits Trial of low-dose Linzess 72 MCG daily, samples provided  Perianal skin tags Will refer to general surgery to evaluate for excision after hemorrhoid ligation  Follow up in 2 weeks   46, MD

## 2020-11-21 NOTE — Patient Instructions (Signed)
Please stop by the [facility] office and pick up your samples.  Please take Linzess 72 mcg capsule daily 30 minutes before breakfast. Please call us to let us know if it worked.

## 2020-12-04 ENCOUNTER — Encounter: Payer: Self-pay | Admitting: Gastroenterology

## 2020-12-04 ENCOUNTER — Ambulatory Visit: Payer: BC Managed Care – PPO | Admitting: Gastroenterology

## 2020-12-04 ENCOUNTER — Other Ambulatory Visit: Payer: Self-pay

## 2020-12-04 VITALS — BP 167/95 | HR 93 | Temp 97.0°F | Ht 65.0 in | Wt 119.4 lb

## 2020-12-04 DIAGNOSIS — K640 First degree hemorrhoids: Secondary | ICD-10-CM

## 2020-12-04 DIAGNOSIS — K5904 Chronic idiopathic constipation: Secondary | ICD-10-CM

## 2020-12-04 MED ORDER — LINACLOTIDE 72 MCG PO CAPS: 72.0000 ug | ORAL_CAPSULE | Freq: Every day | ORAL | 0 refills | Status: DC

## 2020-12-04 NOTE — Progress Notes (Signed)
PROCEDURE NOTE: The patient presents with symptomatic grade 1 hemorrhoids, unresponsive to maximal medical therapy, requesting rubber band ligation of his/her hemorrhoidal disease.  All risks, benefits and alternative forms of therapy were described and informed consent was obtained.  In the Left Lateral Decubitus position (if anoscopy is performed) anoscopic examination revealed grade 1 hemorrhoids in the all position(s).   The decision was made to band the LL internal hemorrhoid, and the [facility] O'Regan System was used to perform band ligation without complication.  Digital anorectal examination was then performed to assure proper positioning of the band, and to adjust the banded tissue as required.  The patient was discharged home without pain or other issues.  Dietary and behavioral recommendations were given and (if necessary - prescriptions were given), along with follow-up instructions.  The patient will return 2 weeks for follow-up and possible additional banding as required.  No complications were encountered and the patient tolerated the procedure well.   

## 2020-12-04 NOTE — Addendum Note (Signed)
Addended by: Arlyss Repress on: 12/04/2020 03:41 PM   Modules accepted: Level of Service

## 2020-12-04 NOTE — Progress Notes (Signed)

## 2020-12-05 ENCOUNTER — Ambulatory Visit: Payer: BC Managed Care – PPO | Admitting: Gastroenterology

## 2020-12-16 ENCOUNTER — Telehealth: Payer: Self-pay | Admitting: Gastroenterology

## 2020-12-16 NOTE — Telephone Encounter (Signed)
Called and left a message for call back  

## 2020-12-16 NOTE — Telephone Encounter (Signed)
Pt. Has banding appt on Friday but she says she can not leave work they are short staffed. She is aware that there are no openings for the other dates she wants. She would like a call back to discuss another option for her banding.  She says she is at work and can she be called after 2

## 2020-12-16 NOTE — Telephone Encounter (Signed)
Patient can not get off work on Friday. Moved appointment to [facility] on 12/25/2020

## 2020-12-19 ENCOUNTER — Ambulatory Visit: Payer: BC Managed Care – PPO | Admitting: Gastroenterology

## 2020-12-23 ENCOUNTER — Other Ambulatory Visit: Payer: Self-pay | Admitting: Family Medicine

## 2020-12-23 DIAGNOSIS — J069 Acute upper respiratory infection, unspecified: Secondary | ICD-10-CM

## 2020-12-25 ENCOUNTER — Other Ambulatory Visit: Payer: Self-pay

## 2020-12-25 ENCOUNTER — Encounter: Payer: Self-pay | Admitting: Gastroenterology

## 2020-12-25 ENCOUNTER — Ambulatory Visit: Payer: BC Managed Care – PPO | Admitting: Gastroenterology

## 2020-12-25 VITALS — BP 166/82 | HR 86 | Temp 98.4°F | Ht 65.0 in | Wt 117.8 lb

## 2020-12-25 DIAGNOSIS — N9089 Other specified noninflammatory disorders of vulva and perineum: Secondary | ICD-10-CM

## 2020-12-25 DIAGNOSIS — K640 First degree hemorrhoids: Secondary | ICD-10-CM

## 2020-12-25 DIAGNOSIS — K5904 Chronic idiopathic constipation: Secondary | ICD-10-CM | POA: Diagnosis not present

## 2020-12-25 DIAGNOSIS — K644 Residual hemorrhoidal skin tags: Secondary | ICD-10-CM | POA: Diagnosis not present

## 2020-12-25 MED ORDER — METFORMIN HCL ER 500 MG PO TB24: 500.0000 mg | ORAL_TABLET | Freq: Two times a day (BID) | ORAL | Status: DC

## 2020-12-25 NOTE — Progress Notes (Signed)
PROCEDURE NOTE: The patient presents with symptomatic grade 1 hemorrhoids, unresponsive to maximal medical therapy, requesting rubber band ligation of his/her hemorrhoidal disease.  All risks, benefits and alternative forms of therapy were described and informed consent was obtained.  The decision was made to band the RA internal hemorrhoid, and the [facility] O'Regan System was used to perform band ligation without complication.  Digital anorectal examination was then performed to assure proper positioning of the band, and to adjust the banded tissue as required.  The patient was discharged home without pain or other issues.  Dietary and behavioral recommendations were given and (if necessary - prescriptions were given), along with follow-up instructions.  The patient will return 2 weeks for follow-up and possible additional banding as required.  No complications were encountered and the patient tolerated the procedure well.  Referral to colorectal surgery at [facility] surgery to evaluate for excision of perianal skin tags  Patient is on Linzess 72 MCG daily, still having irregular bowel habits but not associated with significant straining like before.  She would like to add MiraLAX once a day which is okay with me  Arlyss Repress, MD [address]  [address]  [place], [place] [address]  Main: [phone]  Fax: [phone] Pager: [phone]

## 2020-12-25 NOTE — Patient Instructions (Signed)
We will place referral they will call to set up appointment.

## 2021-01-11 ENCOUNTER — Other Ambulatory Visit: Payer: Self-pay | Admitting: Family Medicine

## 2021-01-11 DIAGNOSIS — E119 Type 2 diabetes mellitus without complications: Secondary | ICD-10-CM

## 2021-02-24 ENCOUNTER — Encounter: Payer: Self-pay | Admitting: Family Medicine

## 2021-02-24 ENCOUNTER — Ambulatory Visit: Payer: BC Managed Care – PPO | Admitting: Family Medicine

## 2021-02-24 ENCOUNTER — Other Ambulatory Visit: Payer: Self-pay

## 2021-02-24 VITALS — BP 130/80 | HR 82 | Temp 98.7°F | Wt 119.0 lb

## 2021-02-24 DIAGNOSIS — I10 Essential (primary) hypertension: Secondary | ICD-10-CM

## 2021-02-24 DIAGNOSIS — E785 Hyperlipidemia, unspecified: Secondary | ICD-10-CM | POA: Diagnosis not present

## 2021-02-24 DIAGNOSIS — E119 Type 2 diabetes mellitus without complications: Secondary | ICD-10-CM

## 2021-02-24 DIAGNOSIS — R5383 Other fatigue: Secondary | ICD-10-CM

## 2021-02-24 DIAGNOSIS — F3289 Other specified depressive episodes: Secondary | ICD-10-CM

## 2021-02-24 DIAGNOSIS — E538 Deficiency of other specified B group vitamins: Secondary | ICD-10-CM

## 2021-02-24 DIAGNOSIS — E559 Vitamin D deficiency, unspecified: Secondary | ICD-10-CM

## 2021-02-24 DIAGNOSIS — M791 Myalgia, unspecified site: Secondary | ICD-10-CM

## 2021-02-24 LAB — POCT GLYCOSYLATED HEMOGLOBIN (HGB A1C): Hemoglobin A1C: 8 % — AB (ref 4.0–5.6)

## 2021-02-24 NOTE — Patient Instructions (Addendum)
Please review the attached list of medications and notify my office if there are any errors.   Please go to the lab draw station in [address] on the second floor of [facility]  when you are fasting for 8 hours. Normal hours are 8:00am to 11:30am and 1:00pm to 4:00pm Monday through Friday   If your A1c does not keep coming down, we can change Januvia to Rybelsus, which should drop the A1c another 1.0 point

## 2021-02-24 NOTE — Progress Notes (Signed)
Established patient visit   Patient: Dana Weber   DOB: 1960/06/28   60 y.o. Female  MRN: [id number] Visit Date: 02/24/2021  Today's healthcare provider: Lelon Huh, MD   Chief Complaint  Patient presents with   Diabetes   Subjective    HPI  Diabetes Mellitus Type II, Follow-up  Lab Results  Component Value Date   HGBA1C 8.4 (A) 10/24/2020   HGBA1C 11.8 (A) 08/13/2020   HGBA1C 7.9 (A) 09/11/2019   Wt Readings from Last 3 Encounters:  12/25/20 117 lb 12.8 oz (53.4 kg)  12/04/20 119 lb 6.4 oz (54.2 kg)  11/21/20 117 lb 9.6 oz (53.3 kg)   Last seen for diabetes 4 months ago.  Management since then includes continuing same medication and continuing to work on improving diet She reports excellent compliance with treatment. She is not having side effects.  Symptoms: No fatigue No foot ulcerations  No appetite changes No nausea  Yes paresthesia of the feet  Yes polydipsia  No polyuria No visual disturbances   No vomiting     Home blood sugar records: fasting range: low to mid 100's  Episodes of hypoglycemia? Yes occasionally   Current insulin regiment: None Most Recent Eye Exam: 05/27/2020 Current diet habits: in general, a "healthy" diet    Pertinent Labs: Lab Results  Component Value Date   CHOL 191 08/13/2020   HDL 49 08/13/2020   LDLCALC 105 (H) 08/13/2020   TRIG 217 (H) 08/13/2020   CHOLHDL 3.9 08/13/2020   Lab Results  Component Value Date   NA 136 04/16/2020   K 3.9 04/16/2020   CREATININE 0.53 04/16/2020   GFRNONAA >60 04/16/2020   MICROALBUR 20 03/13/2018     ---------------------------------------------------------------------------------------------------   Follow up for insomnia:  The patient was last seen for this 4 months ago. Changes made at last visit include encouraging patient to take a full tablet of Trazodone instead  if 1/2 tablet.  She reports fair compliance with treatment, pt is only taking 1/4 - 1/2 tab of  Trazodone which remains effective.   Was also prescribed venlafaxine at least visit to help more with depression, but she stopped after about 6 weeks because she gained 10 pounds and it wasn't helping. She is still taking paroxetine which helps with anxiety. She remains very fatigued and increasing achy in muscles.  -----------------------------------------------------------------------------------------     Medications: Outpatient Medications Prior to Visit  Medication Sig   albuterol (VENTOLIN HFA) 108 (90 Base) MCG/ACT inhaler INHALE 2 PUFFS BY MOUTH EVERY 6 HOURS AS NEEDED FOR WHEEZING OR SHORTNESS OF BREATH   aspirin EC 81 MG tablet Take 81 mg by mouth daily. Swallow whole.   benazepril (LOTENSIN) 5 MG tablet Take 1 tablet (5 mg total) by mouth daily.   CINNAMON PO Take by mouth.   clotrimazole-betamethasone (LOTRISONE) cream Apply externally BID prn sx up to 2 wks   cyclobenzaprine (FLEXERIL) 5 MG tablet Take 1-2 tablets (5-10 mg total) by mouth 3 (three) times daily as needed (shoulder pain).   FARXIGA 10 MG TABS tablet TAKE 1 TABLET(10 MG) BY MOUTH DAILY BEFORE BREAKFAST   fluticasone (FLONASE) 50 MCG/ACT nasal spray SPRAY 2 SPRAYS INTO EACH NOSTRIL EVERY DAY   gabapentin (NEURONTIN) 300 MG capsule TAKE 1 CAPSULE BY MOUTH EVERYDAY AT BEDTIME   glipiZIDE (GLUCOTROL XL) 5 MG 24 hr tablet TAKE 1 TABLET BY MOUTH EVERY DAY WITH BREAKFAST   linaclotide (LINZESS) 72 MCG capsule Take 1 capsule (72 mcg total)  by mouth daily before breakfast.   lovastatin (MEVACOR) 20 MG tablet Take 1 tablet (20 mg total) by mouth daily.   meloxicam (MOBIC) 7.5 MG tablet Take 1-2 tablets (7.5-15 mg total) by mouth daily as needed for pain.   metFORMIN (GLUCOPHAGE-XR) 500 MG 24 hr tablet Take 1 tablet (500 mg total) by mouth 2 (two) times daily with a meal.   PARoxetine (PAXIL) 40 MG tablet Take 1 tablet (40 mg total) by mouth daily.   Probiotic Product (PROBIOTIC PO) Take by mouth.   sitaGLIPtin (JANUVIA) 100  MG tablet Take 1 tablet (100 mg total) by mouth daily.   traZODone (DESYREL) 100 MG tablet TAKE 1/2 TO 1 TABLET BY MOUTH AT BEDTIME   No facility-administered medications prior to visit.    Review of Systems  Constitutional: Negative.   Respiratory: Negative.    Cardiovascular:  Positive for leg swelling. Negative for chest pain and palpitations.  Gastrointestinal:  Positive for diarrhea. Negative for abdominal distention, abdominal pain, anal bleeding, blood in stool, constipation, nausea, rectal pain and vomiting.  Skin:  Negative for wound.  Neurological:  Positive for numbness.  Psychiatric/Behavioral:  Positive for sleep disturbance. Negative for agitation, behavioral problems, confusion, decreased concentration, dysphoric mood, hallucinations, self-injury and suicidal ideas. The patient is not nervous/anxious and is not hyperactive.       Objective    BP 130/80   Pulse 82   Temp 98.7 F (37.1 C) (Oral)   Wt 119 lb (54 kg)   SpO2 98%   BMI 19.80 kg/m    Physical Exam  General appearance: Thin female, cooperative and in no acute distress Head: Normocephalic, without obvious abnormality, atraumatic Respiratory: Respirations even and unlabored, normal respiratory rate Extremities: All extremities are intact.  Skin: Skin color, texture, turgor normal. No rashes seen  Psych: Appropriate mood and affect. Neurologic: Mental status: Alert, oriented to person, place, and time, thought content appropriate.   Results for orders placed or performed in visit on 02/24/21  POCT glycosylated hemoglobin (Hb A1C)  Result Value Ref Range   Hemoglobin A1C 8.0 (A) 4.0 - 5.6 %    Assessment & Plan     1. Diabetes mellitus without complication ([place]) Improving, but not too goal. Discussed changing Rybelsus to Januvia if not continuing to improve at follow up in about 4 months.   2. Hyperlipidemia, unspecified hyperlipidemia type She is tolerating lovastatin well with no adverse effects.    - Lipid panel  3. B12 deficiency  - Vitamin B12  4. Other fatigue  - CBC - Comprehensive metabolic panel  5. Avitaminosis D  - VITAMIN D 25 Hydroxy (Vit-D Deficiency, Fractures)  6. Essential (primary) hypertension Well controlled.  Continue current medications.    7. Myalgia  - Sed Rate (ESR) - CK; Future - CK   8. Depression She stopped venlafaxine since it wasn't helping and she didn't like weight gain, although she would still be at a healthy weight. She doesn't want to try any medications that might cause her to gain weight. She tried bupropion in the past which she didn't tolerate. Paroxetine is working fairly well for anxiety so am reluctant to change to a different SSRI. Will continue same dose for now.   Future Appointments  Date Time Provider [place]  04/30/2021  2:30 PM [facility] 3 [facility] None  04/30/2021  3:30 PM [facility] 3 [facility] None  04/30/2021  4:00 PM Schnier, Dolores [name], MD [facility] None  07/10/2021 11:00 AM Birdie Sons,  MD [facility]        The entirety of the information documented in the History of Present Illness, Review of Systems and Physical Exam were personally obtained by me. Portions of this information were initially documented by the CMA and reviewed by me for thoroughness and accuracy.     Lelon Huh, MD  [facility] [phone] (phone) [phone] (fax)  [place]

## 2021-04-07 LAB — CBC
Hematocrit: 46.6 % (ref 34.0–46.6)
Hemoglobin: 16.1 g/dL — ABNORMAL HIGH (ref 11.1–15.9)
MCH: 31.4 pg (ref 26.6–33.0)
MCHC: 34.5 g/dL (ref 31.5–35.7)
MCV: 91 fL (ref 79–97)
Platelets: 300 10*3/uL (ref 150–450)
RBC: 5.12 x10E6/uL (ref 3.77–5.28)
RDW: 11.4 % — ABNORMAL LOW (ref 11.7–15.4)
WBC: 6.2 10*3/uL (ref 3.4–10.8)

## 2021-04-07 LAB — COMPREHENSIVE METABOLIC PANEL WITH GFR
ALT: 27 [IU]/L (ref 0–32)
AST: 18 [IU]/L (ref 0–40)
Albumin/Globulin Ratio: 2.3 — ABNORMAL HIGH (ref 1.2–2.2)
Albumin: 4.6 g/dL (ref 3.8–4.9)
Alkaline Phosphatase: 84 [IU]/L (ref 44–121)
BUN/Creatinine Ratio: 17 (ref 9–23)
BUN: 11 mg/dL (ref 6–24)
Bilirubin Total: 0.3 mg/dL (ref 0.0–1.2)
CO2: 25 mmol/L (ref 20–29)
Calcium: 9.6 mg/dL (ref 8.7–10.2)
Chloride: 103 mmol/L (ref 96–106)
Creatinine, Ser: 0.64 mg/dL (ref 0.57–1.00)
Globulin, Total: 2 g/dL (ref 1.5–4.5)
Glucose: 210 mg/dL — ABNORMAL HIGH (ref 70–99)
Potassium: 5 mmol/L (ref 3.5–5.2)
Sodium: 143 mmol/L (ref 134–144)
Total Protein: 6.6 g/dL (ref 6.0–8.5)
eGFR: 102 mL/min/{1.73_m2}

## 2021-04-07 LAB — LIPID PANEL
Chol/HDL Ratio: 3.5 ratio (ref 0.0–4.4)
Cholesterol, Total: 165 mg/dL (ref 100–199)
HDL: 47 mg/dL (ref >39)
LDL Chol Calc (NIH): 102 mg/dL — ABNORMAL HIGH (ref 0–99)
Triglycerides: 87 mg/dL (ref 0–149)
VLDL Cholesterol Cal: 16 mg/dL (ref 5–40)

## 2021-04-07 LAB — SEDIMENTATION RATE: Sed Rate: 2 mm/hr (ref 0–40)

## 2021-04-07 LAB — VITAMIN B12: Vitamin B-12: 661 pg/mL (ref 232–1245)

## 2021-04-07 LAB — VITAMIN D 25 HYDROXY (VIT D DEFICIENCY, FRACTURES): Vit D, 25-Hydroxy: 19.4 ng/mL — ABNORMAL LOW (ref 30.0–100.0)

## 2021-04-09 ENCOUNTER — Telehealth: Payer: Self-pay | Admitting: Acute Care

## 2021-04-09 NOTE — Telephone Encounter (Signed)
Left message for pt to call back to schedule f/u lung screening CT scan.  ?

## 2021-04-10 ENCOUNTER — Other Ambulatory Visit: Payer: Self-pay | Admitting: *Deleted

## 2021-04-10 DIAGNOSIS — F17210 Nicotine dependence, cigarettes, uncomplicated: Secondary | ICD-10-CM

## 2021-04-10 DIAGNOSIS — Z87891 Personal history of nicotine dependence: Secondary | ICD-10-CM

## 2021-04-22 ENCOUNTER — Other Ambulatory Visit: Payer: Self-pay

## 2021-04-22 ENCOUNTER — Ambulatory Visit
Admission: RE | Admit: 2021-04-22 | Discharge: 2021-04-22 | Disposition: A | Discharge status: Home / Self Care | Payer: BC Managed Care – PPO | Source: Ambulatory Visit | Attending: Acute Care | Admitting: Acute Care

## 2021-04-22 DIAGNOSIS — F17210 Nicotine dependence, cigarettes, uncomplicated: Secondary | ICD-10-CM | POA: Insufficient documentation

## 2021-04-22 DIAGNOSIS — Z87891 Personal history of nicotine dependence: Secondary | ICD-10-CM | POA: Diagnosis present

## 2021-04-24 ENCOUNTER — Encounter: Payer: Self-pay | Admitting: Family Medicine

## 2021-04-24 ENCOUNTER — Other Ambulatory Visit: Payer: Self-pay

## 2021-04-24 DIAGNOSIS — F17210 Nicotine dependence, cigarettes, uncomplicated: Secondary | ICD-10-CM

## 2021-04-24 DIAGNOSIS — Z87891 Personal history of nicotine dependence: Secondary | ICD-10-CM

## 2021-04-27 NOTE — Progress Notes (Deleted)
MRN : [id number]  Dana Weber is a 61 y.o. ([birth year]) female who presents with chief complaint of check circulation.  History of Present Illness:    The patient is seen for evaluation of carotid stenosis. The carotid stenosis was identified after duplex ultrasound was obtained for a left carotid bruit.   The patient denies amaurosis fugax. There is no recent history of TIA symptoms or focal motor deficits. There is no prior documented CVA.   There is no history of migraine headaches. There is no history of seizures.   The patient is taking enteric-coated aspirin 81 mg daily.   The patient has a history of coronary artery disease, no recent episodes of angina or shortness of breath. The patient denies PAD or claudication symptoms. There is a history of hyperlipidemia which is being treated with a statin.    Duplex ultrasound of the carotid arteries obtained today shows <40% RICA and 40-59% LICA  Antegrade vertebral arteries bilaterally  No outpatient medications have been marked as taking for the 04/30/21 encounter (Appointment) with Gilda Crease, Latina Craver, MD.    Past Medical History:  Diagnosis Date   Carotid artery stenosis    Diabetes mellitus without complication (HCC)    Fatty liver    Headache    sinus   Hyperlipidemia    Hypertension    Multilevel degenerative disc disease    "spurs" on spine also   Neuropathy    hands and feet   Vertigo    mild - daily - positional   Wears dentures    partial lower    Past Surgical History:  Procedure Laterality Date   ABDOMINAL HYSTERECTOMY     CESAREAN SECTION     COLONOSCOPY WITH PROPOFOL N/A 03/20/2015   Procedure: COLONOSCOPY WITH PROPOFOL;  Surgeon: Midge Minium, MD;  Location: [facility];  Service: Endoscopy;  Laterality: N/A;   COLONOSCOPY WITH PROPOFOL N/A 11/14/2020   Procedure: COLONOSCOPY WITH PROPOFOL;  Surgeon: Midge Minium, MD;  Location: [facility];  Service: Endoscopy;  Laterality: N/A;   Diabetic - oral meds   DILATION AND CURETTAGE OF UTERUS     ESOPHAGOGASTRODUODENOSCOPY (EGD) WITH PROPOFOL N/A 03/20/2015   Procedure: ESOPHAGOGASTRODUODENOSCOPY (EGD) WITH PROPOFOL;  Surgeon: Midge Minium, MD;  Location: [facility];  Service: Endoscopy;  Laterality: N/A;  Diabetic - oral meds   HEMORRHOID SURGERY     LAPAROSCOPIC CHOLECYSTECTOMY     LAPAROSCOPIC SUPRACERVICAL HYSTERECTOMY  2008   SALPINGOOPHORECTOMY Left 2008   TONSILLECTOMY AND ADENOIDECTOMY  1960    Social History Social History   Tobacco Use   Smoking status: Some Days    Packs/day: 0.25    Years: 42.00    Pack years: 10.50    Types: Cigarettes   Smokeless tobacco: Never   Tobacco comments:    has a cigarette every few days  Vaping Use   Vaping Use: Former  Substance Use Topics   Alcohol use: Yes    Alcohol/week: 0.0 standard drinks    Comment: OCCASIONALLY   Drug use: No    Family History Family History  Problem Relation Age of Onset   Diabetes Mother    Hypertension Mother    Diabetes Father    Hypertension Father    Hypertension Brother    Diabetes Paternal Grandmother    Emphysema Paternal Grandfather    Hypertension Brother     Allergies  Allergen Reactions   Hydrocodone Swelling   Penicillins Swelling   Amoxicillin Swelling  Facial, and itching   Hydrocodone Bitartrate Er Swelling   Wellbutrin [Bupropion]     Makes her mean     REVIEW OF SYSTEMS (Negative unless checked)  Constitutional: [] Weight loss  [] Fever  [] Chills Cardiac: [] Chest pain   [] Chest pressure   [] Palpitations   [] Shortness of breath when laying flat   [] Shortness of breath with exertion. Vascular:  [] Pain in legs with walking   [] Pain in legs at rest  [] History of DVT   [] Phlebitis   [] Swelling in legs   [] Varicose veins   [] Non-healing ulcers Pulmonary:   [] Uses home oxygen   [] Productive cough   [] Hemoptysis   [] Wheeze  [] COPD   [] Asthma Neurologic:  [] Dizziness   [] Seizures   [] History of stroke    [] History of TIA  [] Aphasia   [] Vissual changes   [] Weakness or numbness in arm   [] Weakness or numbness in leg Musculoskeletal:   [] Joint swelling   [] Joint pain   [] Low back pain Hematologic:  [] Easy bruising  [] Easy bleeding   [] Hypercoagulable state   [] Anemic Gastrointestinal:  [] Diarrhea   [] Vomiting  [] Gastroesophageal reflux/heartburn   [] Difficulty swallowing. Genitourinary:  [] Chronic kidney disease   [] Difficult urination  [] Frequent urination   [] Blood in urine Skin:  [] Rashes   [] Ulcers  Psychological:  [] History of anxiety   []  History of major depression.  Physical Examination  There were no vitals filed for this visit. There is no height or weight on file to calculate BMI. Gen: WD/WN, NAD Head: [place]/AT, No temporalis wasting.  Ear/Nose/Throat: Hearing grossly intact, nares w/o erythema or drainage Eyes: PER, EOMI, sclera nonicteric.  Neck: Supple, no masses.  No bruit or JVD.  Pulmonary:  Good air movement, no audible wheezing, no use of accessory muscles.  Cardiac: RRR, normal S1, S2, no Murmurs. Vascular:  *** Vessel Right Left  Radial Palpable Palpable  Carotid Palpable Palpable  PT Palpable Palpable  DP Palpable Palpable  Gastrointestinal: soft, non-distended. No guarding/no peritoneal signs.  Musculoskeletal: M/S 5/5 throughout.  No visible deformity.  Neurologic: CN 2-12 intact. Pain and light touch intact in extremities.  Symmetrical.  Speech is fluent. Motor exam as listed above. Psychiatric: Judgment intact, Mood & affect appropriate for pt's clinical situation. Dermatologic: No rashes or ulcers noted.  No changes consistent with cellulitis.   CBC Lab Results  Component Value Date   WBC 6.2 04/06/2021   HGB 16.1 (H) 04/06/2021   HCT 46.6 04/06/2021   MCV 91 04/06/2021   PLT 300 04/06/2021    BMET    Component Value Date/Time   NA 143 04/06/2021 0844   K 5.0 04/06/2021 0844   CL 103 04/06/2021 0844   CO2 25 04/06/2021 0844   GLUCOSE 210 (H)  04/06/2021 0844   GLUCOSE 207 (H) 04/16/2020 2156   BUN 11 04/06/2021 0844   CREATININE 0.64 04/06/2021 0844   CALCIUM 9.6 04/06/2021 0844   GFRNONAA >60 04/16/2020 2156   GFRAA 106 05/18/2019 0828   CrCl cannot be calculated (Patient's most recent lab result is older than the maximum 21 days allowed.).  COAG No results found for: INR, PROTIME  Radiology CT CHEST LUNG CA SCREEN LOW DOSE W/O CM  Result Date: 04/24/2021 CLINICAL DATA:  Current smoker, 87 pack-year history. EXAM: CT CHEST WITHOUT CONTRAST LOW-DOSE FOR LUNG CANCER SCREENING TECHNIQUE: Multidetector CT imaging of the chest was performed following the standard protocol without IV contrast. RADIATION DOSE REDUCTION: This exam was performed according to the departmental dose-optimization program which includes  automated exposure control, adjustment of the mA and/or kV according to patient size and/or use of iterative reconstruction technique. COMPARISON:  10/11/2019. FINDINGS: Cardiovascular: Atherosclerotic calcification of the aorta and aortic valve. Heart size normal. No pericardial effusion. Mediastinum/Nodes: No pathologically enlarged mediastinal or axillary lymph nodes. Hilar regions are difficult to definitively evaluate without IV contrast. Esophagus is grossly unremarkable. Lungs/Pleura: Centrilobular and paraseptal emphysema. Smoking related respiratory bronchiolitis. Pulmonary nodules measure 3.5 mm or less in size, as before. No new pulmonary nodules. No pleural fluid. Airway is unremarkable. Upper Abdomen: Visualized portions of the liver, adrenal glands, kidneys, spleen, pancreas, stomach and bowel are grossly unremarkable. Cholecystectomy. Musculoskeletal: No worrisome lytic or sclerotic lesions. IMPRESSION: 1. Lung-RADS 2, benign appearance or behavior. Continue annual screening with low-dose chest CT without contrast in 12 months. 2. Hypodense lesion seen in the interpolar left kidney on comparison exam is not visualized are  not imaged. 3.  Aortic atherosclerosis (ICD10-I70.0). 4.  Emphysema (ICD10-J43.9). Electronically Signed   By: Leanna Battles M.D.   On: 04/24/2021 13:23    Assessment/Plan There are no diagnoses linked to this encounter.   Levora Dredge, MD  04/27/2021 1:12 PM

## 2021-04-30 ENCOUNTER — Ambulatory Visit (INDEPENDENT_AMBULATORY_CARE_PROVIDER_SITE_OTHER): Payer: BC Managed Care – PPO

## 2021-04-30 ENCOUNTER — Ambulatory Visit (INDEPENDENT_AMBULATORY_CARE_PROVIDER_SITE_OTHER): Payer: BC Managed Care – PPO | Admitting: Vascular Surgery

## 2021-04-30 ENCOUNTER — Other Ambulatory Visit: Payer: Self-pay

## 2021-04-30 DIAGNOSIS — I10 Essential (primary) hypertension: Secondary | ICD-10-CM

## 2021-04-30 DIAGNOSIS — I739 Peripheral vascular disease, unspecified: Secondary | ICD-10-CM | POA: Diagnosis not present

## 2021-04-30 DIAGNOSIS — I6522 Occlusion and stenosis of left carotid artery: Secondary | ICD-10-CM

## 2021-04-30 DIAGNOSIS — E119 Type 2 diabetes mellitus without complications: Secondary | ICD-10-CM

## 2021-04-30 DIAGNOSIS — E785 Hyperlipidemia, unspecified: Secondary | ICD-10-CM

## 2021-05-04 ENCOUNTER — Other Ambulatory Visit: Payer: Self-pay | Admitting: Family Medicine

## 2021-05-04 DIAGNOSIS — E119 Type 2 diabetes mellitus without complications: Secondary | ICD-10-CM

## 2021-05-07 ENCOUNTER — Ambulatory Visit (INDEPENDENT_AMBULATORY_CARE_PROVIDER_SITE_OTHER): Payer: BC Managed Care – PPO | Admitting: Vascular Surgery

## 2021-05-07 ENCOUNTER — Other Ambulatory Visit: Payer: Self-pay | Admitting: Family Medicine

## 2021-05-07 DIAGNOSIS — E119 Type 2 diabetes mellitus without complications: Secondary | ICD-10-CM

## 2021-05-11 ENCOUNTER — Ambulatory Visit (INDEPENDENT_AMBULATORY_CARE_PROVIDER_SITE_OTHER): Payer: BC Managed Care – PPO | Admitting: Vascular Surgery

## 2021-05-11 ENCOUNTER — Encounter (INDEPENDENT_AMBULATORY_CARE_PROVIDER_SITE_OTHER): Payer: Self-pay | Admitting: Vascular Surgery

## 2021-05-11 ENCOUNTER — Other Ambulatory Visit: Payer: Self-pay

## 2021-05-11 VITALS — BP 185/90 | HR 80 | Ht 60.0 in | Wt 120.0 lb

## 2021-05-11 DIAGNOSIS — I10 Essential (primary) hypertension: Secondary | ICD-10-CM

## 2021-05-11 DIAGNOSIS — I6522 Occlusion and stenosis of left carotid artery: Secondary | ICD-10-CM | POA: Diagnosis not present

## 2021-05-11 DIAGNOSIS — E119 Type 2 diabetes mellitus without complications: Secondary | ICD-10-CM

## 2021-05-11 DIAGNOSIS — E785 Hyperlipidemia, unspecified: Secondary | ICD-10-CM

## 2021-05-11 DIAGNOSIS — I739 Peripheral vascular disease, unspecified: Secondary | ICD-10-CM | POA: Diagnosis not present

## 2021-05-15 ENCOUNTER — Other Ambulatory Visit: Payer: Self-pay | Admitting: Family Medicine

## 2021-05-15 DIAGNOSIS — J069 Acute upper respiratory infection, unspecified: Secondary | ICD-10-CM

## 2021-05-17 ENCOUNTER — Encounter (INDEPENDENT_AMBULATORY_CARE_PROVIDER_SITE_OTHER): Payer: Self-pay | Admitting: Vascular Surgery

## 2021-05-17 NOTE — Progress Notes (Signed)
MRN : [id number]  Dana Weber is a 61 y.o. (December 02, 1960) female who presents with chief complaint of check carotid arteries.  History of Present Illness:   The patient is seen for evaluation of carotid stenosis. The carotid stenosis was identified after duplex ultrasound was obtained for a left carotid bruit.   The patient denies amaurosis fugax. There is no recent history of TIA symptoms or focal motor deficits. There is no prior documented CVA.   There is no history of migraine headaches. There is no history of seizures.  The patient is also followed for PAD. There have been no interval changes in lower extremity symptoms. No interval shortening of the patient's claudication distance or development of rest pain symptoms. No new ulcers or wounds have occurred since the last visit.  There have been no significant changes to the patient's overall health care.  The patient is taking enteric-coated aspirin 81 mg daily.   The patient has a history of coronary artery disease, no recent episodes of angina or shortness of breath.  There is a history of hyperlipidemia which is being treated with a statin.    Duplex ultrasound of the carotid arteries obtained today shows <40% RICA and 40-59% LICA  Antegrade vertebral arteries bilaterally  ABI Rt=0.99 and Lt=1.05    Current Meds  Medication Sig   aspirin EC 81 MG tablet Take 81 mg by mouth daily. Swallow whole.   benazepril (LOTENSIN) 5 MG tablet Take 1 tablet (5 mg total) by mouth daily.   CINNAMON PO Take by mouth.   clotrimazole-betamethasone (LOTRISONE) cream Apply externally BID prn sx up to 2 wks   FARXIGA 10 MG TABS tablet TAKE 1 TABLET(10 MG) BY MOUTH DAILY BEFORE BREAKFAST   fluticasone (FLONASE) 50 MCG/ACT nasal spray SPRAY 2 SPRAYS INTO EACH NOSTRIL EVERY DAY   glipiZIDE (GLUCOTROL XL) 5 MG 24 hr tablet TAKE 1 TABLET BY MOUTH EVERY DAY WITH BREAKFAST   JANUVIA 100 MG tablet TAKE 1 TABLET(100 MG) BY MOUTH DAILY    lovastatin (MEVACOR) 20 MG tablet Take 1 tablet (20 mg total) by mouth daily.   meloxicam (MOBIC) 7.5 MG tablet Take 1-2 tablets (7.5-15 mg total) by mouth daily as needed for pain.   metFORMIN (GLUCOPHAGE-XR) 500 MG 24 hr tablet Take 1 tablet (500 mg total) by mouth 2 (two) times daily with a meal.   PARoxetine (PAXIL) 40 MG tablet Take 1 tablet (40 mg total) by mouth daily.   traZODone (DESYREL) 100 MG tablet TAKE 1/2 TO 1 TABLET BY MOUTH AT BEDTIME   [DISCONTINUED] albuterol (VENTOLIN HFA) 108 (90 Base) MCG/ACT inhaler INHALE 2 PUFFS BY MOUTH EVERY 6 HOURS AS NEEDED FOR WHEEZING OR SHORTNESS OF BREATH    Past Medical History:  Diagnosis Date   Carotid artery stenosis    Diabetes mellitus without complication (HCC)    Fatty liver    Headache    sinus   Hyperlipidemia    Hypertension    Multilevel degenerative disc disease    "spurs" on spine also   Neuropathy    hands and feet   Vertigo    mild - daily - positional   Wears dentures    partial lower    Past Surgical History:  Procedure Laterality Date   ABDOMINAL HYSTERECTOMY     CESAREAN SECTION     COLONOSCOPY WITH PROPOFOL N/A 03/20/2015   Procedure: COLONOSCOPY WITH PROPOFOL;  Surgeon: Midge Minium, MD;  Location: [facility];  Service: Endoscopy;  Laterality: N/A;   COLONOSCOPY WITH PROPOFOL N/A 11/14/2020   Procedure: COLONOSCOPY WITH PROPOFOL;  Surgeon: Midge MiniumWohl, Darren, MD;  [facility];  Service: Endoscopy;  Laterality: N/A;  Diabetic - oral meds   DILATION AND CURETTAGE OF UTERUS     ESOPHAGOGASTRODUODENOSCOPY (EGD) WITH PROPOFOL N/A 03/20/2015   Procedure: ESOPHAGOGASTRODUODENOSCOPY (EGD) WITH PROPOFOL;  Surgeon: [name], MD;  [facility];  Service: Endoscopy;  Laterality: N/A;  Diabetic - oral meds   HEMORRHOID SURGERY     LAPAROSCOPIC CHOLECYSTECTOMY     LAPAROSCOPIC SUPRACERVICAL HYSTERECTOMY  2008   SALPINGOOPHORECTOMY Left 2008   TONSILLECTOMY AND ADENOIDECTOMY  1960     Social History Social History   Tobacco Use   Smoking status: Some Days    Packs/day: 0.25    Years: 42.00    Pack years: 10.50    Types: Cigarettes   Smokeless tobacco: Never   Tobacco comments:    has a cigarette every few days  Vaping Use   Vaping Use: Former  Substance Use Topics   Alcohol use: Yes    Alcohol/week: 0.0 standard drinks    Comment: OCCASIONALLY   Drug use: No    Family History Family History  Problem Relation Age of Onset   Diabetes Mother    Hypertension Mother    Diabetes Father    Hypertension Father    Hypertension Brother    Diabetes Paternal Grandmother    Emphysema Paternal Grandfather    Hypertension Brother     Allergies  Allergen Reactions   Hydrocodone Swelling   Penicillins Swelling   Amoxicillin Swelling    Facial, and itching   Hydrocodone Bitartrate Er Swelling   Wellbutrin [Bupropion]     Makes her mean     REVIEW OF SYSTEMS (Negative unless checked)  Constitutional: [] Weight loss  [] Fever  [] Chills Cardiac: [] Chest pain   [] Chest pressure   [] Palpitations   [] Shortness of breath when laying flat   [] Shortness of breath with exertion. Vascular:  [] Pain in legs with walking   [] Pain in legs at rest  [] History of DVT   [] Phlebitis   [] Swelling in legs   [] Varicose veins   [] Non-healing ulcers Pulmonary:   [] Uses home oxygen   [] Productive cough   [] Hemoptysis   [] Wheeze  [] COPD   [] Asthma Neurologic:  [] Dizziness   [] Seizures   [] History of stroke   [] History of TIA  [] Aphasia   [] Vissual changes   [] Weakness or numbness in arm   [] Weakness or numbness in leg Musculoskeletal:   [] Joint swelling   [] Joint pain   [] Low back pain Hematologic:  [] Easy bruising  [] Easy bleeding   [] Hypercoagulable state   [] Anemic Gastrointestinal:  [] Diarrhea   [] Vomiting  [] Gastroesophageal reflux/heartburn   [] Difficulty swallowing. Genitourinary:  [] Chronic kidney disease   [] Difficult urination  [] Frequent urination   [] Blood in  urine Skin:  [] Rashes   [] Ulcers  Psychological:  [] History of anxiety   []  History of major depression.  Physical Examination  Vitals:   05/11/21 1511  BP: (!) 185/90  Pulse: 80  Weight: 120 lb (54.4 kg)  Height: 5' (1.524 m)   Body mass index is 23.44 kg/m. Gen: WD/WN, NAD Head: [place]/AT, No temporalis wasting.  Ear/Nose/Throat: Hearing grossly intact, nares w/o erythema or drainage Eyes: PER, EOMI, sclera nonicteric.  Neck: Supple, no masses.  No bruit or JVD.  Pulmonary:  Good air movement, no audible wheezing, no use of accessory muscles.  Cardiac: RRR, normal S1, S2,  no Murmurs. Vascular:   left carotid bruit Vessel Right Left  Radial Palpable Palpable  Carotid Palpable Palpable  PT Not Palpable Palpable  DP Palpable Palpable  Gastrointestinal: soft, non-distended. No guarding/no peritoneal signs.  Musculoskeletal: M/S 5/5 throughout.  No visible deformity.  Neurologic: CN 2-12 intact. Pain and light touch intact in extremities.  Symmetrical.  Speech is fluent. Motor exam as listed above. Psychiatric: Judgment intact, Mood & affect appropriate for pt's clinical situation. Dermatologic: No rashes or ulcers noted.  No changes consistent with cellulitis.   CBC Lab Results  Component Value Date   WBC 6.2 04/06/2021   HGB 16.1 (H) 04/06/2021   HCT 46.6 04/06/2021   MCV 91 04/06/2021   PLT 300 04/06/2021    BMET    Component Value Date/Time   NA 143 04/06/2021 0844   K 5.0 04/06/2021 0844   CL 103 04/06/2021 0844   CO2 25 04/06/2021 0844   GLUCOSE 210 (H) 04/06/2021 0844   GLUCOSE 207 (H) 04/16/2020 2156   BUN 11 04/06/2021 0844   CREATININE 0.64 04/06/2021 0844   CALCIUM 9.6 04/06/2021 0844   GFRNONAA >60 04/16/2020 2156   GFRAA 106 05/18/2019 0828   CrCl cannot be calculated (Patient's most recent lab result is older than the maximum 21 days allowed.).  COAG No results found for: INR, PROTIME  Radiology VAS [place] ABI WITH/WO TBI  Result Date:  05/07/2021  LOWER EXTREMITY DOPPLER STUDY Patient Name:  Fujie Dickison  Date of Exam:   04/30/2021 Medical Rec #: [id number]            Accession #:    [id number] Date of Birth: January 04, 1961             Patient Gender: F Patient Age:   26 years [facility]am [facility] Procedure:      VAS [place] ABI WITH/WO TBI Referring Phys: [facility] --------------------------------------------------------------------------------  Indications: Peripheral artery disease.  Performing Technologist: Jamse Mead RT, RDMS, RVT  Examination Guidelines: A complete evaluation includes at minimum, Doppler waveform signals and systolic blood pressure reading at the level of bilateral brachial, anterior tibial, and posterior tibial arteries, when vessel segments are accessible. Bilateral testing is considered an integral part of a complete examination. Photoelectric Plethysmograph (PPG) waveforms and toe systolic pressure readings are included as required and additional duplex testing as needed. Limited examinations for reoccurring indications may be performed as noted.  ABI Findings: +---------+------------------+-----+---------+--------+  Right     Rt Pressure (mmHg) Index Waveform  Comment   +---------+------------------+-----+---------+--------+  Brachial  177                                          +---------+------------------+-----+---------+--------+  ATA       183                0.99  triphasic           +---------+------------------+-----+---------+--------+  PTA       183                0.99  biphasic            +---------+------------------+-----+---------+--------+  Great Toe 131                0.71  Normal              +---------+------------------+-----+---------+--------+ +---------+------------------+-----+---------+-------+  Left  Lt Pressure (mmHg) Index Waveform  Comment  +---------+------------------+-----+---------+-------+  Brachial  185                                          +---------+------------------+-----+---------+-------+  ATA       185                1.00  triphasic          +---------+------------------+-----+---------+-------+  PTA       195                1.05  triphasic          +---------+------------------+-----+---------+-------+  Great Toe 132                0.71  Normal             +---------+------------------+-----+---------+-------+  Summary: Bilateral: Bilateral ankle-brachial indexes are within normal range. No evidence of significant lower extremity arterial disease. Bilateral toe-brachial indexes are within normal range.  *See table(s) above for measurements and observations.  Electronically signed by Levora DredgeGregory Trell Secrist MD on 05/07/2021 at 3:56:46 PM.   Final    CT CHEST LUNG CA SCREEN LOW DOSE W/O CM  Result Date: 04/24/2021 CLINICAL DATA:  Current smoker, 87 pack-year history. EXAM: CT CHEST WITHOUT CONTRAST LOW-DOSE FOR LUNG CANCER SCREENING TECHNIQUE: Multidetector CT imaging of the chest was performed following the standard protocol without IV contrast. RADIATION DOSE REDUCTION: This exam was performed according to the departmental dose-optimization program which includes automated exposure control, adjustment of the mA and/or kV according to patient size and/or use of iterative reconstruction technique. COMPARISON:  10/11/2019. FINDINGS: Cardiovascular: Atherosclerotic calcification of the aorta and aortic valve. Heart size normal. No pericardial effusion. Mediastinum/Nodes: No pathologically enlarged mediastinal or axillary lymph nodes. Hilar regions are difficult to definitively evaluate without IV contrast. Esophagus is grossly unremarkable. Lungs/Pleura: Centrilobular and paraseptal emphysema. Smoking related respiratory bronchiolitis. Pulmonary nodules measure 3.5 mm or less in size, as before. No new pulmonary nodules. No pleural fluid. Airway is unremarkable. Upper Abdomen: Visualized portions of the liver, adrenal glands, kidneys, spleen, pancreas,  stomach and bowel are grossly unremarkable. Cholecystectomy. Musculoskeletal: No worrisome lytic or sclerotic lesions. IMPRESSION: 1. Lung-RADS 2, benign appearance or behavior. Continue annual screening with low-dose chest CT without contrast in 12 months. 2. Hypodense lesion seen in the interpolar left kidney on comparison exam is not visualized are not imaged. 3.  Aortic atherosclerosis (ICD10-I70.0). 4.  Emphysema (ICD10-J43.9). Electronically Signed   By: Leanna BattlesMelinda  Blietz M.D.   On: 04/24/2021 13:23  VAS US CAROTID  Result Date: 05/07/2021 Carotid Arterial Duplex Study Patient Name:  Dana Weber  Date of Exam:   04/30/2021 Medical Rec #: [id number]            Accession #:    [id number] Date of Birth: 11/09/1960             Patient Gender: F Patient Age:   [age] years Exam Location:  [facility] Procedure:      VAS US CAROTID Referring Phys: Levora DredgeGREGORY Elyza Whitt --------------------------------------------------------------------------------  Indications: Carotid artery disease. Performing Technologist: Jamse MeadWilliam Hosmer RT, RDMS, RVT  Examination Guidelines: A complete evaluation includes B-mode imaging, spectral Doppler, color Doppler, and power Doppler as needed of all accessible portions of each vessel. Bilateral testing is considered an integral part of a complete examination. Limited examinations for reoccurring  indications may be performed as noted.  Right Carotid Findings: +----------+--------+--------+--------+------------------+------------------+             PSV cm/s EDV cm/s Stenosis Plaque Description Comments            +----------+--------+--------+--------+------------------+------------------+  CCA Prox   91       21                                                       +----------+--------+--------+--------+------------------+------------------+  CCA Mid    92       23                                   intimal thickening   +----------+--------+--------+--------+------------------+------------------+  CCA Distal 81       15                                   intimal thickening  +----------+--------+--------+--------+------------------+------------------+  ICA Prox   85       25       1-39%    heterogenous                           +----------+--------+--------+--------+------------------+------------------+  ICA Mid    73       24                                                       +----------+--------+--------+--------+------------------+------------------+  ICA Distal 95       29                                                       +----------+--------+--------+--------+------------------+------------------+  ECA        124      20                                                       +----------+--------+--------+--------+------------------+------------------+ +----------+--------+-------+----------------+-------------------+             PSV cm/s EDV cms Describe         Arm Pressure (mmHG)  +----------+--------+-------+----------------+-------------------+  Subclavian 132              Multiphasic, WNL                      +----------+--------+-------+----------------+-------------------+ +---------+--------+--+--------+---------+  Vertebral PSV cm/s 49 EDV cm/s Antegrade  +---------+--------+--+--------+---------+  Left Carotid Findings: +----------+--------+--------+--------+------------------+------------------+             PSV cm/s EDV cm/s Stenosis Plaque Description Comments            +----------+--------+--------+--------+------------------+------------------+  CCA Prox   101  21                                                       +----------+--------+--------+--------+------------------+------------------+  CCA Mid    88       21                                   intimal thickening  +----------+--------+--------+--------+------------------+------------------+  CCA Distal 76       22                                    intimal thickening  +----------+--------+--------+--------+------------------+------------------+  ICA Prox   177      49       40-59%   homogeneous                            +----------+--------+--------+--------+------------------+------------------+  ICA Mid    111      29                                                       +----------+--------+--------+--------+------------------+------------------+  ICA Distal 78       26                                                       +----------+--------+--------+--------+------------------+------------------+  ECA        157      27                                                       +----------+--------+--------+--------+------------------+------------------+ +----------+--------+--------+----------------+-------------------+             PSV cm/s EDV cm/s Describe         Arm Pressure (mmHG)  +----------+--------+--------+----------------+-------------------+  Subclavian 131               Multiphasic, WNL                      +----------+--------+--------+----------------+-------------------+ +---------+--------+--+--------+---------+  Vertebral PSV cm/s 42 EDV cm/s Antegrade  +---------+--------+--+--------+---------+  Summary: Right Carotid: Velocities in the right ICA are consistent with a 1-39% stenosis.                The extracranial vessels were near-normal with only minimal wall                thickening or plaque. Left Carotid: Velocities in the left ICA are consistent with a 40-59% stenosis. No significant change when compared to the previous exam on 05/01/20. *See table(s) above for measurements and observations.  Electronically signed by Levora Dredge MD on 05/07/2021 at 3:56:56 PM.    Final  Assessment/Plan 1. Carotid artery stenosis, asymptomatic, left Recommend:   Given the patient's asymptomatic subcritical stenosis no further invasive testing or surgery at this time.   Duplex ultrasound shows <40% RICA and 40-59% LICA stenosis.    Continue antiplatelet therapy as prescribed Continue management of CAD, HTN and Hyperlipidemia Healthy heart diet,  encouraged exercise at least 4 times per week Follow up in 12 months with duplex ultrasound and physical exam - VAS US CAROTID; Future  2. PAD (peripheral artery disease) (HCC) Recommend:   The patient has evidence of atherosclerosis of the lower extremities with claudication.  The patient does not voice lifestyle limiting changes at this point in time.   Noninvasive studies do not suggest clinically significant change.   No invasive studies, angiography or surgery at this time The patient should continue walking and begin a more formal exercise program.  The patient should continue antiplatelet therapy and aggressive treatment of the lipid abnormalities   No changes in the patient's medications at this time - VAS [place] ABI WITH/WO TBI; Future  3. Essential (primary) hypertension Continue antihypertensive medications as already ordered, these medications have been reviewed and there are no changes at this time.   4. Diabetes mellitus without complication (HCC) Continue hypoglycemic medications as already ordered, these medications have been reviewed and there are no changes at this time.  Hgb A1C to be monitored as already arranged by primary service   5. Hyperlipidemia, unspecified hyperlipidemia type Continue statin as ordered and reviewed, no changes at this time     Levora Dredge, MD  05/17/2021 5:12 PM

## 2021-05-31 ENCOUNTER — Other Ambulatory Visit: Payer: Self-pay | Admitting: Family Medicine

## 2021-06-16 ENCOUNTER — Ambulatory Visit: Payer: BC Managed Care – PPO | Admitting: Family Medicine

## 2021-06-16 ENCOUNTER — Encounter: Payer: Self-pay | Admitting: Family Medicine

## 2021-06-16 ENCOUNTER — Other Ambulatory Visit: Payer: Self-pay

## 2021-06-16 VITALS — BP 136/76 | HR 84 | Temp 98.6°F | Resp 14

## 2021-06-16 DIAGNOSIS — J0110 Acute frontal sinusitis, unspecified: Secondary | ICD-10-CM

## 2021-06-16 DIAGNOSIS — J40 Bronchitis, not specified as acute or chronic: Secondary | ICD-10-CM

## 2021-06-16 MED ORDER — LEVOFLOXACIN 750 MG PO TABS: 750.0000 mg | ORAL_TABLET | Freq: Every day | ORAL | 0 refills | Status: AC

## 2021-06-16 NOTE — Progress Notes (Signed)
?  ? ?I,Roshena L Chambers,acting as a scribe for Mila Merry, MD.,have documented all relevant documentation on the behalf of Mila Merry, MD,as directed by  Mila Merry, MD while in the presence of Mila Merry, MD.  ? ? ?Established patient visit ? ? ?Patient: Dana Weber   DOB: Aug 05, 1960   61 y.o. Female  MRN: [id number] ?Visit Date: 06/16/2021 ? ?Today's healthcare provider: Mila Merry, MD  ? ?Chief Complaint  ?Patient presents with  ? Sinus Problem  ? ?Subjective  ?  ?Sinus Problem ?This is a new problem. Episode onset: 4 days ago. The problem has been rapidly worsening since onset. There has been no fever. Associated symptoms include congestion (sinus and chest congestion), coughing (dry cough), diaphoresis, headaches, shortness of breath (when wearing a mask), sinus pressure and a sore throat. Pertinent negatives include no chills. Treatments tried: Coricidin HBP and NyQuil/ DayQuil. Also Vicks Vapo steam. The treatment provided no relief.   ?Home COVID test was negative 2 days ago. ? ?Medications: ?Outpatient Medications Prior to Visit  ?Medication Sig  ? albuterol (VENTOLIN HFA) 108 (90 Base) MCG/ACT inhaler INHALE 2 PUFFS BY MOUTH EVERY 6 HOURS AS NEEDED FOR WHEEZING OR SHORTNESS OF BREATH  ? aspirin EC 81 MG tablet Take 81 mg by mouth daily. Swallow whole.  ? benazepril (LOTENSIN) 5 MG tablet Take 1 tablet (5 mg total) by mouth daily.  ? CINNAMON PO Take by mouth.  ? clotrimazole-betamethasone (LOTRISONE) cream Apply externally BID prn sx up to 2 wks  ? FARXIGA 10 MG TABS tablet TAKE 1 TABLET(10 MG) BY MOUTH DAILY BEFORE BREAKFAST  ? fluticasone (FLONASE) 50 MCG/ACT nasal spray SPRAY 2 SPRAYS INTO EACH NOSTRIL EVERY DAY  ? glipiZIDE (GLUCOTROL XL) 5 MG 24 hr tablet TAKE 1 TABLET BY MOUTH EVERY DAY WITH BREAKFAST  ? JANUVIA 100 MG tablet TAKE 1 TABLET(100 MG) BY MOUTH DAILY  ? lovastatin (MEVACOR) 20 MG tablet Take 1 tablet (20 mg total) by mouth daily.  ? meloxicam (MOBIC) 7.5 MG tablet  Take 1-2 tablets (7.5-15 mg total) by mouth daily as needed for pain.  ? metFORMIN (GLUCOPHAGE-XR) 500 MG 24 hr tablet TAKE 2 TABLETS BY MOUTH TWICE A DAY  ? PARoxetine (PAXIL) 40 MG tablet Take 1 tablet (40 mg total) by mouth daily.  ? traZODone (DESYREL) 100 MG tablet TAKE 1/2 TO 1 TABLET BY MOUTH AT BEDTIME  ? cyclobenzaprine (FLEXERIL) 5 MG tablet Take 1-2 tablets (5-10 mg total) by mouth 3 (three) times daily as needed (shoulder pain). (Patient not taking: Reported on 02/24/2021)  ? gabapentin (NEURONTIN) 300 MG capsule TAKE 1 CAPSULE BY MOUTH EVERYDAY AT BEDTIME  ? linaclotide (LINZESS) 72 MCG capsule Take 1 capsule (72 mcg total) by mouth daily before breakfast.  ? [DISCONTINUED] Probiotic Product (PROBIOTIC PO) Take by mouth.  ? ?No facility-administered medications prior to visit.  ? ? ?Review of Systems  ?Constitutional:  Positive for diaphoresis. Negative for chills, fatigue and fever.  ?HENT:  Positive for congestion (sinus and chest congestion), rhinorrhea, sinus pressure and sore throat.   ?Respiratory:  Positive for cough (dry cough) and shortness of breath (when wearing a mask).   ?Neurological:  Positive for headaches.  ? ? ?  Objective  ?  ?BP 136/76 (BP Location: Left Arm, Patient Position: Sitting, Cuff Size: Normal)   Pulse 84   Temp 98.6 ?F (37 ?C) (Oral)   Resp 14   SpO2 97% Comment: room air ? ? ?Physical Exam  ? ?General Appearance:  Well developed, well nourished female, alert, cooperative, in no acute distress  ?HENT:   neck without nodes, sinuses nontender, frontal sinus tender, post nasal drip noted, and nasal mucosa pale and congested  ?Eyes:    PERRL, conjunctiva/corneas clear, EOM's intact       ?Lungs:     Clear to auscultation bilaterally, respirations unlabored  ?Heart:    Normal heart rate. Normal rhythm. No murmurs, rubs, or gallops.    ?Neurologic:   Awake, alert, oriented x 3. No apparent focal neurological           defect.   ?   ?  ? Assessment & Plan  ?  ? ?1. Acute  non-recurrent frontal sinusitis ? ?- levofloxacin (LEVAQUIN) 750 MG tablet; Take 1 tablet (750 mg total) by mouth daily for 7 days.  Dispense: 7 tablet; Refill: 0 ? ?Call if symptoms change or if not rapidly improving.  ? ?   ? ?The entirety of the information documented in the History of Present Illness, Review of Systems and Physical Exam were personally obtained by me. Portions of this information were initially documented by the CMA and reviewed by me for thoroughness and accuracy.   ? ? ?Mila Merry, MD  ?[facility] ?[phone] (phone) ?[phone] (fax) ? ?[facility]  ?

## 2021-06-18 ENCOUNTER — Ambulatory Visit: Payer: Self-pay

## 2021-06-18 DIAGNOSIS — R059 Cough, unspecified: Secondary | ICD-10-CM

## 2021-06-18 MED ORDER — BENZONATATE 200 MG PO CAPS: 200.0000 mg | ORAL_CAPSULE | Freq: Two times a day (BID) | ORAL | 0 refills | Status: DC | PRN

## 2021-06-18 NOTE — Telephone Encounter (Signed)
Have sent prescription for tessalon to [facility] for cough. She can take this along with OTC robutussin.  ?

## 2021-06-18 NOTE — Telephone Encounter (Signed)
Pt called reporting that she was prescribed an antibiotic for her symptoms however she was not given anything for her cough. Also reports that she does not understand why he did not listen to her lungs or her chest. Says he only checked her nose and ears and did not even look in her throat.  ? ?[id number] [place], [place] - [address]  ?[address] [place] [address]  ?Phone: [phone] Fax: [phone]  ? ? ? ?Chief Complaint: Continued cough, OTC not helping. Cough interrupts sleep.Has some wheezing. ?Symptoms: Above ?Frequency: Started last week. ?Pertinent Negatives: Patient denies fever ?Disposition: [] ED /[] Urgent Care (no appt availability in office) / [] Appointment(In office/virtual)/ []  [facility]/ [] Home Care/ [] Refused Recommended Disposition /[]  Mobile Bus/ []  Follow-up with PCP ?Additional Notes: Pt. Asking for cough medication.Please advise.  ?Answer Assessment - Initial Assessment Questions ?1. ONSET: "When did the cough begin?"  ?    Last Friday - took Coricidin ?2. SEVERITY: "How bad is the cough today?"  ?    Severe ?3. SPUTUM: "Describe the color of your sputum" (none, dry cough; clear, white, yellow, green) ?    Yellow ?4. HEMOPTYSIS: "Are you coughing up any blood?" If so ask: "How much?" (flecks, streaks, tablespoons, etc.) ?    No ?5. DIFFICULTY BREATHING: "Are you having difficulty breathing?" If Yes, ask: "How bad is it?" (e.g., mild, moderate, severe)  ?  - MILD: No SOB at rest, mild SOB with walking, speaks normally in sentences, can lie down, no retractions, pulse < 100.  ?  - MODERATE: SOB at rest, SOB with minimal exertion and prefers to sit, cannot lie down flat, speaks in phrases, mild retractions, audible wheezing, pulse 100-120.  ?  - SEVERE: Very SOB at rest, speaks in single words, struggling to breathe, sitting hunched forward, retractions, pulse > 120  ?    Mild  ?6. FEVER: "Do you have a fever?" If Yes, ask: "What is your temperature, how was  it measured, and when did it start?" ?    No ?7. CARDIAC HISTORY: "Do you have any history of heart disease?" (e.g., heart attack, congestive heart failure)  ?    No ?8. LUNG HISTORY: "Do you have any history of lung disease?"  (e.g., pulmonary embolus, asthma, emphysema) ?    No ?9. PE RISK FACTORS: "Do you have a history of blood clots?" (or: recent major surgery, recent prolonged travel, bedridden) ?    No ?10. OTHER SYMPTOMS: "Do you have any other symptoms?" (e.g., runny nose, wheezing, chest pain) ?      Coughing at night, wheezing ?11. PREGNANCY: "Is there any chance you are pregnant?" "When was your last menstrual period?" ?      No ?12. TRAVEL: "Have you traveled out of the country in the last month?" (e.g., travel history, exposures) ?      No ? ?Protocols used: Cough - Acute Productive-A-AH ? ?

## 2021-06-19 NOTE — Telephone Encounter (Signed)
Patient advised and verbalized understanding 

## 2021-07-10 ENCOUNTER — Ambulatory Visit: Payer: BC Managed Care – PPO | Admitting: Family Medicine

## 2021-07-15 ENCOUNTER — Other Ambulatory Visit: Payer: Self-pay | Admitting: Family Medicine

## 2021-07-15 DIAGNOSIS — F410 Panic disorder [episodic paroxysmal anxiety] without agoraphobia: Secondary | ICD-10-CM

## 2021-07-17 ENCOUNTER — Ambulatory Visit: Payer: BC Managed Care – PPO | Admitting: Family Medicine

## 2021-07-27 ENCOUNTER — Ambulatory Visit: Payer: BC Managed Care – PPO | Admitting: Family Medicine

## 2021-07-27 ENCOUNTER — Encounter: Payer: Self-pay | Admitting: Family Medicine

## 2021-07-27 VITALS — BP 136/72 | HR 85 | Temp 98.2°F | Resp 16 | Wt 119.8 lb

## 2021-07-27 DIAGNOSIS — K2100 Gastro-esophageal reflux disease with esophagitis, without bleeding: Secondary | ICD-10-CM

## 2021-07-27 DIAGNOSIS — K5909 Other constipation: Secondary | ICD-10-CM

## 2021-07-27 DIAGNOSIS — R1010 Upper abdominal pain, unspecified: Secondary | ICD-10-CM

## 2021-07-27 DIAGNOSIS — E119 Type 2 diabetes mellitus without complications: Secondary | ICD-10-CM | POA: Diagnosis not present

## 2021-07-27 LAB — POCT GLYCOSYLATED HEMOGLOBIN (HGB A1C)
Est. average glucose Bld gHb Est-mCnc: 183
Hemoglobin A1C: 8 % — AB (ref 4.0–5.6)

## 2021-07-27 MED ORDER — OMEPRAZOLE 20 MG PO CPDR: 20.0000 mg | DELAYED_RELEASE_CAPSULE | Freq: Every day | ORAL | 2 refills | Status: DC

## 2021-07-27 NOTE — Progress Notes (Signed)
?  ? ?I,Joseline E Rosas,acting as a scribe for Lelon Huh, MD.,have documented all relevant documentation on the behalf of Lelon Huh, MD,as directed by  Lelon Huh, MD while in the presence of Lelon Huh, MD.  ? ?Established patient visit ? ? ?Patient: Dana Weber   DOB: 1960/08/14   61 y.o. Female  MRN: [id number] ?Visit Date: 07/27/2021 ? ?Today's healthcare provider: Lelon Huh, MD  ? ?Chief Complaint  ?Patient presents with  ? follow-up DM, HTN   ? ?Subjective  ?  ?HPI  ?Diabetes Mellitus Type II, Follow-up ? ?Lab Results  ?Component Value Date  ? HGBA1C 8.0 (A) 02/24/2021  ? HGBA1C 8.4 (A) 10/24/2020  ? HGBA1C 11.8 (A) 08/13/2020  ? ?Wt Readings from Last 3 Encounters:  ?07/27/21 119 lb 12.8 oz (54.3 kg)  ?05/11/21 120 lb (54.4 kg)  ?04/22/21 117 lb (53.1 kg)  ? ?Last seen for diabetes 5 months ago.  ?Management since then includes: discussed changing Rybelsus to Januvia if not continuing to improve at follow up in about 4 months. ?She reports excellent compliance with treatment. ?She is not having side effects.  ?Symptoms: ?Yes fatigue No foot ulcerations  ?No appetite changes Yes nausea  ?Yes paresthesia of the feet  No polydipsia  ?No polyuria No visual disturbances   ?No vomiting   ? ? ?Home blood sugar records: fasting range: 120's-130's ? ?Episodes of hypoglycemia? No  ?  ?Current insulin regiment: none ?Most Recent Eye Exam: 2 weeks ago ?Current exercise: walking ?Current diet habits: well balanced ? ?Pertinent Labs: ?Lab Results  ?Component Value Date  ? CHOL 165 04/06/2021  ? HDL 47 04/06/2021  ? LDLCALC 102 (H) 04/06/2021  ? TRIG 87 04/06/2021  ? CHOLHDL 3.5 04/06/2021  ? Lab Results  ?Component Value Date  ? NA 143 04/06/2021  ? K 5.0 04/06/2021  ? CREATININE 0.64 04/06/2021  ? EGFR 102 04/06/2021  ? MICROALBUR 20 03/13/2018  ?  ? ?---------------------------------------------------------------------------------------------------  ?Hypertension, follow-up ? ?BP Readings from  Last 3 Encounters:  ?07/27/21 136/72  ?06/16/21 136/76  ?05/11/21 (!) 185/90  ? Wt Readings from Last 3 Encounters:  ?07/27/21 119 lb 12.8 oz (54.3 kg)  ?05/11/21 120 lb (54.4 kg)  ?04/22/21 117 lb (53.1 kg)  ?  ? ?She was last seen for hypertension 5 months ago.  ?BP at that visit was 130/80. Management since that visit includes continue same medications. ? ?She reports excellent compliance with treatment. ?She is not having side effects.  ?She is following a  well balanced  diet. ?She is exercising. ?She does smoke. ? ?Outside blood pressures are 120's/70's. ?Symptoms: ?Yes chest pain, (some, last one was a week ago) No chest pressure  ?No palpitations  No syncope  ?No dyspnea No orthopnea  ?No paroxysmal nocturnal dyspnea No lower extremity edema  ? ? ?---------------------------------------------------------------------------------------------------  ? ?Depression, Follow-up ? ?She  was last seen for this 5 months ago. ?Changes made at last visit include none. Continue same dose of paroxetine for now.  ?  ?She reports excellent compliance with treatment. ?She is not having side effects.  ? ?She reports excellent tolerance of treatment. ?Current symptoms include: depressed mood, fatigue, and insomnia ?She feels she is Improved since last visit. ? ? ?  07/27/2021  ?  4:06 PM 02/24/2021  ?  4:22 PM 08/13/2020  ?  8:22 AM  ?Depression screen PHQ 2/9  ?Decreased Interest 2 2 2   ?Down, Depressed, Hopeless 1 2 2   ?PHQ - 2 Score  3 4 4   ?Altered sleeping 2 2 2   ?Tired, decreased energy 3 3 2   ?Change in appetite 1 1 2   ?Feeling bad or failure about yourself  1 2 2   ?Trouble concentrating 2 2 2   ?Moving slowly or fidgety/restless 1 1 2   ?Suicidal thoughts 1 1 1   ?PHQ-9 Score 14 16 17   ?Difficult doing work/chores Somewhat difficult Somewhat difficult Very difficult  ?  ?-----------------------------------------------------------------------------------------  ? ?Medications: ?Outpatient Medications Prior to Visit  ?Medication  Sig  ? albuterol (VENTOLIN HFA) 108 (90 Base) MCG/ACT inhaler INHALE 2 PUFFS BY MOUTH EVERY 6 HOURS AS NEEDED FOR WHEEZING OR SHORTNESS OF BREATH  ? aspirin EC 81 MG tablet Take 81 mg by mouth daily. Swallow whole.  ? benazepril (LOTENSIN) 5 MG tablet Take 1 tablet (5 mg total) by mouth daily.  ? benzonatate (TESSALON) 200 MG capsule Take 1 capsule (200 mg total) by mouth 2 (two) times daily as needed for cough.  ? CINNAMON PO Take by mouth.  ? clotrimazole-betamethasone (LOTRISONE) cream Apply externally BID prn sx up to 2 wks  ? FARXIGA 10 MG TABS tablet TAKE 1 TABLET(10 MG) BY MOUTH DAILY BEFORE BREAKFAST  ? fluticasone (FLONASE) 50 MCG/ACT nasal spray SPRAY 2 SPRAYS INTO EACH NOSTRIL EVERY DAY  ? glipiZIDE (GLUCOTROL XL) 5 MG 24 hr tablet TAKE 1 TABLET BY MOUTH EVERY DAY WITH BREAKFAST  ? JANUVIA 100 MG tablet TAKE 1 TABLET(100 MG) BY MOUTH DAILY  ? lovastatin (MEVACOR) 20 MG tablet Take 1 tablet (20 mg total) by mouth daily.  ? meloxicam (MOBIC) 7.5 MG tablet Take 1-2 tablets (7.5-15 mg total) by mouth daily as needed for pain.  ? metFORMIN (GLUCOPHAGE-XR) 500 MG 24 hr tablet TAKE 2 TABLETS BY MOUTH TWICE A DAY  ? PARoxetine (PAXIL) 40 MG tablet TAKE 1 TABLET BY MOUTH EVERY DAY  ? traZODone (DESYREL) 100 MG tablet TAKE 1/2 TO 1 TABLET BY MOUTH AT BEDTIME  ? cyclobenzaprine (FLEXERIL) 5 MG tablet Take 1-2 tablets (5-10 mg total) by mouth 3 (three) times daily as needed (shoulder pain). (Patient not taking: Reported on 02/24/2021)  ? gabapentin (NEURONTIN) 300 MG capsule TAKE 1 CAPSULE BY MOUTH EVERYDAY AT BEDTIME  ? linaclotide (LINZESS) 72 MCG capsule Take 1 capsule (72 mcg total) by mouth daily before breakfast.  ? ?No facility-administered medications prior to visit.  ? ? ? ? ?  Objective  ?  ?BP 136/72 (BP Location: Left Arm, Patient Position: Sitting, Cuff Size: Normal)   Pulse 85   Temp 98.2 ?F (36.8 ?C) (Oral)   Resp 16   Wt 119 lb 12.8 oz (54.3 kg)   BMI 23.40 kg/m?  ? ? ?Physical Exam  ? ? ?General:  Appearance:    Well developed, well nourished female in no acute distress  ?Eyes:    PERRL, conjunctiva/corneas clear, EOM's intact       ?Lungs:     Clear to auscultation bilaterally, respirations unlabored  ?Heart:    Normal heart rate. Normal rhythm. No murmurs, rubs, or gallops.    ?MS:   All extremities are intact.    ?Neurologic:   Awake, alert, oriented x 3. No apparent focal neurological defect.   ?   ?  ?Results for orders placed or performed in visit on 07/27/21  ?POCT glycosylated hemoglobin (Hb A1C)  ?Result Value Ref Range  ? Hemoglobin A1C 8.0 (A) 4.0 - 5.6 %  ? Est. average glucose Bld gHb Est-mCnc 183   ?  ? ?  Assessment & Plan  ?  ? ?1. Diabetes mellitus without complication ([place]) ?A1c stable at 8.0. Previously discussed changing Januvia to Rybelsus, although this may aggravate chronic chronic constipation as below. Will continue current medications for the time being. ? ?2. Upper abdominal pain ?Frequently occurs after eating, otherwise non specific.  ?- US Abdomen Complete; Future ? ?3. Chronic constipation ?Recommend she start taking OTC powdered fiber such as metamucil every day. Can take Miralax intermittently if needed, but fiber should be first line.  ? ?4. Gastroesophageal reflux disease with esophagitis, unspecified whether hemorrhage ?She described frequent burning sensation in her chest after eating and states she has been told in the past she has hiatal hernia. Will try  omeprazole (PRILOSEC) 20 MG capsule; Take 1 capsule (20 mg total) by mouth daily.  Dispense: 30 capsule; Refill: 2  ? ?Future Appointments  ?Date Time Provider [place]  ?07/28/2021 10:15 AM [facility]  ?11/16/2021  4:00 PM Birdie Sons, MD [facility]  ?05/11/2022  1:00 PM [facility] VASC 3 [facility] None  ?05/11/2022  1:30 PM [facility] VASC 3 [facility] None  ?05/11/2022  2:00 PM Kris Hartmann, NP [facility] None  ?  ?   ? ?The entirety of the information documented in the History of Present Illness, Review  of Systems and Physical Exam were personally obtained by me. Portions of this information were initially documented by the CMA and reviewed by me for thoroughness and accuracy.   ? ? ?Lelon Huh, MD  ?B

## 2021-07-27 NOTE — Patient Instructions (Signed)
Please review the attached list of medications and notify my office if there are any errors.  ? ?Please bring all of your medications to every appointment so we can make sure that our medication list is the same as yours.  ? ?Start taking metamucil once every day  ?

## 2021-07-28 ENCOUNTER — Ambulatory Visit: Payer: BC Managed Care – PPO

## 2021-08-04 ENCOUNTER — Ambulatory Visit
Admission: RE | Admit: 2021-08-04 | Discharge: 2021-08-04 | Disposition: A | Discharge status: Home / Self Care | Payer: BC Managed Care – PPO | Source: Ambulatory Visit | Attending: Family Medicine | Admitting: Family Medicine

## 2021-08-04 DIAGNOSIS — R1010 Upper abdominal pain, unspecified: Secondary | ICD-10-CM | POA: Diagnosis not present

## 2021-08-07 ENCOUNTER — Telehealth: Payer: Self-pay

## 2021-08-07 DIAGNOSIS — R1010 Upper abdominal pain, unspecified: Secondary | ICD-10-CM

## 2021-08-07 NOTE — Telephone Encounter (Signed)
FYI-Patient aware of results. Reports that she is not seeing any difference with the omeprazole. Referral placed to GI.

## 2021-08-07 NOTE — Telephone Encounter (Signed)
-----   Message from Malva Limes, MD sent at 08/07/2021  2:06 PM EDT ----- US shows some signs of mild fatty liver disease. Best thing for that is to avoid sweets and starchy foods in diet.  No other abnormalities that would explain the pain she has been having.  She was put on omeprazole at her visit earlier this month. If doing better then just stick with the omeprazole. Otherwise would need referral to GI.

## 2021-09-01 ENCOUNTER — Ambulatory Visit: Payer: BC Managed Care – PPO | Admitting: Gastroenterology

## 2021-09-04 ENCOUNTER — Other Ambulatory Visit: Payer: Self-pay | Admitting: Family Medicine

## 2021-09-04 DIAGNOSIS — I10 Essential (primary) hypertension: Secondary | ICD-10-CM

## 2021-09-04 NOTE — Telephone Encounter (Signed)
Refilled 08/05/2020 #90 4 refills. Requested Prescriptions  Pending Prescriptions Disp Refills  . benazepril (LOTENSIN) 5 MG tablet [Pharmacy Med Name: BENAZEPRIL HCL 5 MG TABLET] 90 tablet 4    Sig: TAKE 1 TABLET (5 MG TOTAL) BY MOUTH DAILY.     There is no refill protocol information for this order

## 2021-09-07 ENCOUNTER — Other Ambulatory Visit: Payer: Self-pay | Admitting: Family Medicine

## 2021-09-07 DIAGNOSIS — I10 Essential (primary) hypertension: Secondary | ICD-10-CM

## 2021-09-07 MED ORDER — BENAZEPRIL HCL 5 MG PO TABS: 5.0000 mg | ORAL_TABLET | Freq: Every day | ORAL | 1 refills | Status: DC

## 2021-09-07 NOTE — Telephone Encounter (Signed)
Pt called, let pt know that the [facility] No further assistance needed.

## 2021-09-07 NOTE — Addendum Note (Signed)
Addended by: Ross Ludwig on: 09/07/2021 05:35 PM   Modules accepted: Orders

## 2021-09-07 NOTE — Telephone Encounter (Signed)
benazepril (LOTENSIN) 5 MG tablet 90 tablet 4 08/05/2020 10/29/2021  Pt states bottle says no refills, states only takes 1 a day, Pt is out if med, pt wants someone to fu what she is supposed to do about taking med? Fu at [phone]

## 2021-09-07 NOTE — Telephone Encounter (Signed)
Requested Prescriptions  Pending Prescriptions Disp Refills  . benazepril (LOTENSIN) 5 MG tablet 90 tablet 4    Sig: Take 1 tablet (5 mg total) by mouth daily.     Cardiovascular:  ACE Inhibitors Passed - 09/07/2021  5:38 PM      Passed - Cr in normal range and within 180 days    Creatinine, Ser  Date Value Ref Range Status  04/06/2021 0.64 0.57 - 1.00 mg/dL Final   Creatinine, POC  Date Value Ref Range Status  03/13/2018 n/a mg/dL Final         Passed - K in normal range and within 180 days    Potassium  Date Value Ref Range Status  04/06/2021 5.0 3.5 - 5.2 mmol/L Final         Passed - Patient is not pregnant      Passed - Last BP in normal range    BP Readings from Last 1 Encounters:  07/27/21 136/72         Passed - Valid encounter within last 6 months    Recent Outpatient Visits          1 month ago Diabetes mellitus without complication [facility])   [facility] Malva Limes, MD   2 months ago Acute non-recurrent frontal sinusitis   [facility] Malva Limes, MD   6 months ago Diabetes mellitus without complication [facility])   [facility] Malva Limes, MD   10 months ago Diabetes mellitus without complication [facility])   [facility] Malva Limes, MD   1 year ago Uncontrolled type 2 diabetes mellitus with hyperglycemia [facility])   [facility] Malva Limes, MD      Future Appointments            In 2 months Fisher, Demetrios Isaacs, MD [facility]

## 2021-10-12 ENCOUNTER — Other Ambulatory Visit: Payer: Self-pay | Admitting: Family Medicine

## 2021-10-12 DIAGNOSIS — J069 Acute upper respiratory infection, unspecified: Secondary | ICD-10-CM

## 2021-10-23 ENCOUNTER — Other Ambulatory Visit: Payer: Self-pay | Admitting: Family Medicine

## 2021-10-23 DIAGNOSIS — K2100 Gastro-esophageal reflux disease with esophagitis, without bleeding: Secondary | ICD-10-CM

## 2021-11-16 ENCOUNTER — Ambulatory Visit: Payer: BC Managed Care – PPO | Admitting: Family Medicine

## 2021-11-16 NOTE — Progress Notes (Deleted)
Established patient visit   Patient: Dana Weber   DOB: [birth year]   61 y.o. Female  MRN: [id number] Visit Date: 11/16/2021  Today's healthcare provider: Lelon Huh, MD   No chief complaint on file.  Subjective    HPI  Diabetes Mellitus Type II, Follow-up  Lab Results  Component Value Date   HGBA1C 8.0 (A) 07/27/2021   HGBA1C 8.0 (A) 02/24/2021   HGBA1C 8.4 (A) 10/24/2020   Wt Readings from Last 3 Encounters:  07/27/21 119 lb 12.8 oz (54.3 kg)  05/11/21 120 lb (54.4 kg)  04/22/21 117 lb (53.1 kg)   Last seen for diabetes {1-12:18279} {days/wks/mos/yrs:310907} ago.  Management since then includes ***. She reports {excellent/good/fair/poor:19665} compliance with treatment. She {is/is not:21021397} having side effects. {document side effects if present:1} Symptoms: {Yes/No:20286} fatigue {Yes/No:20286} foot ulcerations  {Yes/No:20286} appetite changes {Yes/No:20286} nausea  {Yes/No:20286} paresthesia of the feet  {Yes/No:20286} polydipsia  {Yes/No:20286} polyuria {Yes/No:20286} visual disturbances   {Yes/No:20286} vomiting     Home blood sugar records: {diabetes glucometry results:16657}  Episodes of hypoglycemia? {Yes/No:20286} {enter symptoms and frequency of symptoms if yes:1}   Current insulin regiment: {enter 'none' or type of insulin and number of units taken with each dose of each insulin formulation that the patient is taking:1} Most Recent Eye Exam: *** {Current exercise:16438:::1} {Current diet habits:16563:::1}  Pertinent Labs: Lab Results  Component Value Date   CHOL 165 04/06/2021   HDL 47 04/06/2021   LDLCALC 102 (H) 04/06/2021   TRIG 87 04/06/2021   CHOLHDL 3.5 04/06/2021   Lab Results  Component Value Date   NA 143 04/06/2021   K 5.0 04/06/2021   CREATININE 0.64 04/06/2021   EGFR 102 04/06/2021   MICROALBUR 20 03/13/2018     ---------------------------------------------------------------------------------------------------    Medications: Outpatient Medications Prior to Visit  Medication Sig   albuterol (VENTOLIN HFA) 108 (90 Base) MCG/ACT inhaler INHALE 2 PUFFS BY MOUTH EVERY 6 HOURS AS NEEDED FOR WHEEZING OR SHORTNESS OF BREATH   aspirin EC 81 MG tablet Take 81 mg by mouth daily. Swallow whole.   benazepril (LOTENSIN) 5 MG tablet Take 1 tablet (5 mg total) by mouth daily.   benzonatate (TESSALON) 200 MG capsule Take 1 capsule (200 mg total) by mouth 2 (two) times daily as needed for cough.   CINNAMON PO Take by mouth.   clotrimazole-betamethasone (LOTRISONE) cream Apply externally BID prn sx up to 2 wks   cyclobenzaprine (FLEXERIL) 5 MG tablet Take 1-2 tablets (5-10 mg total) by mouth 3 (three) times daily as needed (shoulder pain). (Patient not taking: Reported on 02/24/2021)   FARXIGA 10 MG TABS tablet TAKE 1 TABLET(10 MG) BY MOUTH DAILY BEFORE BREAKFAST   fluticasone (FLONASE) 50 MCG/ACT nasal spray SPRAY 2 SPRAYS INTO EACH NOSTRIL EVERY DAY   gabapentin (NEURONTIN) 300 MG capsule TAKE 1 CAPSULE BY MOUTH EVERYDAY AT BEDTIME   glipiZIDE (GLUCOTROL XL) 5 MG 24 hr tablet TAKE 1 TABLET BY MOUTH EVERY DAY WITH BREAKFAST   JANUVIA 100 MG tablet TAKE 1 TABLET(100 MG) BY MOUTH DAILY   linaclotide (LINZESS) 72 MCG capsule Take 1 capsule (72 mcg total) by mouth daily before breakfast.   lovastatin (MEVACOR) 20 MG tablet Take 1 tablet (20 mg total) by mouth daily.   meloxicam (MOBIC) 7.5 MG tablet Take 1-2 tablets (7.5-15 mg total) by mouth daily as needed for pain.   metFORMIN (GLUCOPHAGE-XR) 500 MG 24 hr tablet TAKE 2 TABLETS BY MOUTH TWICE A DAY   omeprazole (  PRILOSEC) 20 MG capsule TAKE 1 CAPSULE BY MOUTH EVERY DAY   PARoxetine (PAXIL) 40 MG tablet TAKE 1 TABLET BY MOUTH EVERY DAY   traZODone (DESYREL) 100 MG tablet TAKE 1/2 TO 1 TABLET BY MOUTH AT BEDTIME   No facility-administered medications prior to visit.    Review of Systems  {Labs  Heme  Chem  Endocrine  Serology  Results Review  (optional):23779}   Objective    There were no vitals taken for this visit. {Show previous vital signs (optional):23777}  Physical Exam  ***  No results found for any visits on 11/16/21.  Assessment & Plan     ***  No follow-ups on file.      {provider attestation***:1}   Lelon Huh, MD  [facility] [phone] (phone) [phone] (fax)  [place]

## 2021-11-24 ENCOUNTER — Encounter: Payer: Self-pay | Admitting: Family Medicine

## 2021-11-24 ENCOUNTER — Ambulatory Visit: Payer: BC Managed Care – PPO | Admitting: Family Medicine

## 2021-11-24 VITALS — BP 136/74 | HR 75 | Temp 97.6°F | Resp 16 | Wt 119.6 lb

## 2021-11-24 DIAGNOSIS — M545 Low back pain, unspecified: Secondary | ICD-10-CM

## 2021-11-24 DIAGNOSIS — E119 Type 2 diabetes mellitus without complications: Secondary | ICD-10-CM | POA: Diagnosis not present

## 2021-11-24 DIAGNOSIS — E785 Hyperlipidemia, unspecified: Secondary | ICD-10-CM

## 2021-11-24 DIAGNOSIS — K5909 Other constipation: Secondary | ICD-10-CM | POA: Diagnosis not present

## 2021-11-24 DIAGNOSIS — R350 Frequency of micturition: Secondary | ICD-10-CM | POA: Diagnosis not present

## 2021-11-24 DIAGNOSIS — G250 Essential tremor: Secondary | ICD-10-CM

## 2021-11-24 DIAGNOSIS — G8929 Other chronic pain: Secondary | ICD-10-CM

## 2021-11-24 LAB — POCT URINALYSIS DIPSTICK
Bilirubin, UA: NEGATIVE
Color, UA: NORMAL
Glucose, UA: POSITIVE — AB
Ketones, UA: NEGATIVE
Leukocytes, UA: NEGATIVE
Nitrite, UA: NEGATIVE
Odor: NORMAL
Protein, UA: POSITIVE — AB
Spec Grav, UA: 1.01 (ref 1.010–1.025)
Urobilinogen, UA: NEGATIVE E.U./dL — AB
pH, UA: 7.5 (ref 5.0–8.0)

## 2021-11-24 LAB — POCT GLYCOSYLATED HEMOGLOBIN (HGB A1C)
Est. average glucose Bld gHb Est-mCnc: 171
Hemoglobin A1C: 7.6 % — AB (ref 4.0–5.6)

## 2021-11-24 MED ORDER — LOVASTATIN 20 MG PO TABS: 20.0000 mg | ORAL_TABLET | Freq: Every day | ORAL | 4 refills | Status: DC

## 2021-11-24 MED ORDER — NAPROXEN 500 MG PO TABS: 500.0000 mg | ORAL_TABLET | Freq: Two times a day (BID) | ORAL | 2 refills | Status: DC

## 2021-11-24 MED ORDER — LUBIPROSTONE 24 MCG PO CAPS: 24.0000 ug | ORAL_CAPSULE | Freq: Two times a day (BID) | ORAL | 4 refills | Status: DC

## 2021-11-24 NOTE — Patient Instructions (Signed)
.   Please review the attached list of medications and notify my office if there are any errors.   . Please bring all of your medications to every appointment so we can make sure that our medication list is the same as yours.   

## 2021-11-24 NOTE — Progress Notes (Signed)
I,Jana Robinson,acting as a scribe for Lelon Huh, MD.,have documented all relevant documentation on the behalf of Lelon Huh, MD,as directed by  Lelon Huh, MD while in the presence of Lelon Huh, MD.   Established patient visit   Patient: Dana Weber   DOB: 12/19/1960   61 y.o. Female  MRN: [id number] Visit Date: 11/24/2021  Today's healthcare provider: Lelon Huh, MD   Chief Complaint  Patient presents with   Diabetes   Gastroesophageal Reflux   Subjective    Diabetes Mellitus Type II, Follow-up  Lab Results  Component Value Date   HGBA1C 8.0 (A) 07/27/2021   HGBA1C 8.0 (A) 02/24/2021   HGBA1C 8.4 (A) 10/24/2020   Wt Readings from Last 3 Encounters:  11/24/21 119 lb 9.6 oz (54.3 kg)  07/27/21 119 lb 12.8 oz (54.3 kg)  05/11/21 120 lb (54.4 kg)   Last seen for diabetes 4 months ago.  Management since then includes: continue current medications for the time being.   . She reports excellent compliance with treatment. She is not having side effects.  Symptoms: Yes fatigue No foot ulcerations  No appetite changes No nausea  No paresthesia of the feet  No polydipsia  No polyuria No visual disturbances   No vomiting     Home blood sugar records: fasting range: average 140 sometimes lower    Episodes of hypoglycemia? Occasionally  Current insulin regiment:none  Most Recent Eye Exam: Spring '23 Current exercise: none Current diet habits: in general, a "healthy" diet    Pertinent Labs: Lab Results  Component Value Date   CHOL 165 04/06/2021   HDL 47 04/06/2021   LDLCALC 102 (H) 04/06/2021   TRIG 87 04/06/2021   CHOLHDL 3.5 04/06/2021   Lab Results  Component Value Date   NA 143 04/06/2021   K 5.0 04/06/2021   CREATININE 0.64 04/06/2021   EGFR 102 04/06/2021   MICROALBUR 20 03/13/2018     --------------------------------------------------------------------------------------------------- Follow up for Gastroesophageal reflux  disease with esophagitis, unspecified whether hemorrhage  The patient was last seen for this 4 months ago. Changes made at last visit include: Will try omeprazole 20 MG capsule daily.  She reports excellent compliance with treatment. She feels that condition is Improved. She is not having side effects.    ----------------------------------------------------------------------------------------- Reports urinary frequency and requesting urine check.   She also complains of more persistent low back pain which she has been experiencing for the last couple of years. Was last prescribed cyclobenzaprine which didn't really helps. Was also prescribed naprosyn years ago which was more effective.   She also complains of frequent tremors in her hands. Tremors occur with use of hands and sometimes at rest. Is having more trouble holding cups and writing utensils. States she also sometimes has gait and balance problems.   She also complains of constipation for the last couple of years. Was prescribed Linzess last year by GI when she had hemorrhoids, but didn't find it helped. She had no adverse effects from the medication. She is up to date on colonoscopy screening.   Medications: Outpatient Medications Prior to Visit  Medication Sig   albuterol (VENTOLIN HFA) 108 (90 Base) MCG/ACT inhaler INHALE 2 PUFFS BY MOUTH EVERY 6 HOURS AS NEEDED FOR WHEEZING OR SHORTNESS OF BREATH   aspirin EC 81 MG tablet Take 81 mg by mouth daily. Swallow whole.   benazepril (LOTENSIN) 5 MG tablet Take 1 tablet (5 mg total) by mouth daily.   CINNAMON PO Take  by mouth.   clotrimazole-betamethasone (LOTRISONE) cream Apply externally BID prn sx up to 2 wks   FARXIGA 10 MG TABS tablet TAKE 1 TABLET(10 MG) BY MOUTH DAILY BEFORE BREAKFAST   fluticasone (FLONASE) 50 MCG/ACT nasal spray SPRAY 2 SPRAYS INTO EACH NOSTRIL EVERY DAY   glipiZIDE (GLUCOTROL XL) 5 MG 24 hr tablet TAKE 1 TABLET BY MOUTH EVERY DAY WITH BREAKFAST   JANUVIA  100 MG tablet TAKE 1 TABLET(100 MG) BY MOUTH DAILY   meloxicam (MOBIC) 7.5 MG tablet Take 1-2 tablets (7.5-15 mg total) by mouth daily as needed for pain.   omeprazole (PRILOSEC) 20 MG capsule TAKE 1 CAPSULE BY MOUTH EVERY DAY   PARoxetine (PAXIL) 40 MG tablet TAKE 1 TABLET BY MOUTH EVERY DAY   traZODone (DESYREL) 100 MG tablet TAKE 1/2 TO 1 TABLET BY MOUTH AT BEDTIME   gabapentin (NEURONTIN) 300 MG capsule TAKE 1 CAPSULE BY MOUTH EVERYDAY AT BEDTIME (Patient not taking: Reported on 11/24/2021)   linaclotide (LINZESS) 72 MCG capsule Take 1 capsule (72 mcg total) by mouth daily before breakfast.   lovastatin (MEVACOR) 20 MG tablet Take 1 tablet (20 mg total) by mouth daily.   metFORMIN (GLUCOPHAGE-XR) 500 MG 24 hr tablet TAKE 2 TABLETS BY MOUTH TWICE A DAY    No facility-administered medications prior to visit.         Objective      Vitals:   11/24/21 1423 11/24/21 1500  BP: (!) 140/78 136/74  Pulse:    Resp:    Temp:    SpO2:      Physical Exam    General: Appearance:    Well developed, well nourished female in no acute distress  Eyes:    PERRL, conjunctiva/corneas clear, EOM's intact       Lungs:     Diffuse expiratory wheezing, respirations unlabored  Heart:    Normal heart rate. Normal rhythm. No murmurs, rubs, or gallops.    MS:   All extremities are intact.  Tender across mid and lateral lower back, no gross deformities.   Neurologic:   Awake, alert, oriented x 3. No apparent focal neurological defect. Mild tremor on intention, no cogwheeling. Tremor not observed at rest.         Results for orders placed or performed in visit on 11/24/21  POCT glycosylated hemoglobin (Hb A1C)  Result Value Ref Range   Hemoglobin A1C 7.6 (A) 4.0 - 5.6 %   Est. average glucose Bld gHb Est-mCnc 171   POCT Urinalysis Dipstick  Result Value Ref Range   Color, UA normal    Clarity, UA clear    Glucose, UA Positive (A) Negative   Bilirubin, UA neg    Ketones, UA neg    Spec Grav, UA  1.010 1.010 - 1.025   Blood, UA trace    pH, UA 7.5 5.0 - 8.0   Protein, UA Positive (A) Negative   Urobilinogen, UA negative (A) 0.2 or 1.0 E.U./dL   Nitrite, UA neg    Leukocytes, UA Negative Negative   Appearance     Odor normal     Assessment & Plan     1. Diabetes mellitus without complication ([place]) Improving. Continue current medications.  Recheck A1c and annual labs at follow up in 4 months.   uACR collected and sent today.   2. Urinary frequency No sign of UTI on u/a  3. Chronic constipation Failed low dose Linzess. Try  lubiprostone (AMITIZA) 24 MCG capsule; Take 1 capsule (24 mcg  total) by mouth 2 (two) times daily with a meal.  Dispense: 30 capsule; Refill: 4  4. Chronic midline low back pain without sciatica Cyclobenzaprine not effective. Try  naproxen (NAPROSYN) 500 MG tablet; Take 1 tablet (500 mg total) by mouth 2 (two) times daily with a meal.  Dispense: 30 tablet; Refill: 2  5. Essential tremor She states she does have resting tremor at home, but not seen today. Will continue to observe for now. Consider medication for neuro if this progresses or becomes more bothersome. .   6. Hyperlipidemia, unspecified hyperlipidemia type She is tolerating lovastatin well with no adverse effects.  . - lovastatin (MEVACOR) 20 MG tablet; Take 1 tablet (20 mg total) by mouth daily.  Dispense: 90 tablet; Refill: 4    Addressed extensive list of chronic and acute medical problems today requiring 45 minutes reviewing her medical record, counseling patient regarding her conditions and coordination of care.        The entirety of the information documented in the History of Present Illness, Review of Systems and Physical Exam were personally obtained by me. Portions of this information were initially documented by the CMA and reviewed by me for thoroughness and accuracy.     Lelon Huh, MD  [facility] [phone] (phone) [phone] (fax)  [place]

## 2021-12-02 ENCOUNTER — Other Ambulatory Visit: Payer: Self-pay | Admitting: Family Medicine

## 2021-12-02 DIAGNOSIS — K5909 Other constipation: Secondary | ICD-10-CM

## 2021-12-03 NOTE — Telephone Encounter (Signed)
Called pharmacy to clarify if medication picked up by patient. Pharmacy staff reports refill picked up on 11/24/21 by patient . Staff reports insurance requires #90.

## 2021-12-03 NOTE — Telephone Encounter (Signed)
Called pharmacy to verify additional request for refill. Pharmacy staff reports patient already picked up Rx 11/24/21. Reports insurance requesting #90. Requested Prescriptions  Refused Prescriptions Disp Refills  . lubiprostone (AMITIZA) 24 MCG capsule [Pharmacy Med Name: LUBIPROSTONE 24 MCG CAPSULE] 30 capsule 4    Sig: TAKE 1 CAPSULE (24 MCG TOTAL) BY MOUTH 2 (TWO) TIMES DAILY WITH A MEAL.     Gastroenterology: Irritable Bowel Syndrome - lubiprostone Passed - 12/02/2021  1:30 PM      Passed - AST in normal range and within 360 days    AST  Date Value Ref Range Status  04/06/2021 18 0 - 40 IU/L Final         Passed - ALT in normal range and within 360 days    ALT  Date Value Ref Range Status  04/06/2021 27 0 - 32 IU/L Final         Passed - Valid encounter within last 12 months    Recent Outpatient Visits          1 week ago Diabetes mellitus without complication [facility])   [facility] Malva Limes, MD   4 months ago Diabetes mellitus without complication [facility])   [facility] Malva Limes, MD   5 months ago Acute non-recurrent frontal sinusitis   [facility] Malva Limes, MD   9 months ago Diabetes mellitus without complication [facility])   [facility] Malva Limes, MD   1 year ago Diabetes mellitus without complication [facility])   [facility] Malva Limes, MD      Future Appointments            In 3 months Fisher, Demetrios Isaacs, MD [facility]

## 2021-12-18 ENCOUNTER — Ambulatory Visit: Payer: BC Managed Care – PPO | Admitting: Family Medicine

## 2021-12-27 ENCOUNTER — Other Ambulatory Visit: Payer: Self-pay | Admitting: Family Medicine

## 2021-12-27 DIAGNOSIS — E119 Type 2 diabetes mellitus without complications: Secondary | ICD-10-CM

## 2022-01-01 ENCOUNTER — Other Ambulatory Visit: Payer: Self-pay | Admitting: Family Medicine

## 2022-01-01 DIAGNOSIS — K5909 Other constipation: Secondary | ICD-10-CM

## 2022-01-01 NOTE — Telephone Encounter (Signed)
Requested medications are due for refill today.  unsure  Requested medications are on the active medications list.  yes  Last refill. 11/24/2021 #30 4 rf  Future visit scheduled.   yes  Notes to clinic.  Dx code needed. PT requesting 90 day supply.    Requested Prescriptions  Pending Prescriptions Disp Refills   lubiprostone (AMITIZA) 24 MCG capsule [Pharmacy Med Name: LUBIPROSTONE 24 MCG CAPSULE] 180 capsule 1    Sig: Take 1 capsule (24 mcg total) by mouth 2 (two) times daily with a meal.     Gastroenterology: Irritable Bowel Syndrome - lubiprostone Passed - 01/01/2022  2:30 PM      Passed - AST in normal range and within 360 days    AST  Date Value Ref Range Status  04/06/2021 18 0 - 40 IU/L Final         Passed - ALT in normal range and within 360 days    ALT  Date Value Ref Range Status  04/06/2021 27 0 - 32 IU/L Final         Passed - Valid encounter within last 12 months    Recent Outpatient Visits           1 month ago Diabetes mellitus without complication [facility])   [facility] Birdie Sons, MD   5 months ago Diabetes mellitus without complication [facility])   [facility] Birdie Sons, MD   6 months ago Acute non-recurrent frontal sinusitis   [facility] Birdie Sons, MD   10 months ago Diabetes mellitus without complication [facility])   [facility] Birdie Sons, MD   1 year ago Diabetes mellitus without complication [facility])   [facility] Birdie Sons, MD       Future Appointments             In 2 months Fisher, Kirstie Peri, MD [facility] [place]

## 2022-01-15 ENCOUNTER — Ambulatory Visit: Payer: BC Managed Care – PPO | Admitting: Physician Assistant

## 2022-01-15 ENCOUNTER — Encounter: Payer: Self-pay | Admitting: Physician Assistant

## 2022-01-15 VITALS — BP 147/76 | HR 87 | Temp 98.6°F | Wt 113.1 lb

## 2022-01-15 DIAGNOSIS — J439 Emphysema, unspecified: Secondary | ICD-10-CM

## 2022-01-15 MED ORDER — DOXYCYCLINE HYCLATE 100 MG PO TABS: 100.0000 mg | ORAL_TABLET | Freq: Two times a day (BID) | ORAL | 0 refills | Status: DC

## 2022-01-15 MED ORDER — METHYLPREDNISOLONE 4 MG PO TBPK: ORAL_TABLET | ORAL | 0 refills | Status: DC

## 2022-01-15 NOTE — Progress Notes (Signed)
I,Sha'taria Tyson,acting as a Education administrator for [employer], PA-C.,have documented all relevant documentation on the behalf of Mikey Kirschner, PA-C,as directed by  Mikey Kirschner, PA-C while in the presence of Mikey Kirschner, PA-C.   Established patient visit   Patient: Dana Weber   DOB: 1960/07/14   61 y.o. Female  MRN: [id number] Visit Date: 01/15/2022  Today's healthcare provider: Mikey Kirschner, PA-C   Cc. Cough, chest congestion x 1 week  Subjective     Pt reports a a productive cough w/ dark green, brown mucous started 1 week ago, with nasal congestion, ear fluid, chest congestion, fatigue. She has been using her albuterol inhaler TID. She is taking OTC corcidin, mucinex. Pt is a long-time smoker, hx/o emphysema seen on LDCT.  Medications: Outpatient Medications Prior to Visit  Medication Sig   albuterol (VENTOLIN HFA) 108 (90 Base) MCG/ACT inhaler INHALE 2 PUFFS BY MOUTH EVERY 6 HOURS AS NEEDED FOR WHEEZING OR SHORTNESS OF BREATH   aspirin EC 81 MG tablet Take 81 mg by mouth daily. Swallow whole.   benazepril (LOTENSIN) 5 MG tablet Take 1 tablet (5 mg total) by mouth daily.   CINNAMON PO Take by mouth.   clotrimazole-betamethasone (LOTRISONE) cream Apply externally BID prn sx up to 2 wks   cyclobenzaprine (FLEXERIL) 5 MG tablet Take 1-2 tablets (5-10 mg total) by mouth 3 (three) times daily as needed (shoulder pain).   FARXIGA 10 MG TABS tablet TAKE 1 TABLET BY MOUTH DAILY BEFORE BREAKFAST   fluticasone (FLONASE) 50 MCG/ACT nasal spray SPRAY 2 SPRAYS INTO EACH NOSTRIL EVERY DAY   glipiZIDE (GLUCOTROL XL) 5 MG 24 hr tablet TAKE 1 TABLET BY MOUTH EVERY DAY WITH BREAKFAST   JANUVIA 100 MG tablet TAKE 1 TABLET(100 MG) BY MOUTH DAILY   lovastatin (MEVACOR) 20 MG tablet Take 1 tablet (20 mg total) by mouth daily.   meloxicam (MOBIC) 7.5 MG tablet Take 1-2 tablets (7.5-15 mg total) by mouth daily as needed for pain.   metFORMIN (GLUCOPHAGE-XR) 500 MG 24 hr tablet TAKE 2  TABLETS BY MOUTH TWICE A DAY   naproxen (NAPROSYN) 500 MG tablet Take 1 tablet (500 mg total) by mouth 2 (two) times daily with a meal.   omeprazole (PRILOSEC) 20 MG capsule TAKE 1 CAPSULE BY MOUTH EVERY DAY   PARoxetine (PAXIL) 40 MG tablet TAKE 1 TABLET BY MOUTH EVERY DAY   traZODone (DESYREL) 100 MG tablet TAKE 1/2 TO 1 TABLET BY MOUTH AT BEDTIME   lubiprostone (AMITIZA) 24 MCG capsule TAKE 1 CAPSULE (24 MCG TOTAL) BY MOUTH 2 (TWO) TIMES DAILY WITH A MEAL. (Patient not taking: Reported on 01/15/2022)   No facility-administered medications prior to visit.    Review of Systems  HENT:  Positive for postnasal drip, rhinorrhea and sore throat.   Respiratory:  Positive for cough and wheezing.      Objective    Blood pressure (!) 147/76, pulse 87, temperature 98.6 F (37 C), temperature source Oral, weight 113 lb 1.6 oz (51.3 kg), SpO2 97 %.   Physical Exam Constitutional:      General: She is awake.     Appearance: She is well-developed.  HENT:     Head: Normocephalic.  Eyes:     Conjunctiva/sclera: Conjunctivae normal.  Cardiovascular:     Rate and Rhythm: Normal rate and regular rhythm.     Heart sounds: Normal heart sounds.  Pulmonary:     Effort: Pulmonary effort is normal.     Breath sounds:  Normal breath sounds.  Skin:    General: Skin is warm.  Neurological:     Mental Status: She is alert and oriented to person, place, and time.  Psychiatric:        Attention and Perception: Attention normal.        Mood and Affect: Mood normal.        Speech: Speech normal.        Behavior: Behavior is cooperative.     No results found for any visits on 01/15/22.  Assessment & Plan     COPD/ emphysema exacerbation No dx or pfts, but pt uses inhaler frequently, smoking history, and emphysema seen on LDCT Continue fluids, mucinex otc Rx doxycycline bid x 10 days, medrol dose pack. Pt has pcn allergy  Advised if no improvement, call office.  Return if symptoms worsen or fail  to improve.      I, Mikey Kirschner, PA-C have reviewed all documentation for this visit. The documentation on  01/15/2022  for the exam, diagnosis, procedures, and orders are all accurate and complete.  Mikey Kirschner, PA-C [facility] [address] [place], [place], [address] Office: [phone] Fax: [phone]

## 2022-01-18 ENCOUNTER — Encounter (INDEPENDENT_AMBULATORY_CARE_PROVIDER_SITE_OTHER): Payer: Self-pay

## 2022-01-29 ENCOUNTER — Encounter: Payer: Self-pay | Admitting: Physician Assistant

## 2022-01-29 ENCOUNTER — Ambulatory Visit: Payer: BC Managed Care – PPO | Admitting: Physician Assistant

## 2022-01-29 VITALS — BP 171/82 | HR 96 | Resp 18 | Wt 113.0 lb

## 2022-01-29 DIAGNOSIS — J441 Chronic obstructive pulmonary disease with (acute) exacerbation: Secondary | ICD-10-CM | POA: Insufficient documentation

## 2022-01-29 DIAGNOSIS — J439 Emphysema, unspecified: Secondary | ICD-10-CM

## 2022-01-29 MED ORDER — PREDNISONE 10 MG PO TABS: ORAL_TABLET | ORAL | 0 refills | Status: AC

## 2022-01-29 MED ORDER — LEVOFLOXACIN 500 MG PO TABS: 500.0000 mg | ORAL_TABLET | Freq: Every day | ORAL | 0 refills | Status: AC

## 2022-01-29 MED ORDER — TRELEGY ELLIPTA 100-62.5-25 MCG/ACT IN AEPB: 1.0000 | INHALATION_SPRAY | Freq: Every day | RESPIRATORY_TRACT | 0 refills | Status: DC

## 2022-01-29 NOTE — Assessment & Plan Note (Signed)
Rx levaquin x 7 days. Rx another 7 day prednisone taper. Advised albuterol inhaler q 4 hr PRN. Gave sample of trelegy in office today. Advised she will likely need this going forward, if she feels better on it we can add it as a prescription.  Discussed chest xray, pt declines, and just wheezing on exam. Pt advised to call office if not improved.

## 2022-01-29 NOTE — Progress Notes (Signed)
I,April Miller,acting as a Neurosurgeon for [employer], PA-C.,have documented all relevant documentation on the behalf of Alfredia Ferguson, PA-C,as directed by  Alfredia Ferguson, PA-C while in the presence of Alfredia Ferguson, PA-C.   Established patient visit   Patient: Dana Weber   DOB: Feb 21, 1961   61 y.o. Female  MRN: [id number] Visit Date: 01/29/2022  Today's healthcare provider: Alfredia Ferguson, PA-C   Chief Complaint  Patient presents with   Cough   Subjective    HPI  Follow up for COPD/ emphysema exacerbation:  The patient was last seen for this 2 weeks ago. Changes made at last visit include; patient was given doxycycline and methyl prednisone.  ----------------------------------------------------------------------------------------- Patient completed medications. However patient states her symptoms have worsened. She is using her albuterol inhaler 2-3 times a day and is SOB. Denies fevers.  Medications: Outpatient Medications Prior to Visit  Medication Sig   albuterol (VENTOLIN HFA) 108 (90 Base) MCG/ACT inhaler INHALE 2 PUFFS BY MOUTH EVERY 6 HOURS AS NEEDED FOR WHEEZING OR SHORTNESS OF BREATH   aspirin EC 81 MG tablet Take 81 mg by mouth daily. Swallow whole.   benazepril (LOTENSIN) 5 MG tablet Take 1 tablet (5 mg total) by mouth daily.   CINNAMON PO Take by mouth.   clotrimazole-betamethasone (LOTRISONE) cream Apply externally BID prn sx up to 2 wks   cyclobenzaprine (FLEXERIL) 5 MG tablet Take 1-2 tablets (5-10 mg total) by mouth 3 (three) times daily as needed (shoulder pain).   FARXIGA 10 MG TABS tablet TAKE 1 TABLET BY MOUTH DAILY BEFORE BREAKFAST   fluticasone (FLONASE) 50 MCG/ACT nasal spray SPRAY 2 SPRAYS INTO EACH NOSTRIL EVERY DAY   glipiZIDE (GLUCOTROL XL) 5 MG 24 hr tablet TAKE 1 TABLET BY MOUTH EVERY DAY WITH BREAKFAST   JANUVIA 100 MG tablet TAKE 1 TABLET(100 MG) BY MOUTH DAILY   lovastatin (MEVACOR) 20 MG tablet Take 1 tablet (20 mg total) by  mouth daily.   meloxicam (MOBIC) 7.5 MG tablet Take 1-2 tablets (7.5-15 mg total) by mouth daily as needed for pain.   metFORMIN (GLUCOPHAGE-XR) 500 MG 24 hr tablet TAKE 2 TABLETS BY MOUTH TWICE A DAY   naproxen (NAPROSYN) 500 MG tablet Take 1 tablet (500 mg total) by mouth 2 (two) times daily with a meal.   omeprazole (PRILOSEC) 20 MG capsule TAKE 1 CAPSULE BY MOUTH EVERY DAY   PARoxetine (PAXIL) 40 MG tablet TAKE 1 TABLET BY MOUTH EVERY DAY   traZODone (DESYREL) 100 MG tablet TAKE 1/2 TO 1 TABLET BY MOUTH AT BEDTIME   doxycycline (VIBRA-TABS) 100 MG tablet Take 1 tablet (100 mg total) by mouth 2 (two) times daily. (Patient not taking: Reported on 01/29/2022)   [DISCONTINUED] lubiprostone (AMITIZA) 24 MCG capsule TAKE 1 CAPSULE (24 MCG TOTAL) BY MOUTH 2 (TWO) TIMES DAILY WITH A MEAL. (Patient not taking: Reported on 01/15/2022)   [DISCONTINUED] methylPREDNISolone (MEDROL DOSEPAK) 4 MG TBPK tablet Take 6 pills on day 1, 5 pills on day 2, 4 pills on day 3, 3 pills on day 4, 2 pills on day 5, and 1 pill on day 6 (Patient not taking: Reported on 01/29/2022)   No facility-administered medications prior to visit.    Review of Systems  Constitutional:  Negative for appetite change, chills, fatigue and fever.  Respiratory:  Negative for chest tightness and shortness of breath.   Cardiovascular:  Negative for chest pain and palpitations.  Gastrointestinal:  Negative for abdominal pain, nausea and vomiting.  Neurological:  Negative for dizziness and weakness.     Objective    BP (!) 171/82 (BP Location: Right Arm, Patient Position: Sitting, Cuff Size: Normal)   Pulse 96   Resp 18   Wt 113 lb (51.3 kg)   SpO2 98%   BMI 22.07 kg/m   Physical Exam Constitutional:      General: She is awake.     Appearance: She is well-developed.  HENT:     Head: Normocephalic.  Eyes:     Conjunctiva/sclera: Conjunctivae normal.  Cardiovascular:     Rate and Rhythm: Tachycardia present.  Pulmonary:      Effort: Pulmonary effort is normal. No respiratory distress.     Breath sounds: Wheezing present.  Skin:    General: Skin is warm.  Neurological:     Mental Status: She is alert and oriented to person, place, and time.  Psychiatric:        Attention and Perception: Attention normal.        Mood and Affect: Mood normal.        Speech: Speech normal.        Behavior: Behavior is cooperative.     No results found for any visits on 01/29/22.  Assessment & Plan     Problem List Items Addressed This Visit       Respiratory   Acute exacerbation of emphysema (HCC) - Primary    Rx levaquin x 7 days. Rx another 7 day prednisone taper. Advised albuterol inhaler q 4 hr PRN. Gave sample of trelegy in office today. Advised she will likely need this going forward, if she feels better on it we can add it as a prescription.  Discussed chest xray, pt declines, and just wheezing on exam. Pt advised to call office if not improved.      Relevant Medications   levofloxacin (LEVAQUIN) 500 MG tablet   predniSONE (DELTASONE) 10 MG tablet     Return if symptoms worsen or fail to improve.      I, Alfredia Ferguson, PA-C have reviewed all documentation for this visit. The documentation on  01/29/2022  for the exam, diagnosis, procedures, and orders are all accurate and complete.  Alfredia Ferguson, PA-C [facility] [address] [place], [place], [address] Office: [phone] Fax: [phone]   [facility]

## 2022-02-01 ENCOUNTER — Telehealth: Payer: Self-pay

## 2022-02-01 NOTE — Telephone Encounter (Signed)
Copied from CRM [id number]. Topic: General - Other >> Feb 01, 2022 10:22 AM Macon Large wrote: Reason for CRM: Pt requests that an order for a chest x-ray be placed as well as an order for a RSV test. Cb# [phone]

## 2022-02-02 ENCOUNTER — Ambulatory Visit
Admission: RE | Admit: 2022-02-02 | Discharge: 2022-02-02 | Disposition: A | Discharge status: Home / Self Care | Payer: BC Managed Care – PPO | Source: Ambulatory Visit | Attending: Physician Assistant | Admitting: Physician Assistant

## 2022-02-02 ENCOUNTER — Ambulatory Visit
Admission: RE | Admit: 2022-02-02 | Discharge: 2022-02-02 | Disposition: A | Discharge status: Home / Self Care | Payer: BC Managed Care – PPO | Attending: Physician Assistant | Admitting: Physician Assistant

## 2022-02-02 ENCOUNTER — Other Ambulatory Visit: Payer: Self-pay | Admitting: Physician Assistant

## 2022-02-02 ENCOUNTER — Encounter: Payer: Self-pay | Admitting: Physician Assistant

## 2022-02-02 DIAGNOSIS — J439 Emphysema, unspecified: Secondary | ICD-10-CM

## 2022-02-02 NOTE — Telephone Encounter (Signed)
Answered in result management

## 2022-02-23 ENCOUNTER — Other Ambulatory Visit: Payer: Self-pay | Admitting: Family Medicine

## 2022-02-23 DIAGNOSIS — I10 Essential (primary) hypertension: Secondary | ICD-10-CM

## 2022-02-23 NOTE — Telephone Encounter (Signed)
Requested medication (s) are due for refill today: yes  Requested medication (s) are on the active medication list: yes  Last refill:  09/07/21 #90 1 RF  Future visit scheduled: yes  Notes to clinic:  overdue lab work   Requested Prescriptions  Pending Prescriptions Disp Refills   benazepril (LOTENSIN) 5 MG tablet [Pharmacy Med Name: BENAZEPRIL HCL 5 MG TABLET] 90 tablet 1    Sig: Take 1 tablet (5 mg total) by mouth daily.     Cardiovascular:  ACE Inhibitors Failed - 02/23/2022  1:10 AM      Failed - Cr in normal range and within 180 days    Creatinine, Ser  Date Value Ref Range Status  04/06/2021 0.64 0.57 - 1.00 mg/dL Final   Creatinine, POC  Date Value Ref Range Status  03/13/2018 n/a mg/dL Final         Failed - K in normal range and within 180 days    Potassium  Date Value Ref Range Status  04/06/2021 5.0 3.5 - 5.2 mmol/L Final         Failed - Last BP in normal range    BP Readings from Last 1 Encounters:  01/29/22 (!) 171/82         Passed - Patient is not pregnant      Passed - Valid encounter within last 6 months    Recent Outpatient Visits           3 weeks ago Acute exacerbation of emphysema (HCC)   [employer], [place], PA-C   1 month ago Acute exacerbation of emphysema (HCC)    [facility] Alfredia Ferguson, PA-C   3 months ago Diabetes mellitus without complication [facility])   [facility] Malva Limes, MD   7 months ago Diabetes mellitus without complication [facility])   [facility] Malva Limes, MD   8 months ago Acute non-recurrent frontal sinusitis   [facility] Malva Limes, MD       Future Appointments             In 1 month Fisher, Demetrios Isaacs, MD [facility]

## 2022-03-05 ENCOUNTER — Other Ambulatory Visit: Payer: Self-pay | Admitting: Family Medicine

## 2022-03-05 DIAGNOSIS — J069 Acute upper respiratory infection, unspecified: Secondary | ICD-10-CM

## 2022-03-05 NOTE — Telephone Encounter (Signed)
Requested Prescriptions  Pending Prescriptions Disp Refills   albuterol (VENTOLIN HFA) 108 (90 Base) MCG/ACT inhaler [Pharmacy Med Name: ALBUTEROL HFA INH (200 PUFFS) 8.5GM] 8.5 g 5    Sig: INHALE 2 PUFFS BY MOUTH EVERY 6 HOURS AS NEEDED FOR WHEEZING OR SHORTNESS OF BREATH     Pulmonology:  Beta Agonists 2 Failed - 03/05/2022  5:21 PM      Failed - Last BP in normal range    BP Readings from Last 1 Encounters:  01/29/22 (!) 171/82         Passed - Last Heart Rate in normal range    Pulse Readings from Last 1 Encounters:  01/29/22 96         Passed - Valid encounter within last 12 months    Recent Outpatient Visits           1 month ago Acute exacerbation of emphysema (HCC)   [facility] [place], [place], PA-C   1 month ago Acute exacerbation of emphysema (HCC)   [facility] [place], [place], PA-C   3 months ago Diabetes mellitus without complication [facility])   [facility] Malva Limes, MD   7 months ago Diabetes mellitus without complication [facility])   [facility] Malva Limes, MD   8 months ago Acute non-recurrent frontal sinusitis   [facility] Malva Limes, MD       Future Appointments             In 3 weeks Fisher, Demetrios Isaacs, MD [facility]

## 2022-03-26 ENCOUNTER — Ambulatory Visit: Payer: BC Managed Care – PPO | Admitting: Family Medicine

## 2022-03-26 ENCOUNTER — Encounter: Payer: Self-pay | Admitting: Family Medicine

## 2022-03-26 VITALS — BP 159/79 | HR 75 | Wt 115.9 lb

## 2022-03-26 DIAGNOSIS — E559 Vitamin D deficiency, unspecified: Secondary | ICD-10-CM

## 2022-03-26 DIAGNOSIS — I10 Essential (primary) hypertension: Secondary | ICD-10-CM

## 2022-03-26 DIAGNOSIS — E119 Type 2 diabetes mellitus without complications: Secondary | ICD-10-CM

## 2022-03-26 DIAGNOSIS — R0789 Other chest pain: Secondary | ICD-10-CM

## 2022-03-26 DIAGNOSIS — E785 Hyperlipidemia, unspecified: Secondary | ICD-10-CM

## 2022-03-26 DIAGNOSIS — R5383 Other fatigue: Secondary | ICD-10-CM

## 2022-03-26 DIAGNOSIS — R0600 Dyspnea, unspecified: Secondary | ICD-10-CM | POA: Diagnosis not present

## 2022-03-26 DIAGNOSIS — G4700 Insomnia, unspecified: Secondary | ICD-10-CM

## 2022-03-26 MED ORDER — SPIRIVA RESPIMAT 2.5 MCG/ACT IN AERS: 2.0000 | INHALATION_SPRAY | Freq: Every day | RESPIRATORY_TRACT | 3 refills | Status: DC

## 2022-03-26 NOTE — Progress Notes (Signed)
I,Sha'taria Tyson,acting as a Education administrator for Lelon Huh, MD.,have documented all relevant documentation on the behalf of Lelon Huh, MD,as directed by  Lelon Huh, MD while in the presence of Lelon Huh, MD.   Established patient visit   Patient: Dana Weber   DOB: 07-12-1960   62 y.o. Female  MRN: [id number] Visit Date: 03/26/2022  Today's healthcare provider: Lelon Huh, MD    Subjective    HPI  Diabetes Mellitus Type II, Follow-up  Lab Results  Component Value Date   HGBA1C 7.6 (A) 11/24/2021   HGBA1C 8.0 (A) 07/27/2021   HGBA1C 8.0 (A) 02/24/2021   Wt Readings from Last 3 Encounters:  01/29/22 113 lb (51.3 kg)  01/15/22 113 lb 1.6 oz (51.3 kg)  11/24/21 119 lb 9.6 oz (54.3 kg)   Last seen for diabetes 4 months ago.  Management since then includes continue current medication. She reports excellent compliance with treatment. She is not having side effects.  Symptoms: Yes fatigue No foot ulcerations  No appetite changes Yes nausea  Yes paresthesia of the feet  No polydipsia  No polyuria No visual disturbances   No vomiting     Home blood sugar records: trend: fluctuating a lot  Episodes of hypoglycemia? Yes shakes, feels hungry   Current insulin regiment: none Most Recent Eye Exam: 2023 (records request sent via fax) Current exercise: none Current diet habits: well balanced  Pertinent Labs: Lab Results  Component Value Date   CHOL 165 04/06/2021   HDL 47 04/06/2021   LDLCALC 102 (H) 04/06/2021   TRIG 87 04/06/2021   CHOLHDL 3.5 04/06/2021   Lab Results  Component Value Date   NA 143 04/06/2021   K 5.0 04/06/2021   CREATININE 0.64 04/06/2021   EGFR 102 04/06/2021   MICROALBUR 20 03/13/2018     ---------------------------------------------------------------------------------------------------  She does complain of fatigue and having more trouble sleeping recently.   Has also been having pressure in her chest a few times a week  that lasts a few minutes.   Medications: Outpatient Medications Prior to Visit  Medication Sig   albuterol (VENTOLIN HFA) 108 (90 Base) MCG/ACT inhaler INHALE 2 PUFFS BY MOUTH EVERY 6 HOURS AS NEEDED FOR WHEEZING OR SHORTNESS OF BREATH   aspirin EC 81 MG tablet Take 81 mg by mouth daily. Swallow whole.   benazepril (LOTENSIN) 5 MG tablet TAKE 1 TABLET (5 MG TOTAL) BY MOUTH DAILY.   CINNAMON PO Take by mouth.   clotrimazole-betamethasone (LOTRISONE) cream Apply externally BID prn sx up to 2 wks   cyclobenzaprine (FLEXERIL) 5 MG tablet Take 1-2 tablets (5-10 mg total) by mouth 3 (three) times daily as needed (shoulder pain).   doxycycline (VIBRA-TABS) 100 MG tablet Take 1 tablet (100 mg total) by mouth 2 (two) times daily. (Patient not taking: Reported on 01/29/2022)   FARXIGA 10 MG TABS tablet TAKE 1 TABLET BY MOUTH DAILY BEFORE BREAKFAST   fluticasone (FLONASE) 50 MCG/ACT nasal spray SPRAY 2 SPRAYS INTO EACH NOSTRIL EVERY DAY   Fluticasone-Umeclidin-Vilant (TRELEGY ELLIPTA) 100-62.5-25 MCG/ACT AEPB Inhale 1 puff into the lungs daily.   glipiZIDE (GLUCOTROL XL) 5 MG 24 hr tablet TAKE 1 TABLET BY MOUTH EVERY DAY WITH BREAKFAST   JANUVIA 100 MG tablet TAKE 1 TABLET(100 MG) BY MOUTH DAILY   lovastatin (MEVACOR) 20 MG tablet Take 1 tablet (20 mg total) by mouth daily.   meloxicam (MOBIC) 7.5 MG tablet Take 1-2 tablets (7.5-15 mg total) by mouth daily as needed for  pain.   metFORMIN (GLUCOPHAGE-XR) 500 MG 24 hr tablet TAKE 2 TABLETS BY MOUTH TWICE A DAY   naproxen (NAPROSYN) 500 MG tablet Take 1 tablet (500 mg total) by mouth 2 (two) times daily with a meal.   omeprazole (PRILOSEC) 20 MG capsule TAKE 1 CAPSULE BY MOUTH EVERY DAY   PARoxetine (PAXIL) 40 MG tablet TAKE 1 TABLET BY MOUTH EVERY DAY   traZODone (DESYREL) 100 MG tablet TAKE 1/2 TO 1 TABLET BY MOUTH AT BEDTIME   No facility-administered medications prior to visit.    Review of Systems  Constitutional:  Positive for diaphoresis and  fatigue. Negative for appetite change, chills and fever.  Respiratory:  Positive for chest tightness and shortness of breath.   Cardiovascular:  Negative for chest pain and palpitations.  Gastrointestinal:  Negative for abdominal pain, nausea and vomiting.  Neurological:  Negative for dizziness and weakness.      Objective    BP (!) 159/79 (BP Location: Left Arm, Patient Position: Sitting, Cuff Size: Normal)   Pulse 75   Wt 115 lb 14.4 oz (52.6 kg)   SpO2 100%   BMI 22.64 kg/m    Physical Exam   General: Appearance:    Well developed, well nourished female in no acute distress  Eyes:    PERRL, conjunctiva/corneas clear, EOM's intact       Lungs:     Clear to auscultation bilaterally, respirations unlabored  Heart:    Normal heart rate. Normal rhythm. No murmurs, rubs, or gallops.    MS:   All extremities are intact.    Neurologic:   Awake, alert, oriented x 3. No apparent focal neurological defect.        Assessment & Plan     1. Diabetes mellitus without complication (HCC)  - Urine Microalbumin w/creat. ratio - HgB A1c  She is interested in trying  2. Dyspnea, unspecified type Did well Trelegy prescription from last visit but she stopped since her brother who was also taking  - Brain natriuretic peptide - Tiotropium Bromide Monohydrate (SPIRIVA RESPIMAT) 2.5 MCG/ACT AERS; Inhale 2 puffs into the lungs daily.  Dispense: 4 g; Refill: 3  3. Avitaminosis D  - VITAMIN D 25 Hydroxy (Vit-D Deficiency, Fractures)  4. Other fatigue  - TSH  5. Essential (primary) hypertension Doing well current medications although BP not quite at goal.   6. Hyperlipidemia, unspecified hyperlipidemia type She is tolerating lovastatin well with no adverse effects.   - CBC - Comprehensive metabolic panel - Lipid panel  7. Chest discomfort Not typical cardiac pain but she does have multiple cardiac risk factores.  - Ambulatory referral to Cardiology  8. Insomnia, unspecified type         The entirety of the information documented in the History of Present Illness, Review of Systems and Physical Exam were personally obtained by me. Portions of this information were initially documented by the CMA and reviewed by me for thoroughness and accuracy.     Lelon Huh, MD  [facility] [phone] (phone) [phone] (fax)  [place]

## 2022-03-27 LAB — MICROALBUMIN / CREATININE URINE RATIO
Creatinine, Urine: 58.6 mg/dL
Microalb/Creat Ratio: 173 mg/g creat — ABNORMAL HIGH (ref 0–29)
Microalbumin, Urine: 101.2 ug/mL

## 2022-03-27 LAB — SPECIMEN STATUS REPORT

## 2022-03-29 LAB — COMPREHENSIVE METABOLIC PANEL
ALT: 22 IU/L (ref 0–32)
AST: 15 IU/L (ref 0–40)
Albumin/Globulin Ratio: 1.9 (ref 1.2–2.2)
Albumin: 4.3 g/dL (ref 3.8–4.9)
Alkaline Phosphatase: 71 IU/L (ref 44–121)
BUN/Creatinine Ratio: 21 (ref 12–28)
BUN: 12 mg/dL (ref 8–27)
Bilirubin Total: 0.2 mg/dL (ref 0.0–1.2)
CO2: 22 mmol/L (ref 20–29)
Calcium: 9.2 mg/dL (ref 8.7–10.3)
Chloride: 100 mmol/L (ref 96–106)
Creatinine, Ser: 0.57 mg/dL (ref 0.57–1.00)
Globulin, Total: 2.3 g/dL (ref 1.5–4.5)
Glucose: 111 mg/dL — ABNORMAL HIGH (ref 70–99)
Potassium: 3.9 mmol/L (ref 3.5–5.2)
Sodium: 139 mmol/L (ref 134–144)
Total Protein: 6.6 g/dL (ref 6.0–8.5)
eGFR: 104 mL/min/{1.73_m2} (ref >59)

## 2022-03-29 LAB — LIPID PANEL
Chol/HDL Ratio: 3.1 ratio (ref 0.0–4.4)
Cholesterol, Total: 163 mg/dL (ref 100–199)
HDL: 53 mg/dL (ref >39)
LDL Chol Calc (NIH): 95 mg/dL (ref 0–99)
Triglycerides: 80 mg/dL (ref 0–149)
VLDL Cholesterol Cal: 15 mg/dL (ref 5–40)

## 2022-03-29 LAB — CBC
Hematocrit: 41 % (ref 34.0–46.6)
Hemoglobin: 14.2 g/dL (ref 11.1–15.9)
MCH: 30.8 pg (ref 26.6–33.0)
MCHC: 34.6 g/dL (ref 31.5–35.7)
MCV: 89 fL (ref 79–97)
Platelets: 248 10*3/uL (ref 150–450)
RBC: 4.61 x10E6/uL (ref 3.77–5.28)
RDW: 11.9 % (ref 11.7–15.4)
WBC: 6.9 10*3/uL (ref 3.4–10.8)

## 2022-03-29 LAB — HEMOGLOBIN A1C
Est. average glucose Bld gHb Est-mCnc: 206 mg/dL
Hgb A1c MFr Bld: 8.8 % — ABNORMAL HIGH (ref 4.8–5.6)

## 2022-03-29 LAB — BRAIN NATRIURETIC PEPTIDE: BNP: 8.8 pg/mL (ref 0.0–100.0)

## 2022-03-29 LAB — TSH: TSH: 1.38 u[IU]/mL (ref 0.450–4.500)

## 2022-03-29 LAB — VITAMIN D 25 HYDROXY (VIT D DEFICIENCY, FRACTURES): Vit D, 25-Hydroxy: 50.4 ng/mL (ref 30.0–100.0)

## 2022-03-30 ENCOUNTER — Telehealth: Payer: Self-pay

## 2022-03-30 NOTE — Telephone Encounter (Signed)
Copied from [place] [id number]. Topic: General - Inquiry >> Mar 29, 2022  3:15 PM Dana Weber P wrote: Pt wants to hear about her lab results from a nurse.  She has seen them but wants Dr. Sabino Snipes opinion.  CB#  [phone]

## 2022-04-23 ENCOUNTER — Ambulatory Visit
Admission: RE | Admit: 2022-04-23 | Discharge: 2022-04-23 | Disposition: A | Discharge status: Home / Self Care | Payer: BC Managed Care – PPO | Source: Ambulatory Visit | Attending: Acute Care | Admitting: Acute Care

## 2022-04-23 DIAGNOSIS — F17210 Nicotine dependence, cigarettes, uncomplicated: Secondary | ICD-10-CM | POA: Diagnosis present

## 2022-04-23 DIAGNOSIS — Z87891 Personal history of nicotine dependence: Secondary | ICD-10-CM | POA: Diagnosis present

## 2022-04-26 ENCOUNTER — Other Ambulatory Visit: Payer: Self-pay | Admitting: Family Medicine

## 2022-04-26 DIAGNOSIS — I10 Essential (primary) hypertension: Secondary | ICD-10-CM

## 2022-04-26 DIAGNOSIS — E119 Type 2 diabetes mellitus without complications: Secondary | ICD-10-CM

## 2022-04-27 ENCOUNTER — Other Ambulatory Visit: Payer: Self-pay

## 2022-04-27 ENCOUNTER — Other Ambulatory Visit: Payer: Self-pay | Admitting: Family Medicine

## 2022-04-27 DIAGNOSIS — F17210 Nicotine dependence, cigarettes, uncomplicated: Secondary | ICD-10-CM

## 2022-04-27 DIAGNOSIS — I10 Essential (primary) hypertension: Secondary | ICD-10-CM

## 2022-04-27 DIAGNOSIS — Z87891 Personal history of nicotine dependence: Secondary | ICD-10-CM

## 2022-04-27 NOTE — Telephone Encounter (Signed)
Benazepril last RF 02/25/22 #90 1 RF Glipizide last RF 05/04/21 #90 4 RF  Requested Prescriptions  Refused Prescriptions Disp Refills   benazepril (LOTENSIN) 5 MG tablet [Pharmacy Med Name: BENAZEPRIL HCL 5 MG TABLET] 90 tablet 1    Sig: TAKE 1 TABLET (5 MG TOTAL) BY MOUTH DAILY.     Cardiovascular:  ACE Inhibitors Failed - 04/26/2022  5:21 PM      Failed - Last BP in normal range    BP Readings from Last 1 Encounters:  03/26/22 (!) 159/79         Passed - Cr in normal range and within 180 days    Creatinine, Ser  Date Value Ref Range Status  03/26/2022 0.57 0.57 - 1.00 mg/dL Final   Creatinine, POC  Date Value Ref Range Status  03/13/2018 n/a mg/dL Final         Passed - K in normal range and within 180 days    Potassium  Date Value Ref Range Status  03/26/2022 3.9 3.5 - 5.2 mmol/L Final         Passed - Patient is not pregnant      Passed - Valid encounter within last 6 months    Recent Outpatient Visits           1 month ago Diabetes mellitus without complication ([place])   [place] Birdie Sons, MD   2 months ago Acute exacerbation of emphysema ([place])   [place] Mikey Kirschner, PA-C   3 months ago Acute exacerbation of emphysema ([place])   [place] Mikey Kirschner, PA-C   5 months ago Diabetes mellitus without complication [facility])   [place] Birdie Sons, MD   9 months ago Diabetes mellitus without complication [facility])   [place] Birdie Sons, MD       Future Appointments             In 3 weeks [name], Kathlene November, MD [place] [facility]             glipiZIDE (GLUCOTROL XL) 5 MG 24 hr tablet [Pharmacy Med Name: GLIPIZIDE ER 5 MG TABLET] 90 tablet 4    Sig: TAKE 1 TABLET BY [place]     Endocrinology:  Diabetes - Sulfonylureas Failed - 04/26/2022  5:21 PM      Failed -  HBA1C is between 0 and 7.9 and within 180 days    Hemoglobin A1C  Date Value Ref Range Status  04/30/2016 7.5  Final   Hgb A1c MFr Bld  Date Value Ref Range Status  03/26/2022 8.8 (H) 4.8 - 5.6 % Final    Comment:             Prediabetes: 5.7 - 6.4          Diabetes: >6.4          Glycemic control for adults with diabetes: <7.0          Passed - Cr in normal range and within 360 days    Creatinine, Ser  Date Value Ref Range Status  03/26/2022 0.57 0.57 - 1.00 mg/dL Final   Creatinine, POC  Date Value Ref Range Status  03/13/2018 n/a mg/dL Final         Passed - Valid encounter within last 6 months    Recent Outpatient Visits  1 month ago Diabetes mellitus without complication [facility])   [place] Birdie Sons, MD   2 months ago Acute exacerbation of emphysema [facility])   [place] Mikey Kirschner, PA-C   3 months ago Acute exacerbation of emphysema [facility])   [place] Mikey Kirschner, PA-C   5 months ago Diabetes mellitus without complication [facility])   [place] Birdie Sons, MD   9 months ago Diabetes mellitus without complication [facility])   [place], MD       Future Appointments             In 3 weeks [name], Kathlene November, MD [place] at [facility]

## 2022-04-29 ENCOUNTER — Telehealth: Payer: Self-pay | Admitting: Family Medicine

## 2022-04-29 NOTE — Telephone Encounter (Signed)
Medication Refill - Medication: Rybelsus (says she was given a month's worth of a sample)   Has the patient contacted their pharmacy? Yes.   (Agent: If no, request that the patient contact the pharmacy for the refill. If patient does not wish to contact the pharmacy document the reason why and proceed with request.) (Agent: If yes, when and what did the pharmacy advise?)  Preferred Pharmacy (with phone number or street name):  [facility] [address], [address]  [place] [address]  Phone: [phone] Fax: [phone]   Has the patient been seen for an appointment in the last year OR does the patient have an upcoming appointment? Yes.    Agent: Please be advised that RX refills may take up to 3 business days. We ask that you follow-up with your pharmacy.

## 2022-04-30 ENCOUNTER — Other Ambulatory Visit: Payer: Self-pay | Admitting: Family Medicine

## 2022-05-03 MED ORDER — RYBELSUS 3 MG PO TABS: 3.0000 mg | ORAL_TABLET | Freq: Every day | ORAL | 0 refills | Status: DC

## 2022-05-03 NOTE — Addendum Note (Signed)
Addended by: Birdie Sons on: 05/03/2022 04:56 PM   Modules accepted: Orders

## 2022-05-03 NOTE — Telephone Encounter (Signed)
Pt called this morning saying she does not have any medication left.    Please advise [phone]

## 2022-05-04 ENCOUNTER — Encounter: Payer: Self-pay | Admitting: Family Medicine

## 2022-05-04 DIAGNOSIS — J432 Centrilobular emphysema: Secondary | ICD-10-CM | POA: Insufficient documentation

## 2022-05-04 DIAGNOSIS — I700 Atherosclerosis of aorta: Secondary | ICD-10-CM | POA: Insufficient documentation

## 2022-05-09 ENCOUNTER — Other Ambulatory Visit: Payer: Self-pay | Admitting: Family Medicine

## 2022-05-09 DIAGNOSIS — E119 Type 2 diabetes mellitus without complications: Secondary | ICD-10-CM

## 2022-05-11 ENCOUNTER — Encounter (INDEPENDENT_AMBULATORY_CARE_PROVIDER_SITE_OTHER): Payer: Self-pay | Admitting: Nurse Practitioner

## 2022-05-11 ENCOUNTER — Ambulatory Visit (INDEPENDENT_AMBULATORY_CARE_PROVIDER_SITE_OTHER): Payer: BC Managed Care – PPO | Admitting: Nurse Practitioner

## 2022-05-11 ENCOUNTER — Ambulatory Visit (INDEPENDENT_AMBULATORY_CARE_PROVIDER_SITE_OTHER): Payer: BC Managed Care – PPO

## 2022-05-11 VITALS — BP 145/76 | HR 76 | Resp 18 | Ht 60.0 in | Wt 110.0 lb

## 2022-05-11 DIAGNOSIS — I10 Essential (primary) hypertension: Secondary | ICD-10-CM

## 2022-05-11 DIAGNOSIS — I6522 Occlusion and stenosis of left carotid artery: Secondary | ICD-10-CM

## 2022-05-11 DIAGNOSIS — E119 Type 2 diabetes mellitus without complications: Secondary | ICD-10-CM

## 2022-05-11 DIAGNOSIS — M79605 Pain in left leg: Secondary | ICD-10-CM

## 2022-05-11 DIAGNOSIS — I739 Peripheral vascular disease, unspecified: Secondary | ICD-10-CM | POA: Diagnosis not present

## 2022-05-11 DIAGNOSIS — M79604 Pain in right leg: Secondary | ICD-10-CM

## 2022-05-12 ENCOUNTER — Encounter (INDEPENDENT_AMBULATORY_CARE_PROVIDER_SITE_OTHER): Payer: Self-pay | Admitting: Nurse Practitioner

## 2022-05-12 LAB — VAS US ABI WITH/WO TBI
Left ABI: 1.01
Right ABI: 1.02

## 2022-05-12 NOTE — Progress Notes (Signed)
Subjective:    Patient ID: Dana Weber, female    DOB: [birth year], 61 y.o.   MRN: [id number] Chief Complaint  Patient presents with   Follow-up    followup in 1 year with abi and carotid     The patient is seen for evaluation of carotid stenosis. The carotid stenosis was identified after duplex ultrasound was obtained for a left carotid bruit.   The patient denies amaurosis fugax. There is no recent history of TIA symptoms or focal motor deficits. There is no prior documented CVA.   There is no history of migraine headaches. There is no history of seizures.   The patient is also followed for PAD.  The patient has had noted pain in her lower extremities.  The patient however is in her thighs and is present whether she is active or not.  She notes it is a aching sensation.  She specifically denies claudication-like symptoms.  She notes the pain is largely in her thighs not in her calves or feet or toes.  She denies numbness or tingling.  She endorses having lower back pain.     There is a history of hyperlipidemia which is being treated with a statin.    Duplex ultrasound of the carotid arteries obtained today shows some worsening with 60 to 79% of the left ICA with 1 to 39% on the right ICA   ABI 1.02=Rt and 1.01=Lt with previous ABI Rt=0.99 and Lt=1.05   The patient also has good triphasic tibial artery waveforms bilaterally with normal toe waveforms bilaterally.    Review of Systems     Objective:   Physical Exam  BP (!) 145/76 (BP Location: Right Arm)   Pulse 76   Resp 18   Ht 5' (1.524 m)   Wt 110 lb (49.9 kg)   BMI 21.48 kg/m   Past Medical History:  Diagnosis Date   Carotid artery stenosis    Diabetes mellitus without complication (HCC)    Fatty liver    Headache    sinus   Hyperlipidemia    Hypertension    Multilevel degenerative disc disease    "spurs" on spine also   Neuropathy    hands and feet   Vertigo    mild - daily - positional   Wears  dentures    partial lower    Social History   Socioeconomic History   Marital status: Married    Spouse name: Not on file   Number of children: Not on file   Years of education: Not on file   Highest education level: Not on file  Occupational History   Not on file  Tobacco Use   Smoking status: Some Days    Packs/day: 0.25    Years: 42.00    Total pack years: 10.50    Types: Cigarettes   Smokeless tobacco: Never   Tobacco comments:    has a cigarette every few days  Vaping Use   Vaping Use: Former  Substance and Sexual Activity   Alcohol use: Yes    Alcohol/week: 0.0 standard drinks of alcohol    Comment: OCCASIONALLY   Drug use: No   Sexual activity: Yes    Birth control/protection: Surgical    Comment: hysterectomy   Other Topics Concern   Not on file  Social History Narrative   Not on file   Social Determinants of Health   Financial Resource Strain: Not on file  Food Insecurity: Not on file  Transportation Needs:  Not on file  Physical Activity: Not on file  Stress: Not on file  Social Connections: Not on file  Intimate Partner Violence: Not on file    Past Surgical History:  Procedure Laterality Date   ABDOMINAL HYSTERECTOMY     CESAREAN SECTION     COLONOSCOPY WITH PROPOFOL N/A 03/20/2015   Procedure: COLONOSCOPY WITH PROPOFOL;  Surgeon: Lucilla Lame, MD;  Location: [place];  Service: Endoscopy;  Laterality: N/A;   COLONOSCOPY WITH PROPOFOL N/A 11/14/2020   Procedure: COLONOSCOPY WITH PROPOFOL;  Surgeon: Lucilla Lame, MD;  Location: [place];  Service: Endoscopy;  Laterality: N/A;  Diabetic - oral meds   DILATION AND CURETTAGE OF UTERUS     ESOPHAGOGASTRODUODENOSCOPY (EGD) WITH PROPOFOL N/A 03/20/2015   Procedure: ESOPHAGOGASTRODUODENOSCOPY (EGD) WITH PROPOFOL;  Surgeon: Lucilla Lame, MD;  Location: [place];  Service: Endoscopy;  Laterality: N/A;  Diabetic - oral meds   HEMORRHOID SURGERY     LAPAROSCOPIC CHOLECYSTECTOMY      LAPAROSCOPIC SUPRACERVICAL HYSTERECTOMY  2008   SALPINGOOPHORECTOMY Left 2008   TONSILLECTOMY AND ADENOIDECTOMY  1960    Family History  Problem Relation Age of Onset   Diabetes Mother    Hypertension Mother    Diabetes Father    Hypertension Father    Hypertension Brother    Diabetes Paternal Grandmother    Emphysema Paternal Grandfather    Hypertension Brother     Allergies  Allergen Reactions   Hydrocodone Swelling   Penicillins Swelling   Amoxicillin Swelling    Facial, and itching   Hydrocodone Bitartrate Er Swelling   Wellbutrin [Bupropion]     Makes her mean       Latest Ref Rng & Units 03/26/2022    4:27 PM 04/06/2021    8:44 AM 08/13/2020    9:37 AM  CBC  WBC 3.4 - 10.8 x10E3/uL 6.9  6.2  8.6   Hemoglobin 11.1 - 15.9 g/dL 14.2  16.1  15.0   Hematocrit 34.0 - 46.6 % 41.0  46.6  44.5   Platelets 150 - 450 x10E3/uL 248  300  294       CMP     Component Value Date/Time   NA 139 03/26/2022 1627   K 3.9 03/26/2022 1627   CL 100 03/26/2022 1627   CO2 22 03/26/2022 1627   GLUCOSE 111 (H) 03/26/2022 1627   GLUCOSE 207 (H) 04/16/2020 2156   BUN 12 03/26/2022 1627   CREATININE 0.57 03/26/2022 1627   CALCIUM 9.2 03/26/2022 1627   PROT 6.6 03/26/2022 1627   ALBUMIN 4.3 03/26/2022 1627   AST 15 03/26/2022 1627   ALT 22 03/26/2022 1627   ALKPHOS 71 03/26/2022 1627   BILITOT 0.2 03/26/2022 1627   GFRNONAA >60 04/16/2020 2156   GFRAA 106 05/18/2019 0828     No results found.     Assessment & Plan:   1. Carotid artery stenosis, asymptomatic, left Recommend:  Given the patient's asymptomatic subcritical stenosis no further invasive testing or surgery at this time.  Duplex ultrasound shows 60 to 79% left ICA stenosis with an 1 to 39% right ICA stenosis  Continue antiplatelet therapy as prescribed Continue management of CAD, HTN and Hyperlipidemia Healthy heart diet,  encouraged exercise at least 4 times per week Follow up in 6 months with duplex  ultrasound and physical exam   2. PAD (peripheral artery disease) (HCC)  Recommend:  The patient has evidence of atherosclerosis of the lower extremities with no claudication.  The  patient does not voice lifestyle limiting changes at this point in time.  Noninvasive studies do not suggest clinically significant change.  No invasive studies, angiography or surgery at this time The patient should continue walking and begin a more formal exercise program.  The patient should continue antiplatelet therapy and aggressive treatment of the lipid abnormalities  No changes in the patient's medications at this time  Continued surveillance is indicated as atherosclerosis is likely to progress with time.    The patient will continue follow up with noninvasive studies as ordered.   3. Essential (primary) hypertension Continue antihypertensive medications as already ordered, these medications have been reviewed and there are no changes at this time.  4. Diabetes mellitus without complication ([place]) Continue hypoglycemic medications as already ordered, these medications have been reviewed and there are no changes at this time.  Hgb A1C to be monitored as already arranged by primary service  5. Pain in both lower extremities Today the patient's noninvasive studies have not worsened over the last year.  Based on her description of pain in her thighs, I suspect this is much more musculoskeletal than vascular.  She also has lower back pain.  Following discussion we will refer the patient to [facility] spine center for further workup and evaluation for possible spinal causes.  Given that the patient is diabetic if this is unrevealing recommendation will be made for evaluation of possible neuropathy. - Ambulatory referral to Neurosurgery   Current Outpatient Medications on File Prior to Visit  Medication Sig Dispense Refill   albuterol (VENTOLIN HFA) 108 (90 Base) MCG/ACT inhaler INHALE 2 PUFFS BY MOUTH EVERY 6  HOURS AS NEEDED FOR WHEEZING OR SHORTNESS OF BREATH 8.5 g 5   aspirin EC 81 MG tablet Take 81 mg by mouth daily. Swallow whole.     benazepril (LOTENSIN) 5 MG tablet TAKE 1 TABLET (5 MG TOTAL) BY MOUTH DAILY. 90 tablet 1   FARXIGA 10 MG TABS tablet TAKE 1 TABLET BY MOUTH DAILY BEFORE BREAKFAST 90 tablet 4   fluticasone (FLONASE) 50 MCG/ACT nasal spray SPRAY 2 SPRAYS INTO EACH NOSTRIL EVERY DAY 48 mL 2   glipiZIDE (GLUCOTROL XL) 5 MG 24 hr tablet TAKE 1 TABLET BY MOUTH EVERY DAY WITH BREAKFAST 90 tablet 4   lovastatin (MEVACOR) 20 MG tablet Take 1 tablet (20 mg total) by mouth daily. 90 tablet 4   meloxicam (MOBIC) 7.5 MG tablet Take 1-2 tablets (7.5-15 mg total) by mouth daily as needed for pain. 30 tablet 4   metFORMIN (GLUCOPHAGE-XR) 500 MG 24 hr tablet TAKE 2 TABLETS BY MOUTH TWICE A DAY 360 tablet 4   naproxen (NAPROSYN) 500 MG tablet Take 1 tablet (500 mg total) by mouth 2 (two) times daily with a meal. 30 tablet 2   omeprazole (PRILOSEC) 20 MG capsule TAKE 1 CAPSULE BY MOUTH EVERY DAY 90 capsule 5   PARoxetine (PAXIL) 40 MG tablet TAKE 1 TABLET BY MOUTH EVERY DAY 90 tablet 4   Semaglutide (RYBELSUS) 3 MG TABS Take 1 tablet (3 mg total) by mouth daily. 30 tablet 0   Tiotropium Bromide Monohydrate (SPIRIVA RESPIMAT) 2.5 MCG/ACT AERS Inhale 2 puffs into the lungs daily. 4 g 3   traZODone (DESYREL) 100 MG tablet TAKE 1/2 TO 1 TABLET BY MOUTH AT BEDTIME 90 tablet 0   JANUVIA 100 MG tablet TAKE 1 TABLET(100 MG) BY MOUTH DAILY (Patient not taking: Reported on 05/11/2022) 90 tablet 4   No current facility-administered medications on file prior to visit.  There are no Patient Instructions on file for this visit. No follow-ups on file.   Kris Hartmann, NP

## 2022-05-19 ENCOUNTER — Encounter: Payer: Self-pay | Admitting: Cardiovascular Disease

## 2022-05-19 ENCOUNTER — Ambulatory Visit: Payer: BC Managed Care – PPO | Attending: Cardiovascular Disease | Admitting: Cardiovascular Disease

## 2022-05-19 VITALS — BP 138/72 | HR 91 | Ht 60.0 in | Wt 110.0 lb

## 2022-05-19 DIAGNOSIS — I493 Ventricular premature depolarization: Secondary | ICD-10-CM

## 2022-05-19 DIAGNOSIS — R079 Chest pain, unspecified: Secondary | ICD-10-CM | POA: Diagnosis not present

## 2022-05-19 DIAGNOSIS — I209 Angina pectoris, unspecified: Secondary | ICD-10-CM | POA: Diagnosis not present

## 2022-05-19 DIAGNOSIS — I700 Atherosclerosis of aorta: Secondary | ICD-10-CM

## 2022-05-19 MED ORDER — METOPROLOL TARTRATE 100 MG PO TABS: ORAL_TABLET | ORAL | 0 refills | Status: DC

## 2022-05-19 MED ORDER — EZETIMIBE 10 MG PO TABS: 10.0000 mg | ORAL_TABLET | Freq: Every day | ORAL | 3 refills | Status: DC

## 2022-05-19 MED ORDER — IVABRADINE HCL 5 MG PO TABS: ORAL_TABLET | ORAL | 0 refills | Status: DC

## 2022-05-19 NOTE — Progress Notes (Signed)
Cardiology Office Note  Date:  05/19/2022   ID:  [name], Dana Weber, MRN [id number]  PCP:  Birdie Sons, MD   Chief Complaint  Patient presents with   New Patient (Initial Visit)    Ref by Dr. Caryn Section for chest discomfort & has carotid artery stenosis. Patient c/o irregular heartbeats, chest pain that radiates into her jaw and ear and through to her back. Medications reviewed by the patient verbally.     HPI:  [name] is a 62 year old woman with past medical history of PAD, carotid stenosis Moderate aortic atherosclerosis Minimal coronary calcification Hypertension Diabetes smoker Who presents by referral from Lelon Huh for consultation of her chest pain, aortic atherosclerosis  In follow-up today reports that she is longtime smoker, diabetic On numerous medications for her diabetes but has struggled to get A1c down  Reports having some nagging chest pain, into back in neck and ear  Cardiovascular imaging reviewed CT scan chest with moderate aortic atherosclerosis, coronary calcification also noted  Duplex ultrasound of the carotid arteries  60 to 79% of the left ICA with 1 to 39% on the right ICA  ABI 1.02=Rt and 1.01=Lt with previous ABI Rt=0.99 and Lt=1.05    Lab work reviewed A1c 8.8 Total cholesterol 163 LDL 95  In terms of her smoking, has previously tried Chantix, husband is a smoker, finding it difficult to quit  EKG personally reviewed by myself on todays visit Normal sinus rhythm rate 91 bpm no significant ST-T wave changes  PMH:   has a past medical history of Carotid artery stenosis, Diabetes mellitus without complication ([place]), Fatty liver, Headache, Hyperlipidemia, Hypertension, Multilevel degenerative disc disease, Neuropathy, Vertigo, and Wears dentures.  PSH:    Past Surgical History:  Procedure Laterality Date   ABDOMINAL HYSTERECTOMY     CESAREAN SECTION     COLONOSCOPY WITH PROPOFOL N/A 03/20/2015   Procedure:  COLONOSCOPY WITH PROPOFOL;  Surgeon: Lucilla Lame, MD;  Location: [facility];  Service: Endoscopy;  Laterality: N/A;   COLONOSCOPY WITH PROPOFOL N/A 11/14/2020   Procedure: COLONOSCOPY WITH PROPOFOL;  Surgeon: Lucilla Lame, MD;  Location: [facility];  Service: Endoscopy;  Laterality: N/A;  Diabetic - oral meds   DILATION AND CURETTAGE OF UTERUS     ESOPHAGOGASTRODUODENOSCOPY (EGD) WITH PROPOFOL N/A 03/20/2015   Procedure: ESOPHAGOGASTRODUODENOSCOPY (EGD) WITH PROPOFOL;  Surgeon: Lucilla Lame, MD;  Location: [facility];  Service: Endoscopy;  Laterality: N/A;  Diabetic - oral meds   HEMORRHOID SURGERY     LAPAROSCOPIC CHOLECYSTECTOMY     LAPAROSCOPIC SUPRACERVICAL HYSTERECTOMY  2008   SALPINGOOPHORECTOMY Left 2008   TONSILLECTOMY AND ADENOIDECTOMY  1960    Current Outpatient Medications  Medication Sig Dispense Refill   albuterol (VENTOLIN HFA) 108 (90 Base) MCG/ACT inhaler INHALE 2 PUFFS BY MOUTH EVERY 6 HOURS AS NEEDED FOR WHEEZING OR SHORTNESS OF BREATH 8.5 g 5   aspirin EC 81 MG tablet Take 81 mg by mouth daily. Swallow whole.     benazepril (LOTENSIN) 5 MG tablet TAKE 1 TABLET (5 MG TOTAL) BY MOUTH DAILY. 90 tablet 1   ezetimibe (ZETIA) 10 MG tablet Take 1 tablet (10 mg total) by mouth daily. 90 tablet 3   FARXIGA 10 MG TABS tablet TAKE 1 TABLET BY MOUTH DAILY BEFORE BREAKFAST 90 tablet 4   fluticasone (FLONASE) 50 MCG/ACT nasal spray SPRAY 2 SPRAYS INTO EACH NOSTRIL EVERY DAY 48 mL 2   glipiZIDE (GLUCOTROL XL) 5 MG 24 hr tablet TAKE  1 TABLET BY MOUTH EVERY DAY WITH BREAKFAST 90 tablet 4   ivabradine (CORLANOR) 5 MG TABS tablet Take 3 tablets by mouth once 2 hours before CTA. 3 tablet 0   lovastatin (MEVACOR) 20 MG tablet Take 1 tablet (20 mg total) by mouth daily. 90 tablet 4   meloxicam (MOBIC) 7.5 MG tablet Take 1-2 tablets (7.5-15 mg total) by mouth daily as needed for pain. 30 tablet 4   metFORMIN (GLUCOPHAGE-XR) 500 MG 24 hr tablet TAKE 2 TABLETS BY MOUTH  TWICE A DAY 360 tablet 4   metoprolol tartrate (LOPRESSOR) 100 MG tablet Take 1 tablet by mouth once 2 hours before CTA. 1 tablet 0   naproxen (NAPROSYN) 500 MG tablet Take 1 tablet (500 mg total) by mouth 2 (two) times daily with a meal. 30 tablet 2   omeprazole (PRILOSEC) 20 MG capsule TAKE 1 CAPSULE BY MOUTH EVERY DAY 90 capsule 5   PARoxetine (PAXIL) 40 MG tablet TAKE 1 TABLET BY MOUTH EVERY DAY 90 tablet 4   Semaglutide (RYBELSUS) 3 MG TABS Take 1 tablet (3 mg total) by mouth daily. 30 tablet 0   Tiotropium Bromide Monohydrate (SPIRIVA RESPIMAT) 2.5 MCG/ACT AERS Inhale 2 puffs into the lungs daily. 4 g 3   traZODone (DESYREL) 100 MG tablet TAKE 1/2 TO 1 TABLET BY MOUTH AT BEDTIME 90 tablet 0   No current facility-administered medications for this visit.    Allergies:   Hydrocodone, Penicillins, Amoxicillin, Hydrocodone bitartrate er, and Wellbutrin [bupropion]   Social History:  The patient  reports that she has been smoking cigarettes. She has a 10.50 pack-year smoking history. She has never used smokeless tobacco. She reports current alcohol use. She reports that she does not use drugs.   Family History:   family history includes Diabetes in her father, mother, and paternal grandmother; Emphysema in her paternal grandfather; Hypertension in her brother, brother, father, and mother.    Review of Systems: Review of Systems  Constitutional: Negative.   HENT: Negative.    Respiratory: Negative.    Cardiovascular: Negative.   Gastrointestinal: Negative.   Musculoskeletal: Negative.   Neurological: Negative.   Psychiatric/Behavioral: Negative.    All other systems reviewed and are negative.    PHYSICAL EXAM: VS:  BP 138/72 (BP Location: Right Arm, Patient Position: Sitting, Cuff Size: Normal)   Pulse 91   Ht 5' (1.524 m)   Wt 110 lb (49.9 kg)   SpO2 97%   BMI 21.48 kg/m  , BMI Body mass index is 21.48 kg/m. GEN: Well nourished, well developed, in no acute distress HEENT:  normal Neck: no JVD, carotid bruits, or masses Cardiac: RRR; no murmurs, rubs, or gallops,no edema  Respiratory:  clear to auscultation bilaterally, normal work of breathing GI: soft, nontender, nondistended, + BS MS: no deformity or atrophy Skin: warm and dry, no rash Neuro:  Strength and sensation are intact Psych: euthymic mood, full affect  Recent Labs: 03/26/2022: ALT 22; BNP 8.8; BUN 12; Creatinine, Ser 0.57; Hemoglobin 14.2; Platelets 248; Potassium 3.9; Sodium 139; TSH 1.380    Lipid Panel Lab Results  Component Value Date   CHOL 163 03/26/2022   HDL 53 03/26/2022   LDLCALC 95 03/26/2022   TRIG 80 03/26/2022      Wt Readings from Last 3 Encounters:  05/19/22 110 lb (49.9 kg)  05/11/22 110 lb (49.9 kg)  03/26/22 115 lb 14.4 oz (52.6 kg)       ASSESSMENT AND PLAN:  Problem List Items Addressed  This Visit       Cardiology Problems   Aortic atherosclerosis (HCC) - Primary   Relevant Medications   ezetimibe (ZETIA) 10 MG tablet   metoprolol tartrate (LOPRESSOR) 100 MG tablet   ivabradine (CORLANOR) 5 MG TABS tablet   Other Relevant Orders   EKG [id number]   Basic metabolic panel   Frequent PVCs   Relevant Medications   ezetimibe (ZETIA) 10 MG tablet   metoprolol tartrate (LOPRESSOR) 100 MG tablet   ivabradine (CORLANOR) 5 MG TABS tablet   Other Relevant Orders   Basic metabolic panel   Other Visit Diagnoses     Angina pectoris (HCC)       Relevant Medications   ezetimibe (ZETIA) 10 MG tablet   metoprolol tartrate (LOPRESSOR) 100 MG tablet   ivabradine (CORLANOR) 5 MG TABS tablet   Other Relevant Orders   EKG [id number]   Basic metabolic panel   Chest pain, unspecified type       Relevant Orders   Basic metabolic panel   CT CORONARY MORPH W/CTA COR W/SCORE W/CA W/CM &/OR WO/CM      Chest pain/angina Several risk factors for underlying coronary disease including long smoking history, diabetes, hyperlipidemia We have recommended cardiac CTA for further  evaluation Given elevated heart rate will need premedication with metoprolol and Corlanor  Hyperlipidemia Numbers improved with statin, recommend she add Zetia 10 mg daily to achieve goal LDL less than 70  Smoking We have encouraged her to continue to work on weaning her cigarettes and smoking cessation. She will continue to work on this and does not want any assistance with chantix.    Aortic atherosclerosis Moderate disease in the setting of smoking, diabetes, hyperlipidemia We have recommended smoking cessation, aggressive lipid control, aggressive management of diabetes  Diabetes type 2 with complications Significant PAD noted with aortic atherosclerosis, carotid disease, coronary calcification Stressed the importance of working closely with Dr. Caryn Section to get A1c down  Carotid stenosis Estimated at 60 to 79% on the left, followed by vascular Stressed importance of smoking cessation, aggressive diabetes control Changes as above for more aggressive lipid management   Total encounter time more than 60 minutes  Greater than 50% was spent in counseling and coordination of care with the patient  Patient seen in consultation for Dr. Caryn Section and will be referred back to his office for ongoing care of the issues detailed above  Signed, Esmond Plants, M.D., Ph.D. [place], [place]

## 2022-05-19 NOTE — Patient Instructions (Addendum)
Medication Instructions:  Please start zetia 10 mg daily  If you need a refill on your cardiac medications before your next appointment, please call your pharmacy.   Lab work:  Your physician recommends that you return for lab work in 2 weeks at [facility] for BMP.  Monday- Friday 7:00 am- 6:00 am   Testing/Procedures:   Your cardiac CTA for angina will be scheduled at one of the below locations on June 14, 2022 @ 8:00 am.    [facility] [place], [place] [address] [phone]   If scheduled at [facility], please arrive 15 mins early for check-in and test prep.   Please follow these instructions carefully (unless otherwise directed):   On the Night Before the Test: Be sure to Drink plenty of water. Do not consume any caffeinated/decaffeinated beverages or chocolate 12 hours prior to your test. Do not take any antihistamines 12 hours prior to your test.  On the Day of the Test: Drink plenty of water until 1 hour prior to the test. Do not eat any food 1 hour prior to test. You may take your regular medications prior to the test.  Take metoprolol (Lopressor)/ Corlanor (Ivabradine) two hours prior to test.  FEMALES- please wear underwire-free bra if available, avoid dresses & tight clothing       After the Test: Drink plenty of water. After receiving IV contrast, you may experience a mild flushed feeling. This is normal. On occasion, you may experience a mild rash up to 24 hours after the test. This is not dangerous. If this occurs, you can take Benadryl 25 mg and increase your fluid intake. If you experience trouble breathing, this can be serious. If it is severe call 911 IMMEDIATELY. If it is mild, please call our office. If you take any of these medications: Glipizide/Metformin, Avandament, Glucavance, please do not take 48 hours after completing test unless otherwise instructed.  Follow-Up: At  [facility], you and your health needs are our priority.  As part of our continuing mission to provide you with exceptional heart care, we have created designated Provider Care Teams.  These Care Teams include your primary Cardiologist (physician) and Advanced Practice Providers (APPs -  Physician Assistants and Nurse Practitioners) who all work together to provide you with the care you need, when you need it.  You will need a follow up appointment in 12 months  Providers on your designated Care Team:   Murray Hodgkins, NP Christell Faith, PA-C Cadence Kathlen Mody, [place]  COVID-19 Vaccine Information can be found at: [email or web address].uk For questions related to vaccine distribution or appointments, please email [email or web address] or call [phone].

## 2022-05-25 ENCOUNTER — Other Ambulatory Visit: Payer: Self-pay | Admitting: Family Medicine

## 2022-05-25 MED ORDER — RYBELSUS 7 MG PO TABS: 7.0000 mg | ORAL_TABLET | Freq: Every day | ORAL | 2 refills | Status: DC

## 2022-06-01 ENCOUNTER — Other Ambulatory Visit
Admission: RE | Admit: 2022-06-01 | Discharge: 2022-06-01 | Disposition: A | Discharge status: Home / Self Care | Payer: BC Managed Care – PPO | Source: Ambulatory Visit | Attending: Cardiovascular Disease | Admitting: Cardiovascular Disease

## 2022-06-01 ENCOUNTER — Other Ambulatory Visit: Payer: Self-pay | Admitting: Family Medicine

## 2022-06-01 DIAGNOSIS — I209 Angina pectoris, unspecified: Secondary | ICD-10-CM | POA: Insufficient documentation

## 2022-06-01 DIAGNOSIS — R079 Chest pain, unspecified: Secondary | ICD-10-CM | POA: Insufficient documentation

## 2022-06-01 DIAGNOSIS — I700 Atherosclerosis of aorta: Secondary | ICD-10-CM | POA: Insufficient documentation

## 2022-06-01 DIAGNOSIS — I493 Ventricular premature depolarization: Secondary | ICD-10-CM | POA: Diagnosis present

## 2022-06-01 LAB — BASIC METABOLIC PANEL
Anion gap: 11 (ref 5–15)
BUN: 12 mg/dL (ref 8–23)
CO2: 24 mmol/L (ref 22–32)
Calcium: 9.1 mg/dL (ref 8.9–10.3)
Chloride: 102 mmol/L (ref 98–111)
Creatinine, Ser: 0.63 mg/dL (ref 0.44–1.00)
GFR, Estimated: >60 mL/min (ref >60)
Glucose, Bld: 121 mg/dL — ABNORMAL HIGH (ref 70–99)
Potassium: 3.7 mmol/L (ref 3.5–5.1)
Sodium: 137 mmol/L (ref 135–145)

## 2022-06-01 NOTE — Telephone Encounter (Signed)
Requested Prescriptions  Pending Prescriptions Disp Refills   metFORMIN (GLUCOPHAGE-XR) 500 MG 24 hr tablet [Pharmacy Med Name: METFORMIN HCL ER 500 MG TABLET] 360 tablet 4    Sig: TAKE 2 TABLETS BY MOUTH TWICE A DAY     Endocrinology:  Diabetes - Biguanides Failed - 06/01/2022  1:46 AM      Failed - HBA1C is between 0 and 7.9 and within 180 days    Hemoglobin A1C  Date Value Ref Range Status  04/30/2016 7.5  Final   Hgb A1c MFr Bld  Date Value Ref Range Status  03/26/2022 8.8 (H) 4.8 - 5.6 % Final    Comment:             Prediabetes: 5.7 - 6.4          Diabetes: >6.4          Glycemic control for adults with diabetes: <7.0          Failed - CBC within normal limits and completed in the last 12 months    WBC  Date Value Ref Range Status  03/26/2022 6.9 3.4 - 10.8 x10E3/uL Final  04/16/2020 8.1 4.0 - 10.5 K/uL Final   RBC  Date Value Ref Range Status  03/26/2022 4.61 3.77 - 5.28 x10E6/uL Final  04/16/2020 4.64 3.87 - 5.11 MIL/uL Final   Hemoglobin  Date Value Ref Range Status  03/26/2022 14.2 11.1 - 15.9 g/dL Final   Hematocrit  Date Value Ref Range Status  03/26/2022 41.0 34.0 - 46.6 % Final   MCHC  Date Value Ref Range Status  03/26/2022 34.6 31.5 - 35.7 g/dL Final  04/16/2020 34.3 30.0 - 36.0 g/dL Final   [facility]  Date Value Ref Range Status  03/26/2022 30.8 26.6 - 33.0 pg Final  04/16/2020 31.0 26.0 - 34.0 pg Final   MCV  Date Value Ref Range Status  03/26/2022 89 79 - 97 fL Final   No results found for: "PLTCOUNTKUC", "LABPLAT", "POCPLA" RDW  Date Value Ref Range Status  03/26/2022 11.9 11.7 - 15.4 % Final         Passed - Cr in normal range and within 360 days    Creatinine, Ser  Date Value Ref Range Status  03/26/2022 0.57 0.57 - 1.00 mg/dL Final   Creatinine, POC  Date Value Ref Range Status  03/13/2018 n/a mg/dL Final         Passed - eGFR in normal range and within 360 days    GFR calc Af Amer  Date Value Ref Range Status  05/18/2019 106  >59 mL/min/1.73 Final   GFR, Estimated  Date Value Ref Range Status  04/16/2020 >60 >60 mL/min Final    Comment:    (NOTE) Calculated using the CKD-EPI Creatinine Equation (2021)    eGFR  Date Value Ref Range Status  03/26/2022 104 >59 mL/min/1.73 Final         Passed - B12 Level in normal range and within 720 days    Vitamin B-12  Date Value Ref Range Status  04/06/2021 661 232 - 1,245 pg/mL Final         Passed - Valid encounter within last 6 months    Recent Outpatient Visits           2 months ago Diabetes mellitus without complication [facility])   [place], Donald E, MD   4 months ago Acute exacerbation of emphysema [facility])   [place] Mikey Kirschner, [place]  4 months ago Acute exacerbation of emphysema [facility])   [place] Mikey Kirschner, PA-C   6 months ago Diabetes mellitus without complication [facility])   [place] Birdie Sons, MD   10 months ago Diabetes mellitus without complication [facility])   [place] [facility] Birdie Sons, MD

## 2022-06-02 ENCOUNTER — Other Ambulatory Visit: Payer: Self-pay | Admitting: Family Medicine

## 2022-06-02 DIAGNOSIS — I10 Essential (primary) hypertension: Secondary | ICD-10-CM

## 2022-06-02 DIAGNOSIS — E119 Type 2 diabetes mellitus without complications: Secondary | ICD-10-CM

## 2022-06-02 DIAGNOSIS — F410 Panic disorder [episodic paroxysmal anxiety] without agoraphobia: Secondary | ICD-10-CM

## 2022-06-11 ENCOUNTER — Telehealth (HOSPITAL_COMMUNITY): Payer: Self-pay | Admitting: Emergency Medicine

## 2022-06-11 ENCOUNTER — Telehealth (HOSPITAL_COMMUNITY): Payer: Self-pay | Admitting: *Deleted

## 2022-06-11 NOTE — Telephone Encounter (Signed)
Attempted to call patient regarding upcoming cardiac CT appointment. °Left message on voicemail with name and callback number °Catrina Fellenz RN Navigator Cardiac Imaging °[facility] °[phone] Office °[phone] Cell ° °

## 2022-06-11 NOTE — Telephone Encounter (Signed)
Patient returning call about her upcoming cardiac imaging study; pt verbalizes understanding of appt date/time, parking situation and where to check in, pre-test NPO status and medications ordered, and verified current allergies; name and call back number provided for further questions should they arise  Haeven Nickle RN Navigator Cardiac Imaging [facility] [phone] office [phone] cell  Patient to take 100mg metoprolol tartrate and 15mg ivabradine two hours prior to her cardiac CT scan. 

## 2022-06-14 ENCOUNTER — Ambulatory Visit
Admission: RE | Admit: 2022-06-14 | Discharge: 2022-06-14 | Disposition: A | Discharge status: Home / Self Care | Payer: BC Managed Care – PPO | Source: Ambulatory Visit | Attending: Cardiovascular Disease | Admitting: Cardiovascular Disease

## 2022-06-14 DIAGNOSIS — R079 Chest pain, unspecified: Secondary | ICD-10-CM | POA: Diagnosis not present

## 2022-06-14 MED ORDER — NITROGLYCERIN 0.4 MG SL SUBL
SUBLINGUAL_TABLET | SUBLINGUAL | Status: AC
  Filled 2022-06-14: qty 2

## 2022-06-14 MED ORDER — IOHEXOL 350 MG/ML SOLN
100.0000 mL | Freq: Once | INTRAVENOUS | Status: AC | PRN
  Administered 2022-06-14: 100 mL via INTRAVENOUS

## 2022-06-14 MED ORDER — SODIUM CHLORIDE 0.9 % IV BOLUS
250.0000 mL | Freq: Once | INTRAVENOUS | Status: AC
  Administered 2022-06-14: 250 mL via INTRAVENOUS

## 2022-06-14 MED ORDER — NITROGLYCERIN 0.4 MG SL SUBL
0.8000 mg | SUBLINGUAL_TABLET | Freq: Once | SUBLINGUAL | Status: AC
  Administered 2022-06-14: 0.8 mg via SUBLINGUAL

## 2022-06-14 NOTE — Progress Notes (Signed)
Patient tolerated procedure well. Ambulate w/o difficulty. Denies any lightheadedness or being dizzy. Pt denies any pain at this time. Sitting in chair, pt administered 250 ml's of ns via iv, pt tol well. Pt is encouraged to drink additional water throughout the day and reason explained to patient. Patient verbalized understanding and all questions answered. ABC intact. No further needs at this time. Discharge from procedure area w/o issues.

## 2022-07-31 ENCOUNTER — Other Ambulatory Visit: Payer: Self-pay | Admitting: Family Medicine

## 2022-07-31 DIAGNOSIS — J069 Acute upper respiratory infection, unspecified: Secondary | ICD-10-CM

## 2022-08-22 ENCOUNTER — Other Ambulatory Visit: Payer: Self-pay | Admitting: Family Medicine

## 2022-08-23 NOTE — Telephone Encounter (Signed)
Requested medication (s) are due for refill today: yes  Requested medication (s) are on the active medication list: yes  Last refill:  05/25/22  Future visit scheduled: no  Notes to clinic:   Medication not assigned to a protocol, review manually     Requested Prescriptions  Pending Prescriptions Disp Refills   RYBELSUS 7 MG TABS [Pharmacy Med Name: RYBELSUS 7 MG TABLET] 30 tablet 2    Sig: Take 1 tablet (7 mg total) by mouth daily.     Off-Protocol Failed - 08/22/2022 12:38 PM      Failed - Medication not assigned to a protocol, review manually.      Passed - Valid encounter within last 12 months    Recent Outpatient Visits           5 months ago Diabetes mellitus without complication [facility])   [place] [facility] Malva Limes, MD   6 months ago Acute exacerbation of emphysema [facility])   [place] [facility] Alfredia Ferguson, PA-C   7 months ago Acute exacerbation of emphysema [facility])   [place] [facility] Alfredia Ferguson, PA-C   9 months ago Diabetes mellitus without complication [facility])   [place] [facility] Malva Limes, MD   1 year ago Diabetes mellitus without complication [facility])   [place] [facility] Malva Limes, MD

## 2022-11-04 ENCOUNTER — Other Ambulatory Visit: Payer: Self-pay | Admitting: Family Medicine

## 2022-11-04 DIAGNOSIS — K2100 Gastro-esophageal reflux disease with esophagitis, without bleeding: Secondary | ICD-10-CM

## 2022-11-04 DIAGNOSIS — R0600 Dyspnea, unspecified: Secondary | ICD-10-CM

## 2022-11-05 NOTE — Telephone Encounter (Signed)
Requested Prescriptions  Pending Prescriptions Disp Refills   omeprazole (PRILOSEC) 20 MG capsule [Pharmacy Med Name: OMEPRAZOLE DR 20 MG CAPSULE] 90 capsule 5    Sig: TAKE 1 CAPSULE BY MOUTH EVERY DAY     Gastroenterology: Proton Pump Inhibitors Passed - 11/04/2022  8:20 PM      Passed - Valid encounter within last 12 months    Recent Outpatient Visits           7 months ago Diabetes mellitus without complication (HCC)   [place] [facility] Malva Limes, MD   9 months ago Acute exacerbation of emphysema [facility])   [place] [facility] Alfredia Ferguson, PA-C   9 months ago Acute exacerbation of emphysema [facility])   [facility] Alfredia Ferguson, PA-C   11 months ago Diabetes mellitus without complication [facility])   [place] [facility] Malva Limes, MD   1 year ago Diabetes mellitus without complication [facility])   [place] [facility] Sherrie Mustache, Demetrios Isaacs, MD               SPIRIVA RESPIMAT 2.5 MCG/ACT AERS [Pharmacy Med Name: SPIRIVA RESPIMAT 2.5 MCG INH]  3    Sig: INHALE 2 PUFFS BY MOUTH INTO THE LUNGS DAILY     Pulmonology:  Anticholinergic Agents Passed - 11/04/2022  8:20 PM      Passed - Valid encounter within last 12 months    Recent Outpatient Visits           7 months ago Diabetes mellitus without complication (HCC)   [place] [facility] Malva Limes, MD   9 months ago Acute exacerbation of emphysema [facility])   [place] [facility] Alfredia Ferguson, PA-C   9 months ago Acute exacerbation of emphysema [facility])   [place] [facility] Ok Edwards, [place], PA-C   11 months ago Diabetes mellitus without complication [facility])   [place] [facility] Malva Limes, MD   1 year ago Diabetes mellitus without complication [facility])   [place] [facility] Malva Limes, MD               RYBELSUS 7 MG  TABS [Pharmacy Med Name: RYBELSUS 7 MG TABLET] 30 tablet 2    Sig: TAKE 1 TABLET (7 MG TOTAL) BY MOUTH DAILY     Off-Protocol Failed - 11/04/2022  8:20 PM      Failed - Medication not assigned to a protocol, review manually.      Passed - Valid encounter within last 12 months    Recent Outpatient Visits           7 months ago Diabetes mellitus without complication [facility])   [place] [facility] Malva Limes, MD   9 months ago Acute exacerbation of emphysema [facility])   [place] [facility] Alfredia Ferguson, PA-C   9 months ago Acute exacerbation of emphysema [facility])   [place] [facility] Alfredia Ferguson, PA-C   11 months ago Diabetes mellitus without complication [facility])   [place] [facility] Malva Limes, MD   1 year ago Diabetes mellitus without complication [facility])   [place] [facility] Malva Limes, MD

## 2022-11-05 NOTE — Telephone Encounter (Signed)
Requested medications are due for refill today.  yes  Requested medications are on the active medications list.  yes  Last refill. 08/25/2022 #30 2 rf  Future visit scheduled.   no  Notes to clinic.  Medication not assigned to a protocol. Please review for refill.    Requested Prescriptions  Pending Prescriptions Disp Refills   RYBELSUS 7 MG TABS [Pharmacy Med Name: RYBELSUS 7 MG TABLET] 30 tablet 2    Sig: TAKE 1 TABLET (7 MG TOTAL) BY MOUTH DAILY     Off-Protocol Failed - 11/04/2022  8:20 PM      Failed - Medication not assigned to a protocol, review manually.      Passed - Valid encounter within last 12 months    Recent Outpatient Visits           7 months ago Diabetes mellitus without complication [facility])   [facility] Malva Limes, MD   9 months ago Acute exacerbation of emphysema [facility])   [place] [facility] Alfredia Ferguson, PA-C   9 months ago Acute exacerbation of emphysema [facility])   [place] [facility] Alfredia Ferguson, PA-C   11 months ago Diabetes mellitus without complication [facility])   [place] [facility] Malva Limes, MD   1 year ago Diabetes mellitus without complication [facility])   [place] Malva Limes, MD              Signed Prescriptions Disp Refills   omeprazole (PRILOSEC) 20 MG capsule 90 capsule 1    Sig: TAKE 1 CAPSULE BY MOUTH EVERY DAY     Gastroenterology: Proton Pump Inhibitors Passed - 11/04/2022  8:20 PM      Passed - Valid encounter within last 12 months    Recent Outpatient Visits           7 months ago Diabetes mellitus without complication [facility])   [place] [facility] Malva Limes, MD   9 months ago Acute exacerbation of emphysema [facility])   [place] [facility] Alfredia Ferguson, PA-C   9 months ago Acute exacerbation of emphysema [facility])   [place] [facility] Alfredia Ferguson, PA-C   11 months ago Diabetes mellitus without complication [facility])   [place] [facility] Malva Limes, MD   1 year ago Diabetes mellitus without complication [facility])   [place] [facility] Malva Limes, MD               Tiotropium Bromide Monohydrate (SPIRIVA RESPIMAT) 2.5 MCG/ACT AERS 4 g 2    Sig: INHALE 2 PUFFS BY MOUTH INTO THE LUNGS DAILY     Pulmonology:  Anticholinergic Agents Passed - 11/04/2022  8:20 PM      Passed - Valid encounter within last 12 months    Recent Outpatient Visits           7 months ago Diabetes mellitus without complication [facility])   [place] [facility] Mal[facility] Limes, MD   9 months ago Acute exacerbation of emphysema [facility])   [place] [facility] Alfredia Ferguson, PA-C   9 months ago Acute exacerbation of emphysema [facility])   [facility] Ferguson, PA-C   11 months ago Diabetes mellitus without complication [facility])   [place] [facility] Malva Limes, MD   1 year ago Diabetes mellitus without complication (HCC)  [facility] Sherrie Mustache, Demetrios Isaacs, MD

## 2022-11-08 ENCOUNTER — Other Ambulatory Visit (INDEPENDENT_AMBULATORY_CARE_PROVIDER_SITE_OTHER): Payer: Self-pay | Admitting: Nurse Practitioner

## 2022-11-08 DIAGNOSIS — I6523 Occlusion and stenosis of bilateral carotid arteries: Secondary | ICD-10-CM

## 2022-11-09 ENCOUNTER — Ambulatory Visit (INDEPENDENT_AMBULATORY_CARE_PROVIDER_SITE_OTHER): Payer: BC Managed Care – PPO

## 2022-11-09 ENCOUNTER — Encounter (INDEPENDENT_AMBULATORY_CARE_PROVIDER_SITE_OTHER): Payer: Self-pay | Admitting: Nurse Practitioner

## 2022-11-09 ENCOUNTER — Ambulatory Visit (INDEPENDENT_AMBULATORY_CARE_PROVIDER_SITE_OTHER): Payer: BC Managed Care – PPO | Admitting: Nurse Practitioner

## 2022-11-09 VITALS — BP 136/79 | HR 90 | Resp 18 | Ht 60.0 in | Wt 102.4 lb

## 2022-11-09 DIAGNOSIS — I10 Essential (primary) hypertension: Secondary | ICD-10-CM | POA: Diagnosis not present

## 2022-11-09 DIAGNOSIS — I6523 Occlusion and stenosis of bilateral carotid arteries: Secondary | ICD-10-CM

## 2022-11-09 DIAGNOSIS — E785 Hyperlipidemia, unspecified: Secondary | ICD-10-CM | POA: Diagnosis not present

## 2022-11-09 NOTE — Progress Notes (Signed)
Subjective:    Patient ID: Dana Weber, female    DOB: 06/11/1960, 62 y.o.   MRN: [id number] Chief Complaint  Patient presents with   Follow-up    followup in 6 months with carotid     The patient is seen for evaluation of carotid stenosis. The carotid stenosis was identified after duplex ultrasound was obtained for a left carotid bruit.   The patient denies amaurosis fugax. There is no recent history of TIA symptoms or focal motor deficits. There is no prior documented CVA.   There is no history of migraine headaches. There is no history of seizures.   The patient is also followed for PAD.  The patient has had noted pain in her lower extremities.  At her last office visit she had ABIs which were normal.  She has since followed up with neurosurgery at the [facility] where she is receiving further workup and evaluation.     There is a history of hyperlipidemia which is being treated with a statin.    Duplex ultrasound of the carotid arteries obtained today shows some worsening with 60 to 79% of the left ICA with 1 to 39% on the right ICA     Review of Systems  Musculoskeletal:  Positive for arthralgias, back pain and neck pain.  All other systems reviewed and are negative.      Objective:   Physical Exam Vitals reviewed.  HENT:     Head: Normocephalic.  Neck:     Vascular: Carotid bruit present.  Cardiovascular:     Rate and Rhythm: Normal rate and regular rhythm.     Pulses:          Radial pulses are 1+ on the right side and 2+ on the left side.     Heart sounds: Normal heart sounds.  Pulmonary:     Effort: Pulmonary effort is normal.  Musculoskeletal:     Right lower leg: No edema.     Left lower leg: No edema.  Skin:    General: Skin is warm and dry.  Neurological:     Mental Status: She is alert and oriented to person, place, and time.  Psychiatric:        Mood and Affect: Mood normal.        Behavior: Behavior normal.        Thought Content:  Thought content normal.        Judgment: Judgment normal.     BP 136/79 (BP Location: Right Arm)   Pulse 90   Resp 18   Ht 5' (1.524 m)   Wt 102 lb 6.4 oz (46.4 kg)   BMI 20.00 kg/m   Past Medical History:  Diagnosis Date   Carotid artery stenosis    Diabetes mellitus without complication (HCC)    Fatty liver    Headache    sinus   Hyperlipidemia    Hypertension    Multilevel degenerative disc disease    "spurs" on spine also   Neuropathy    hands and feet   Vertigo    mild - daily - positional   Wears dentures    partial lower    Social History   Socioeconomic History   Marital status: Married    Spouse name: Not on file   Number of children: Not on file   Years of education: Not on file   Highest education level: Not on file  Occupational History   Not on file  Tobacco  Use   Smoking status: Some Days    Current packs/day: 0.25    Average packs/day: 0.3 packs/day for 42.0 years (10.5 ttl pk-yrs)    Types: Cigarettes   Smokeless tobacco: Never   Tobacco comments:    has a cigarette every few days  Vaping Use   Vaping status: Former  Substance and Sexual Activity   Alcohol use: Yes    Alcohol/week: 0.0 standard drinks of alcohol    Comment: OCCASIONALLY   Drug use: No   Sexual activity: Yes    Birth control/protection: Surgical    Comment: hysterectomy   Other Topics Concern   Not on file  Social History Narrative   Not on file   Social Determinants of Health   Financial Resource Strain: Not on file  Food Insecurity: Not on file  Transportation Needs: Not on file  Physical Activity: Not on file  Stress: Not on file  Social Connections: Unknown (08/04/2021)   [facility]   Social Network    Social Network: Not on file  Intimate Partner Violence: Unknown (06/25/2021)   [facility] from [facility]   HITS    Physically Hurt: Not on file    Insult or Talk Down To: Not on file    Threaten Physical Harm: Not on file    Scream or  Curse: Not on file    Past Surgical History:  Procedure Laterality Date   ABDOMINAL HYSTERECTOMY     CESAREAN SECTION     COLONOSCOPY WITH PROPOFOL N/A 03/20/2015   Procedure: COLONOSCOPY WITH PROPOFOL;  Surgeon: Midge Minium, MD;  [facility];  Service: Endoscopy;  Laterality: N/A;   COLONOSCOPY WITH PROPOFOL N/A 11/14/2020   Procedure: COLONOSCOPY WITH PROPOFOL;  Surgeon: Midge Minium, MD;  Location: [facility];  Service: Endoscopy;  Laterality: N/A;  Diabetic - oral meds   DILATION AND CURETTAGE OF UTERUS     ESOPHAGOGASTRODUODENOSCOPY (EGD) WITH PROPOFOL N/A 03/20/2015   Procedure: ESOPHAGOGASTRODUODENOSCOPY (EGD) WITH PROPOFOL;  Surgeon: Midge Minium, MD;  Location[facility];  Service: Endoscopy;  Laterality: N/A;  Diabetic - oral meds   HEMORRHOID SURGERY     LAPAROSCOPIC CHOLECYSTECTOMY     LAPAROSCOPIC SUPRACERVICAL HYSTERECTOMY  2008   SALPINGOOPHORECTOMY Left 2008   TONSILLECTOMY AND ADENOIDECTOMY  1960    Family History  Problem Relation Age of Onset   Diabetes Mother    Hypertension Mother    Diabetes Father    Hypertension Father    Hypertension Brother    Hypertension Brother    Diabetes Paternal Grandmother    Emphysema Paternal Grandfather     Allergies  Allergen Reactions   Hydrocodone Swelling   Penicillins Swelling   Amoxicillin Swelling    Facial, and itching   Hydrocodone Bitartrate Er Swelling   Wellbutrin [Bupropion]     Makes her mean       Latest Ref Rng & Units 03/26/2022    4:27 PM 04/06/2021    8:44 AM 08/13/2020    9:37 AM  CBC  WBC 3.4 - 10.8 x10E3/uL 6.9  6.2  8.6   Hemoglobin 11.1 - 15.9 g/dL 14.7  82.9  56.2   Hematocrit 34.0 - 46.6 % 41.0  46.6  44.5   Platelets 150 - 450 x10E3/uL 248  300  294       CMP     Component Value Date/Time   NA 137 06/01/2022 1537   NA 139 03/26/2022 1627   K 3.7 06/01/2022 1537  CL 102 06/01/2022 1537   CO2 24 06/01/2022 1537   GLUCOSE 121 (H) 06/01/2022  1537   BUN 12 06/01/2022 1537   BUN 12 03/26/2022 1627   CREATININE 0.63 06/01/2022 1537   CALCIUM 9.1 06/01/2022 1537   PROT 6.6 03/26/2022 1627   ALBUMIN 4.3 03/26/2022 1627   AST 15 03/26/2022 1627   ALT 22 03/26/2022 1627   ALKPHOS 71 03/26/2022 1627   BILITOT 0.2 03/26/2022 1627   EGFR 104 03/26/2022 1627   GFRNONAA >60 06/01/2022 1537     No results found.     Assessment & Plan:   1. Bilateral carotid artery stenosis Recommend:  Given the patient's asymptomatic subcritical stenosis no further invasive testing or surgery at this time.  Duplex ultrasound shows 1 to 39% stenosis of the right ICA with 60 to 79% stenosis of the left  Continue antiplatelet therapy as prescribed Continue management of CAD, HTN and Hyperlipidemia Healthy heart diet,  encouraged exercise at least 4 times per week Follow up in 6 months with duplex ultrasound and physical exam   2. Essential (primary) hypertension Continue antihypertensive medications as already ordered, these medications have been reviewed and there are no changes at this time.  3. Hyperlipidemia, unspecified hyperlipidemia type  Continue statin as ordered and reviewed, no changes at this time  Current Outpatient Medications on File Prior to Visit  Medication Sig Dispense Refill   albuterol (VENTOLIN HFA) 108 (90 Base) MCG/ACT inhaler INHALE 2 PUFFS BY MOUTH EVERY 6 HOURS AS NEEDED FOR WHEEZING OR SHORTNESS OF BREATH 8.5 g 5   aspirin EC 81 MG tablet Take 81 mg by mouth daily. Swallow whole.     benazepril (LOTENSIN) 5 MG tablet TAKE 1 TABLET (5 MG TOTAL) BY MOUTH DAILY. 90 tablet 3   ezetimibe (ZETIA) 10 MG tablet Take 1 tablet (10 mg total) by mouth daily. 90 tablet 3   FARXIGA 10 MG TABS tablet TAKE 1 TABLET BY MOUTH DAILY BEFORE BREAKFAST 90 tablet 4   fluticasone (FLONASE) 50 MCG/ACT nasal spray SPRAY 2 SPRAYS INTO EACH NOSTRIL EVERY DAY 48 mL 2   glipiZIDE (GLUCOTROL XL) 5 MG 24 hr tablet TAKE 1 TABLET BY MOUTH EVERY  DAY WITH BREAKFAST 90 tablet 2   ivabradine (CORLANOR) 5 MG TABS tablet Take 3 tablets by mouth once 2 hours before CTA. 3 tablet 0   lovastatin (MEVACOR) 20 MG tablet Take 1 tablet (20 mg total) by mouth daily. 90 tablet 4   meloxicam (MOBIC) 7.5 MG tablet Take 1-2 tablets (7.5-15 mg total) by mouth daily as needed for pain. 30 tablet 4   metFORMIN (GLUCOPHAGE-XR) 500 MG 24 hr tablet TAKE 2 TABLETS BY MOUTH TWICE A DAY 360 tablet 4   metoprolol tartrate (LOPRESSOR) 100 MG tablet Take 1 tablet by mouth once 2 hours before CTA. 1 tablet 0   naproxen (NAPROSYN) 500 MG tablet Take 1 tablet (500 mg total) by mouth 2 (two) times daily with a meal. 30 tablet 2   omeprazole (PRILOSEC) 20 MG capsule TAKE 1 CAPSULE BY MOUTH EVERY DAY 90 capsule 1   PARoxetine (PAXIL) 40 MG tablet TAKE 1 TABLET BY MOUTH EVERY DAY 90 tablet 4   Semaglutide (RYBELSUS) 3 MG TABS Take 1 tablet (3 mg total) by mouth daily. 30 tablet 0   Semaglutide (RYBELSUS) 7 MG TABS TAKE 1 TABLET (7 MG TOTAL) BY MOUTH DAILY 30 tablet 0   Tiotropium Bromide Monohydrate (SPIRIVA RESPIMAT) 2.5 MCG/ACT AERS INHALE 2 PUFFS BY  MOUTH INTO THE LUNGS DAILY 4 g 2   traZODone (DESYREL) 100 MG tablet TAKE 1/2 TO 1 TABLET BY MOUTH AT BEDTIME 90 tablet 0   No current facility-administered medications on file prior to visit.    There are no Patient Instructions on file for this visit. No follow-ups on file.   Georgiana Spinner, NP

## 2022-11-18 ENCOUNTER — Telehealth: Payer: Self-pay | Admitting: Family Medicine

## 2022-11-18 NOTE — Telephone Encounter (Signed)
[facility] is requesting prior authorization Key: [id number] Name: [employer] Rybelsus 7MG  tablets

## 2022-11-22 ENCOUNTER — Other Ambulatory Visit: Payer: Self-pay | Admitting: Family Medicine

## 2022-11-22 DIAGNOSIS — E785 Hyperlipidemia, unspecified: Secondary | ICD-10-CM

## 2022-11-23 ENCOUNTER — Ambulatory Visit: Payer: BC Managed Care – PPO | Admitting: Family Medicine

## 2022-11-24 NOTE — Telephone Encounter (Signed)
Apologies, but our team doesn't currently handle PA's for this office. Rerouting back to pool, thank you.

## 2022-11-24 NOTE — Telephone Encounter (Signed)
Requested Prescriptions  Pending Prescriptions Disp Refills   lovastatin (MEVACOR) 20 MG tablet [Pharmacy Med Name: LOVASTATIN 20 MG TABLET] 90 tablet 0    Sig: TAKE 1 TABLET BY MOUTH EVERY DAY     Cardiovascular:  Antilipid - Statins 2 Failed - 11/22/2022  1:08 PM      Failed - Lipid Panel in normal range within the last 12 months    Cholesterol, Total  Date Value Ref Range Status  03/26/2022 163 100 - 199 mg/dL Final   LDL Chol Calc (NIH)  Date Value Ref Range Status  03/26/2022 95 0 - 99 mg/dL Final   HDL  Date Value Ref Range Status  03/26/2022 53 >39 mg/dL Final   Triglycerides  Date Value Ref Range Status  03/26/2022 80 0 - 149 mg/dL Final         Passed - Cr in normal range and within 360 days    Creatinine, Ser  Date Value Ref Range Status  06/01/2022 0.63 0.44 - 1.00 mg/dL Final   Creatinine, POC  Date Value Ref Range Status  03/13/2018 n/a mg/dL Final         Passed - Patient is not pregnant      Passed - Valid encounter within last 12 months    Recent Outpatient Visits           8 months ago Diabetes mellitus without complication [facility])   [place] [facility] Malva Limes, MD   9 months ago Acute exacerbation of emphysema [facility])   [place] [facility] Alfredia Ferguson, PA-C   10 months ago Acute exacerbation of emphysema [facility])   [place] [facility] Alfredia Ferguson, PA-C   1 year ago Diabetes mellitus without complication [facility])   [place] [facility] Malva Limes, MD   1 year ago Diabetes mellitus without complication [facility])   [place] [facility] Malva Limes, MD       Future Appointments             In 1 week Fisher, Demetrios Isaacs, MD [facility]

## 2022-11-26 NOTE — Telephone Encounter (Signed)
PA initiated

## 2022-11-27 NOTE — Addendum Note (Signed)
Addended by: Malva Limes on: 11/27/2022 08:39 AM   Modules accepted: Orders

## 2022-11-29 NOTE — Telephone Encounter (Signed)
Message from Plan Your PA request has been approved. Additional information will be provided in the approval communication. (Message [id number]). Authorization Expiration Date: November 26, 2025.

## 2022-12-03 ENCOUNTER — Ambulatory Visit: Payer: BC Managed Care – PPO | Admitting: Family Medicine

## 2022-12-17 ENCOUNTER — Other Ambulatory Visit: Payer: Self-pay | Admitting: Family Medicine

## 2022-12-17 ENCOUNTER — Ambulatory Visit: Payer: BC Managed Care – PPO | Admitting: Family Medicine

## 2022-12-17 VITALS — BP 176/88 | HR 85 | Ht 60.0 in | Wt 103.4 lb

## 2022-12-17 DIAGNOSIS — R110 Nausea: Secondary | ICD-10-CM

## 2022-12-17 DIAGNOSIS — R140 Abdominal distension (gaseous): Secondary | ICD-10-CM | POA: Diagnosis not present

## 2022-12-17 DIAGNOSIS — I10 Essential (primary) hypertension: Secondary | ICD-10-CM | POA: Diagnosis not present

## 2022-12-17 DIAGNOSIS — E119 Type 2 diabetes mellitus without complications: Secondary | ICD-10-CM | POA: Diagnosis not present

## 2022-12-17 DIAGNOSIS — E785 Hyperlipidemia, unspecified: Secondary | ICD-10-CM

## 2022-12-17 NOTE — Progress Notes (Unsigned)
Established patient visit   Patient: Dana Weber   DOB: Dec 06, 1960   62 y.o. Female  MRN: [id number] Visit Date: 12/17/2022  Today's healthcare provider: Mila Merry, MD   Chief Complaint  Patient presents with   Medical Management of Chronic Issues    9 month follow up, check A1C    Subjective    HPI HPI     Medical Management of Chronic Issues    Additional comments: 9 month follow up, check A1C       Last edited by Rolly Salter, CMA on 12/17/2022  3:54 PM.      Here to follow up diabetes, hyperlipidemia and hypertension on Rybelsus since February. C/o persistent nausea, bloating, and poor appetite. No other new problems. Sugars have been much better since starting Rybelsus Lab Results  Component Value Date   HGBA1C 8.8 (H) 03/26/2022   HGBA1C 7.6 (A) 11/24/2021   HGBA1C 8.0 (A) 07/27/2021   Lab Results  Component Value Date   NA 137 06/01/2022   CL 102 06/01/2022   K 3.7 06/01/2022   CO2 24 06/01/2022   BUN 12 06/01/2022   CREATININE 0.63 06/01/2022   GFRNONAA >60 06/01/2022   CALCIUM 9.1 06/01/2022   ALBUMIN 4.3 03/26/2022   GLUCOSE 121 (H) 06/01/2022     Medications: Outpatient Medications Prior to Visit  Medication Sig   albuterol (VENTOLIN HFA) 108 (90 Base) MCG/ACT inhaler INHALE 2 PUFFS BY MOUTH EVERY 6 HOURS AS NEEDED FOR WHEEZING OR SHORTNESS OF BREATH   aspirin EC 81 MG tablet Take 81 mg by mouth daily. Swallow whole.   benazepril (LOTENSIN) 5 MG tablet TAKE 1 TABLET (5 MG TOTAL) BY MOUTH DAILY.   ezetimibe (ZETIA) 10 MG tablet Take 1 tablet (10 mg total) by mouth daily.   FARXIGA 10 MG TABS tablet TAKE 1 TABLET BY MOUTH DAILY BEFORE BREAKFAST   fluticasone (FLONASE) 50 MCG/ACT nasal spray SPRAY 2 SPRAYS INTO EACH NOSTRIL EVERY DAY   glipiZIDE (GLUCOTROL XL) 5 MG 24 hr tablet TAKE 1 TABLET BY MOUTH EVERY DAY WITH BREAKFAST   ivabradine (CORLANOR) 5 MG TABS tablet Take 3 tablets by mouth once 2 hours before CTA.   lovastatin  (MEVACOR) 20 MG tablet TAKE 1 TABLET BY MOUTH EVERY DAY   meloxicam (MOBIC) 7.5 MG tablet Take 1-2 tablets (7.5-15 mg total) by mouth daily as needed for pain.   metFORMIN (GLUCOPHAGE-XR) 500 MG 24 hr tablet TAKE 2 TABLETS BY MOUTH TWICE A DAY   metoprolol tartrate (LOPRESSOR) 100 MG tablet Take 1 tablet by mouth once 2 hours before CTA.   naproxen (NAPROSYN) 500 MG tablet Take 1 tablet (500 mg total) by mouth 2 (two) times daily with a meal.   omeprazole (PRILOSEC) 20 MG capsule TAKE 1 CAPSULE BY MOUTH EVERY DAY   PARoxetine (PAXIL) 40 MG tablet TAKE 1 TABLET BY MOUTH EVERY DAY   Semaglutide (RYBELSUS) 7 MG TABS TAKE 1 TABLET (7 MG TOTAL) BY MOUTH DAILY   Tiotropium Bromide Monohydrate (SPIRIVA RESPIMAT) 2.5 MCG/ACT AERS INHALE 2 PUFFS BY MOUTH INTO THE LUNGS DAILY   traZODone (DESYREL) 100 MG tablet TAKE 1/2 TO 1 TABLET BY MOUTH AT BEDTIME   No facility-administered medications prior to visit.       Objective    BP (!) 176/88 (BP Location: Left Arm, Patient Position: Sitting, Cuff Size: Normal)   Pulse 85   Ht 5' (1.524 m)   Wt 103 lb 6.4 oz (46.9 kg)  BMI 20.19 kg/m  {Insert last BP/Wt (optional):23777}{See vitals history (optional):1}  Physical Exam   General: Appearance:    Well developed, well nourished female in no acute distress  Eyes:    PERRL, conjunctiva/corneas clear, EOM's intact       Lungs:     Clear to auscultation bilaterally, respirations unlabored  Heart:    Normal heart rate. Normal rhythm. No murmurs, rubs, or gallops.    MS:   All extremities are intact.    Neurologic:   Awake, alert, oriented x 3. No apparent focal neurological defect.         Assessment & Plan     1. Diabetes mellitus without complication (HCC)  - Hemoglobin A1c  2. Nausea   3. Abdominal bloating Likely secondary to current dose of Rybelsus.  She is to stop Rybelus for now. If sx resolve will start back on lower dose of 3mt  4. Essential (primary) hypertension Doing well  current medications, but BP elevated in office today.   5. Hyperlipidemia, unspecified hyperlipidemia type She is tolerating lovastatin well with no adverse effects.   - CBC - Comprehensive metabolic panel - Lipid panel         Mila Merry, MD  [facility] [phone] (phone) [phone] (fax)  [facility]

## 2022-12-17 NOTE — Telephone Encounter (Signed)
Requested medications are due for refill today.  yes  Requested medications are on the active medications list.  yes  Last refill. 11/05/2022 #30 0 rf  Future visit scheduled.   no  Notes to clinic.  Medication not assigned to a protocol. Please review for refill.    Requested Prescriptions  Pending Prescriptions Disp Refills   RYBELSUS 7 MG TABS [Pharmacy Med Name: RYBELSUS 7 MG TABLET] 30 tablet 0    Sig: TAKE 1 TABLET (7 MG TOTAL) BY MOUTH DAILY     Off-Protocol Failed - 12/17/2022  5:23 PM      Failed - Medication not assigned to a protocol, review manually.      Passed - Valid encounter within last 12 months    Recent Outpatient Visits           Today Diabetes mellitus without complication (HCC)   [place] [facility] Malva Limes, MD   8 months ago Diabetes mellitus without complication [facility])   [place] [facility] Malva Limes, MD   10 months ago Acute exacerbation of emphysema [facility])   [place] [facility] Alfredia Ferguson, PA-C   11 months ago Acute exacerbation of emphysema (HCC)   [place] [facility] Alfredia Ferguson, PA-C   1 year ago Diabetes mellitus without complication (HCC)   [place] [facility] Malva Limes, MD              Refused Prescriptions Disp Refills   glipiZIDE (GLUCOTROL XL) 5 MG 24 hr tablet [Pharmacy Med Name: GLIPIZIDE ER 5 MG TABLET] 90 tablet 2    Sig: TAKE 1 TABLET BY MOUTH EVERY DAY WITH BREAKFAST     Endocrinology:  Diabetes - Sulfonylureas Failed - 12/17/2022  5:23 PM      Failed - HBA1C is between 0 and 7.9 and within 180 days    Hemoglobin A1C  Date Value Ref Range Status  04/30/2016 7.5  Final   Hgb A1c MFr Bld  Date Value Ref Range Status  03/26/2022 8.8 (H) 4.8 - 5.6 % Final    Comment:             Prediabetes: 5.7 - 6.4          Diabetes: >6.4          Glycemic control for adults with diabetes: <7.0          Passed  - Cr in normal range and within 360 days    Creatinine, Ser  Date Value Ref Range Status  06/01/2022 0.63 0.44 - 1.00 mg/dL Final   Creatinine, POC  Date Value Ref Range Status  03/13/2018 n/a mg/dL Final         Passed - Valid encounter within last 6 months    Recent Outpatient Visits           Today Diabetes mellitus without complication [facility])   [place] [facility] Malva Limes, MD   8 months ago Diabetes mellitus without complication [facility])   [place] [facility] Malva Limes, MD   10 months ago Acute exacerbation of emphysema [facility])   [facility] Ferguson, PA-C   11 months ago Acute exacerbation of emphysema [facility])   [place] [facility] Alfredia Ferguson, PA-C   1 year ago Diabetes mellitus without complication [facility])    [facility] Sherrie Mustache, Demetrios Isaacs, MD

## 2022-12-17 NOTE — Telephone Encounter (Signed)
Requested Prescriptions  Pending Prescriptions Disp Refills   RYBELSUS 7 MG TABS [Pharmacy Med Name: RYBELSUS 7 MG TABLET] 30 tablet 0    Sig: TAKE 1 TABLET (7 MG TOTAL) BY MOUTH DAILY     Off-Protocol Failed - 12/17/2022  5:23 PM      Failed - Medication not assigned to a protocol, review manually.      Passed - Valid encounter within last 12 months    Recent Outpatient Visits           Today Diabetes mellitus without complication (HCC)   [place] [facility] Malva Limes, MD   8 months ago Diabetes mellitus without complication [facility])   [facility] Malva Limes, MD   10 months ago Acute exacerbation of emphysema [facility])   [place] [facility] Alfredia Ferguson, PA-C   11 months ago Acute exacerbation of emphysema (HCC)   [place] [facility] Alfredia Ferguson, PA-C   1 year ago Diabetes mellitus without complication (HCC)   [place] [facility] Malva Limes, MD               glipiZIDE (GLUCOTROL XL) 5 MG 24 hr tablet [Pharmacy Med Name: GLIPIZIDE ER 5 MG TABLET] 90 tablet 2    Sig: TAKE 1 TABLET BY MOUTH EVERY DAY WITH BREAKFAST     Endocrinology:  Diabetes - Sulfonylureas Failed - 12/17/2022  5:23 PM      Failed - HBA1C is between 0 and 7.9 and within 180 days    Hemoglobin A1C  Date Value Ref Range Status  04/30/2016 7.5  Final   Hgb A1c MFr Bld  Date Value Ref Range Status  03/26/2022 8.8 (H) 4.8 - 5.6 % Final    Comment:             Prediabetes: 5.7 - 6.4          Diabetes: >6.4          Glycemic control for adults with diabetes: <7.0          Passed - Cr in normal range and within 360 days    Creatinine, Ser  Date Value Ref Range Status  06/01/2022 0.63 0.44 - 1.00 mg/dL Final   Creatinine, POC  Date Value Ref Range Status  03/13/2018 n/a mg/dL Final         Passed - Valid encounter within last 6 months    Recent Outpatient Visits           Today  Diabetes mellitus without complication [facility])   [place] [facility] Malva Limes, MD   8 months ago Diabetes mellitus without complication [facility])   [place] [facility] Malva Limes, MD   10 months ago Acute exacerbation of emphysema [facility])   [place] [facility] Alfredia Ferguson, PA-C   11 months ago Acute exacerbation of emphysema [facility])   [place] [facility] Alfredia Ferguson, PA-C   1 year ago Diabetes mellitus without complication [facility])    [facility] Sherrie Mustache, Demetrios Isaacs, MD

## 2022-12-18 LAB — COMPREHENSIVE METABOLIC PANEL
ALT: 18 [IU]/L (ref 0–32)
AST: 13 [IU]/L (ref 0–40)
Albumin: 4.1 g/dL (ref 3.9–4.9)
Alkaline Phosphatase: 62 [IU]/L (ref 44–121)
BUN/Creatinine Ratio: 16 (ref 12–28)
BUN: 8 mg/dL (ref 8–27)
Bilirubin Total: <0.2 mg/dL (ref 0.0–1.2)
CO2: 24 mmol/L (ref 20–29)
Calcium: 9.6 mg/dL (ref 8.7–10.3)
Chloride: 99 mmol/L (ref 96–106)
Creatinine, Ser: 0.51 mg/dL — ABNORMAL LOW (ref 0.57–1.00)
Globulin, Total: 1.9 g/dL (ref 1.5–4.5)
Glucose: 115 mg/dL — ABNORMAL HIGH (ref 70–99)
Potassium: 4.1 mmol/L (ref 3.5–5.2)
Sodium: 140 mmol/L (ref 134–144)
Total Protein: 6 g/dL (ref 6.0–8.5)
eGFR: 106 mL/min/{1.73_m2} (ref >59)

## 2022-12-18 LAB — LIPID PANEL
Chol/HDL Ratio: 2.5 {ratio} (ref 0.0–4.4)
Cholesterol, Total: 116 mg/dL (ref 100–199)
HDL: 46 mg/dL (ref >39)
LDL Chol Calc (NIH): 53 mg/dL (ref 0–99)
Triglycerides: 88 mg/dL (ref 0–149)
VLDL Cholesterol Cal: 17 mg/dL (ref 5–40)

## 2022-12-18 LAB — CBC
Hematocrit: 42.1 % (ref 34.0–46.6)
Hemoglobin: 13.9 g/dL (ref 11.1–15.9)
MCH: 30.7 pg (ref 26.6–33.0)
MCHC: 33 g/dL (ref 31.5–35.7)
MCV: 93 fL (ref 79–97)
Platelets: 329 10*3/uL (ref 150–450)
RBC: 4.53 x10E6/uL (ref 3.77–5.28)
RDW: 12.4 % (ref 11.7–15.4)
WBC: 7.4 10*3/uL (ref 3.4–10.8)

## 2022-12-18 LAB — HEMOGLOBIN A1C
Est. average glucose Bld gHb Est-mCnc: 166 mg/dL
Hgb A1c MFr Bld: 7.4 % — ABNORMAL HIGH (ref 4.8–5.6)

## 2022-12-27 ENCOUNTER — Telehealth: Payer: Self-pay | Admitting: Family Medicine

## 2022-12-27 DIAGNOSIS — R110 Nausea: Secondary | ICD-10-CM

## 2022-12-27 DIAGNOSIS — R140 Abdominal distension (gaseous): Secondary | ICD-10-CM

## 2022-12-27 NOTE — Telephone Encounter (Signed)
Please check with patient to see if her stomach is doing better since stopping Rybelsus after last office visit. If so we can restart it at just 1/2 tablet daily.

## 2022-12-28 NOTE — Telephone Encounter (Signed)
Patient reports that she is not sure if the Rybelsus is the one causing the stomach pain. Reports is not any different.She stopped the Rybelsus for 4 days after seeing the provider and stop it for 2 days this past weekend.

## 2022-12-30 NOTE — Telephone Encounter (Signed)
Patient aware.

## 2022-12-30 NOTE — Telephone Encounter (Signed)
Need ultrasound for evaluation of stomach pains. Recommend restart Rybelsus at lower dose. Have placed order for ultrasound and rybelsus.

## 2023-01-09 ENCOUNTER — Other Ambulatory Visit: Payer: Self-pay | Admitting: Family Medicine

## 2023-01-09 DIAGNOSIS — J069 Acute upper respiratory infection, unspecified: Secondary | ICD-10-CM

## 2023-01-12 ENCOUNTER — Ambulatory Visit
Admission: RE | Admit: 2023-01-12 | Discharge: 2023-01-12 | Disposition: A | Discharge status: Home / Self Care | Payer: BC Managed Care – PPO | Source: Ambulatory Visit | Attending: Family Medicine | Admitting: Family Medicine

## 2023-01-12 DIAGNOSIS — R110 Nausea: Secondary | ICD-10-CM | POA: Diagnosis present

## 2023-01-12 DIAGNOSIS — R140 Abdominal distension (gaseous): Secondary | ICD-10-CM | POA: Insufficient documentation

## 2023-01-19 ENCOUNTER — Encounter: Payer: Self-pay | Admitting: Family Medicine

## 2023-01-20 LAB — HM MAMMOGRAPHY

## 2023-01-23 ENCOUNTER — Other Ambulatory Visit: Payer: Self-pay | Admitting: Family Medicine

## 2023-01-23 DIAGNOSIS — E119 Type 2 diabetes mellitus without complications: Secondary | ICD-10-CM

## 2023-01-28 ENCOUNTER — Other Ambulatory Visit: Payer: Self-pay | Admitting: Family Medicine

## 2023-01-28 ENCOUNTER — Encounter: Payer: Self-pay | Admitting: Family Medicine

## 2023-01-28 DIAGNOSIS — R0600 Dyspnea, unspecified: Secondary | ICD-10-CM

## 2023-01-28 DIAGNOSIS — E119 Type 2 diabetes mellitus without complications: Secondary | ICD-10-CM

## 2023-01-28 NOTE — Telephone Encounter (Signed)
Medication Refill -  Most Recent Primary Care Visit:  Provider: Malva Limes  Department: [facility]  Visit Type: OFFICE VISIT  Date: 12/17/2022  Medication: FARXIGA 10 MG TABS tablet [[id number]]   Has the patient contacted their pharmacy? Yes (Agent: If no, request that the patient contact the pharmacy for the refill. If patient does not wish to contact the pharmacy document the reason why and proceed with request.) (Agent: If yes, when and what did the pharmacy advise?)  Is this the correct pharmacy for this prescription? Yes If no, delete pharmacy and type the correct one.  This is the patient's preferred pharmacy:   Has the prescription been filled recently? No  Is the patient out of the medication? Yes Pt took the last pill this morning  Has the patient been seen for an appointment in the last year OR does the patient have an upcoming appointment? Yes  Can we respond through MyChart? No  Agent: Please be advised that Rx refills may take up to 3 business days. We ask that you follow-up with your pharmacy.

## 2023-01-31 MED ORDER — DAPAGLIFLOZIN PROPANEDIOL 10 MG PO TABS: 10.0000 mg | ORAL_TABLET | Freq: Every day | ORAL | 0 refills | Status: DC

## 2023-01-31 NOTE — Telephone Encounter (Signed)
Patient requesting refills. Last refill prior to appt.  Requested Prescriptions  Pending Prescriptions Disp Refills   dapagliflozin propanediol (FARXIGA) 10 MG TABS tablet 90 tablet 0    Sig: Take 1 tablet (10 mg total) by mouth daily before breakfast.     Endocrinology:  Diabetes - SGLT2 Inhibitors Failed - 01/31/2023  8:02 AM      Failed - Cr in normal range and within 360 days    Creatinine, Ser  Date Value Ref Range Status  12/17/2022 0.51 (L) 0.57 - 1.00 mg/dL Final   Creatinine, POC  Date Value Ref Range Status  03/13/2018 n/a mg/dL Final         Passed - HBA1C is between 0 and 7.9 and within 180 days    Hemoglobin A1C  Date Value Ref Range Status  04/30/2016 7.5  Final   Hgb A1c MFr Bld  Date Value Ref Range Status  12/17/2022 7.4 (H) 4.8 - 5.6 % Final    Comment:             Prediabetes: 5.7 - 6.4          Diabetes: >6.4          Glycemic control for adults with diabetes: <7.0          Passed - eGFR in normal range and within 360 days    GFR calc Af Amer  Date Value Ref Range Status  05/18/2019 106 >59 mL/min/1.73 Final   GFR, Estimated  Date Value Ref Range Status  06/01/2022 >60 >60 mL/min Final    Comment:    (NOTE) Calculated using the CKD-EPI Creatinine Equation (2021)    eGFR  Date Value Ref Range Status  12/17/2022 106 >59 mL/min/1.73 Final         Passed - Valid encounter within last 6 months    Recent Outpatient Visits           1 month ago Diabetes mellitus without complication (HCC)   [place] [facility] Malva Limes, MD   10 months ago Diabetes mellitus without complication [facility])   [place] [facility] Malva Limes, MD   1 year ago Acute exacerbation of emphysema [facility])   [place] [facility] Alfredia Ferguson, PA-C   1 year ago Acute exacerbation of emphysema [facility])   [place] [facility] Alfredia Ferguson, PA-C   1 year ago Diabetes mellitus without  complication [facility])   [place] [facility] Malva Limes, MD

## 2023-03-14 ENCOUNTER — Encounter: Payer: Self-pay | Admitting: Family Medicine

## 2023-03-14 NOTE — Telephone Encounter (Signed)
 Care team updated and letter sent for eye exam notes.

## 2023-03-24 ENCOUNTER — Telehealth: Payer: Self-pay | Admitting: Family Medicine

## 2023-03-24 DIAGNOSIS — E119 Type 2 diabetes mellitus without complications: Secondary | ICD-10-CM

## 2023-03-24 NOTE — Telephone Encounter (Signed)
 [facility] faxed refill request for the following medications:  Rybelsus  7 mg tablet  Pharmacy comments:  Pt requests new rx:  pt has finished taking all doses of Rybelsus  3mg  and is ready for dose titration.  Please approve prescription for Rybelsus  7 mg if appropriate.  Thank you  Please advise.

## 2023-03-24 NOTE — Telephone Encounter (Signed)
 Patient had some GI sx when she taking the 7mg  tablets. Please check and see if she is OK going back up to 7mg .

## 2023-03-25 MED ORDER — SEMAGLUTIDE 7 MG PO TABS: 7.0000 mg | ORAL_TABLET | Freq: Every day | ORAL | 2 refills | Status: DC

## 2023-03-25 NOTE — Telephone Encounter (Signed)
Attempted to call patient. Left VM for her to return call to office.

## 2023-03-25 NOTE — Telephone Encounter (Signed)
 Patient reports she did request to increase. She says her blood sugars are running high again while she is on the Rybelsus 3 mg and she wants better control.

## 2023-04-25 ENCOUNTER — Ambulatory Visit
Admission: RE | Admit: 2023-04-25 | Discharge: 2023-04-25 | Disposition: A | Discharge status: Home / Self Care | Payer: 59 | Source: Ambulatory Visit | Attending: Acute Care | Admitting: Acute Care

## 2023-04-25 DIAGNOSIS — Z87891 Personal history of nicotine dependence: Secondary | ICD-10-CM | POA: Insufficient documentation

## 2023-04-25 DIAGNOSIS — F17210 Nicotine dependence, cigarettes, uncomplicated: Secondary | ICD-10-CM | POA: Insufficient documentation

## 2023-04-28 ENCOUNTER — Other Ambulatory Visit: Payer: Self-pay | Admitting: Family Medicine

## 2023-04-28 DIAGNOSIS — E119 Type 2 diabetes mellitus without complications: Secondary | ICD-10-CM

## 2023-05-03 ENCOUNTER — Other Ambulatory Visit: Payer: Self-pay | Admitting: Acute Care

## 2023-05-03 DIAGNOSIS — F17210 Nicotine dependence, cigarettes, uncomplicated: Secondary | ICD-10-CM

## 2023-05-03 DIAGNOSIS — Z87891 Personal history of nicotine dependence: Secondary | ICD-10-CM

## 2023-05-03 DIAGNOSIS — Z122 Encounter for screening for malignant neoplasm of respiratory organs: Secondary | ICD-10-CM

## 2023-05-06 ENCOUNTER — Other Ambulatory Visit: Payer: Self-pay | Admitting: Family Medicine

## 2023-05-06 DIAGNOSIS — E785 Hyperlipidemia, unspecified: Secondary | ICD-10-CM

## 2023-05-06 DIAGNOSIS — I10 Essential (primary) hypertension: Secondary | ICD-10-CM

## 2023-05-06 DIAGNOSIS — E119 Type 2 diabetes mellitus without complications: Secondary | ICD-10-CM

## 2023-05-12 ENCOUNTER — Other Ambulatory Visit (INDEPENDENT_AMBULATORY_CARE_PROVIDER_SITE_OTHER): Payer: Self-pay | Admitting: Nurse Practitioner

## 2023-05-12 DIAGNOSIS — I6523 Occlusion and stenosis of bilateral carotid arteries: Secondary | ICD-10-CM

## 2023-05-14 NOTE — Progress Notes (Unsigned)
 MRN : [id number]  Dana Weber is a 63 y.o. (Jul 24, 1960) female who presents with chief complaint of check carotid arteries.  History of Present Illness:    The patient is seen for evaluation of carotid stenosis. The carotid stenosis was identified after duplex ultrasound was obtained for a left carotid bruit.   The patient denies amaurosis fugax. There is no recent history of TIA symptoms or focal motor deficits. There is no prior documented CVA.   There is no history of migraine headaches. There is no history of seizures.   The patient is also followed for PAD.  The patient has had noted pain in her lower extremities.  At her last office visit she had ABIs which were normal.  She has since followed up with neurosurgery at the [facility] where she is receiving further workup and evaluation.     There is a history of hyperlipidemia which is being treated with a statin.    Duplex ultrasound of the carotid arteries obtained today shows some worsening with 60 to 79% of the left ICA with 1 to 39% on the right ICA  No outpatient medications have been marked as taking for the 05/16/23 encounter (Appointment) with Gilda Crease, Latina Craver, MD.    Past Medical History:  Diagnosis Date   Carotid artery stenosis    Diabetes mellitus without complication (HCC)    Fatty liver    Headache    sinus   Hyperlipidemia    Hypertension    Multilevel degenerative disc disease    "spurs" on spine also   Neuropathy    hands and feet   Vertigo    mild - daily - positional   Wears dentures    partial lower    Past Surgical History:  Procedure Laterality Date   ABDOMINAL HYSTERECTOMY     CESAREAN SECTION     COLONOSCOPY WITH PROPOFOL N/A 03/20/2015   Procedure: COLONOSCOPY WITH PROPOFOL;  Surgeon: Midge Minium, MD;  Location: [facility];  Service: Endoscopy;  Laterality: N/A;   COLONOSCOPY WITH PROPOFOL N/A 11/14/2020   Procedure: COLONOSCOPY WITH  PROPOFOL;  Surgeon: Midge Minium, MD;  Location: [facility];  Service: Endoscopy;  Laterality: N/A;  Diabetic - oral meds   DILATION AND CURETTAGE OF UTERUS     ESOPHAGOGASTRODUODENOSCOPY (EGD) WITH PROPOFOL N/A 03/20/2015   Procedure: ESOPHAGOGASTRODUODENOSCOPY (EGD) WITH PROPOFOL;  Surgeon: Midge Minium, MD;  Location: [facility];  Service: Endoscopy;  Laterality: N/A;  Diabetic - oral meds   HEMORRHOID SURGERY     LAPAROSCOPIC CHOLECYSTECTOMY     LAPAROSCOPIC SUPRACERVICAL HYSTERECTOMY  2008   SALPINGOOPHORECTOMY Left 2008   TONSILLECTOMY AND ADENOIDECTOMY  1960    Social History Social History   Tobacco Use   Smoking status: Some Days    Current packs/day: 0.25    Average packs/day: 0.3 packs/day for 42.0 years (10.5 ttl pk-yrs)    Types: Cigarettes   Smokeless tobacco: Never   Tobacco comments:    has a cigarette every few days  Vaping Use   Vaping status: Former  Substance Use Topics   Alcohol use: Yes    Alcohol/week: 0.0 standard drinks of alcohol    Comment: OCCASIONALLY   Drug use: No    Family History Family History  Problem Relation Age of Onset   Diabetes Mother    Hypertension  Mother    Diabetes Father    Hypertension Father    Hypertension Brother    Hypertension Brother    Diabetes Paternal Grandmother    Emphysema Paternal Grandfather     Allergies  Allergen Reactions   Hydrocodone Swelling   Penicillins Swelling   Amoxicillin Swelling    Facial, and itching   Hydrocodone Bitartrate Er Swelling   Wellbutrin [Bupropion]     Makes her mean     REVIEW OF SYSTEMS (Negative unless checked)  Constitutional: [] Weight loss  [] Fever  [] Chills Cardiac: [] Chest pain   [] Chest pressure   [] Palpitations   [] Shortness of breath when laying flat   [] Shortness of breath with exertion. Vascular:  [x] Pain in legs with walking   [] Pain in legs at rest  [] History of DVT   [] Phlebitis   [] Swelling in legs   [] Varicose veins   [] Non-healing  ulcers Pulmonary:   [] Uses home oxygen   [] Productive cough   [] Hemoptysis   [] Wheeze  [] COPD   [] Asthma Neurologic:  [] Dizziness   [] Seizures   [] History of stroke   [] History of TIA  [] Aphasia   [] Vissual changes   [] Weakness or numbness in arm   [] Weakness or numbness in leg Musculoskeletal:   [] Joint swelling   [] Joint pain   [] Low back pain Hematologic:  [] Easy bruising  [] Easy bleeding   [] Hypercoagulable state   [] Anemic Gastrointestinal:  [] Diarrhea   [] Vomiting  [] Gastroesophageal reflux/heartburn   [] Difficulty swallowing. Genitourinary:  [] Chronic kidney disease   [] Difficult urination  [] Frequent urination   [] Blood in urine Skin:  [] Rashes   [] Ulcers  Psychological:  [] History of anxiety   []  History of major depression.  Physical Examination  There were no vitals filed for this visit. There is no height or weight on file to calculate BMI. Gen: WD/WN, NAD Head: [place]/AT, No temporalis wasting.  Ear/Nose/Throat: Hearing grossly intact, nares w/o erythema or drainage Eyes: PER, EOMI, sclera nonicteric.  Neck: Supple, no masses.  No bruit or JVD.  Pulmonary:  Good air movement, no audible wheezing, no use of accessory muscles.  Cardiac: RRR, normal S1, S2, no Murmurs. Vascular:  carotid bruit noted Vessel Right Left  Radial Palpable Palpable  Carotid  Palpable  Palpable  Subclav  Palpable Palpable  Gastrointestinal: soft, non-distended. No guarding/no peritoneal signs.  Musculoskeletal: M/S 5/5 throughout.  No visible deformity.  Neurologic: CN 2-12 intact. Pain and light touch intact in extremities.  Symmetrical.  Speech is fluent. Motor exam as listed above. Psychiatric: Judgment intact, Mood & affect appropriate for pt's clinical situation. Dermatologic: No rashes or ulcers noted.  No changes consistent with cellulitis.   CBC Lab Results  Component Value Date   WBC 7.4 12/17/2022   HGB 13.9 12/17/2022   HCT 42.1 12/17/2022   MCV 93 12/17/2022   PLT 329 12/17/2022     BMET    Component Value Date/Time   NA 140 12/17/2022 1625   K 4.1 12/17/2022 1625   CL 99 12/17/2022 1625   CO2 24 12/17/2022 1625   GLUCOSE 115 (H) 12/17/2022 1625   GLUCOSE 121 (H) 06/01/2022 1537   BUN 8 12/17/2022 1625   CREATININE 0.51 (L) 12/17/2022 1625   CALCIUM 9.6 12/17/2022 1625   GFRNONAA >60 06/01/2022 1537   GFRAA 106 05/18/2019 0828   CrCl cannot be calculated (Patient's most recent lab result is older than the maximum 21 days allowed.).  COAG No results found for: "INR", "PROTIME"  Radiology CT CHEST LUNG CA SCREEN  LOW DOSE W/O CM Result Date: 05/03/2023 CLINICAL DATA:  89 pack-year smoking history/current smoker EXAM: CT CHEST WITHOUT CONTRAST LOW-DOSE FOR LUNG CANCER SCREENING TECHNIQUE: Multidetector CT imaging of the chest was performed following the standard protocol without IV contrast. RADIATION DOSE REDUCTION: This exam was performed according to the departmental dose-optimization program which includes automated exposure control, adjustment of the mA and/or kV according to patient size and/or use of iterative reconstruction technique. COMPARISON:  04/23/2022 FINDINGS: Cardiovascular: Aortic atherosclerosis. Normal heart size, without pericardial effusion. Lad coronary artery calcification. Mediastinum/Nodes: No mediastinal or hilar adenopathy, given limitations of unenhanced CT. Lungs/Pleura: No pleural fluid. Mild centrilobular emphysema. No change in pulmonary nodules of maximally 3.3 mm. Upper Abdomen: Cholecystectomy. Normal imaged portions of the liver, spleen, stomach, pancreas, adrenal glands, kidneys. Musculoskeletal: No acute osseous abnormality. IMPRESSION: 1. Lung-RADS 2, benign appearance or behavior. Continue annual screening with low-dose chest CT without contrast in 12 months. 2. Aortic Atherosclerosis (ICD10-I70.0) and Emphysema (ICD10-J43.9). 3. Age advanced coronary artery atherosclerosis. Recommend assessment of coronary risk factors.  Electronically Signed   By: Jeronimo Greaves M.D.   On: 05/03/2023 08:25     Assessment/Plan There are no diagnoses linked to this encounter.   Levora Dredge, MD  05/14/2023 4:14 PM

## 2023-05-16 ENCOUNTER — Ambulatory Visit (INDEPENDENT_AMBULATORY_CARE_PROVIDER_SITE_OTHER): Payer: 59

## 2023-05-16 ENCOUNTER — Encounter (INDEPENDENT_AMBULATORY_CARE_PROVIDER_SITE_OTHER): Payer: Self-pay | Admitting: Vascular Surgery

## 2023-05-16 ENCOUNTER — Ambulatory Visit (INDEPENDENT_AMBULATORY_CARE_PROVIDER_SITE_OTHER): Payer: BC Managed Care – PPO | Admitting: Vascular Surgery

## 2023-05-16 VITALS — BP 136/82 | HR 91 | Resp 16 | Wt 103.6 lb

## 2023-05-16 DIAGNOSIS — I6523 Occlusion and stenosis of bilateral carotid arteries: Secondary | ICD-10-CM | POA: Diagnosis not present

## 2023-05-16 DIAGNOSIS — I10 Essential (primary) hypertension: Secondary | ICD-10-CM

## 2023-05-16 DIAGNOSIS — I739 Peripheral vascular disease, unspecified: Secondary | ICD-10-CM | POA: Diagnosis not present

## 2023-05-16 DIAGNOSIS — I6522 Occlusion and stenosis of left carotid artery: Secondary | ICD-10-CM

## 2023-05-16 DIAGNOSIS — J432 Centrilobular emphysema: Secondary | ICD-10-CM | POA: Diagnosis not present

## 2023-05-16 DIAGNOSIS — E119 Type 2 diabetes mellitus without complications: Secondary | ICD-10-CM

## 2023-05-17 ENCOUNTER — Encounter (INDEPENDENT_AMBULATORY_CARE_PROVIDER_SITE_OTHER): Payer: Self-pay | Admitting: Vascular Surgery

## 2023-05-20 ENCOUNTER — Other Ambulatory Visit: Payer: Self-pay | Admitting: Cardiovascular Disease

## 2023-05-20 ENCOUNTER — Ambulatory Visit: Payer: 59 | Admitting: Nurse Practitioner

## 2023-05-21 IMAGING — CT CT CHEST LUNG CANCER SCREENING LOW DOSE W/O CM
2 of 5 series · 15 of 40 positions shown, 18 images · non-contrast
Comparison: 10/11/2019.

CLINICAL DATA: Current smoker, 87 pack-year history.



[Series 3: lung 1.00 · axial · 0.62mm/px · z∈[-1223,-926]mm · 12 of 327 slices shown, 15 images]
[im 15/327  mediastinal]
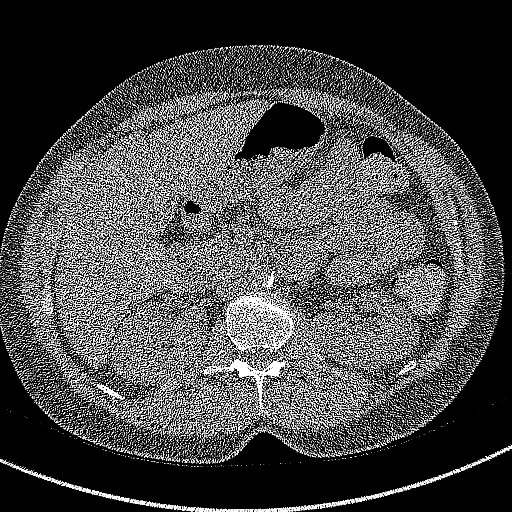
[im 15/327  lung]
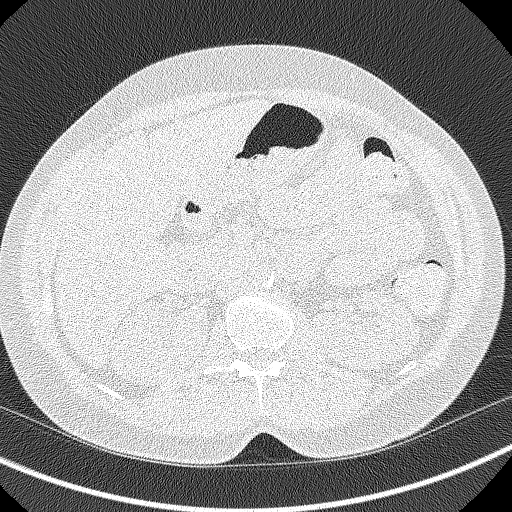
[im 45/327  lung]
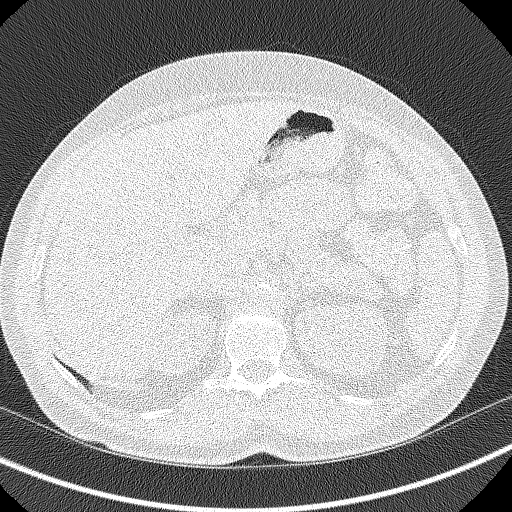
[im 75/327  lung]
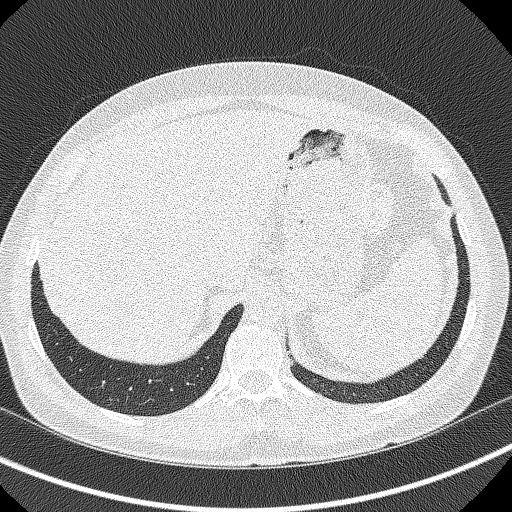
[im 104/327  lung]
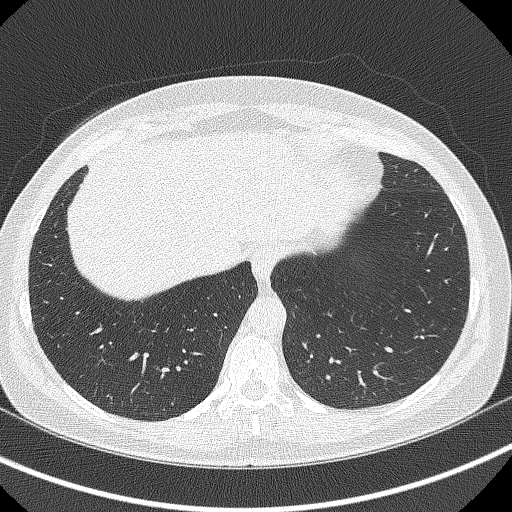
[im 119/327  mediastinal]
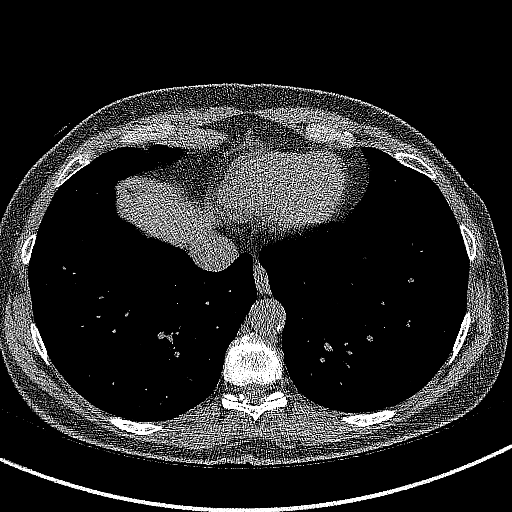
[im 119/327  lung]
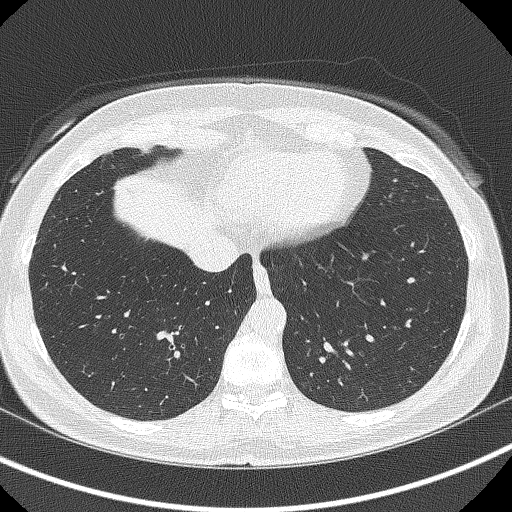
[im 149/327  lung]
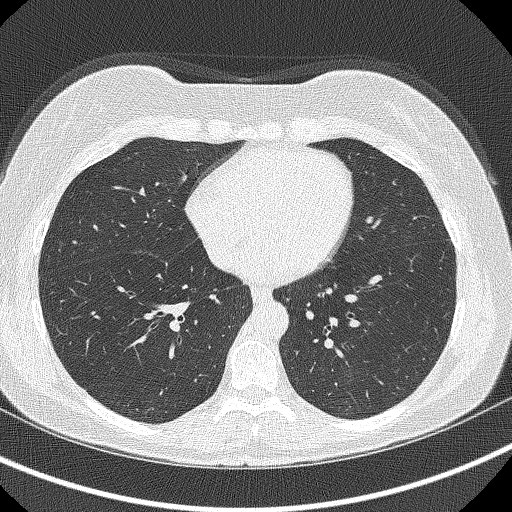
[im 178/327  lung]
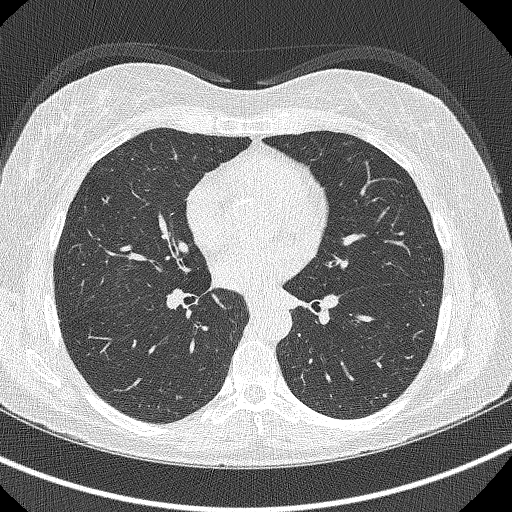
[im 208/327  lung]
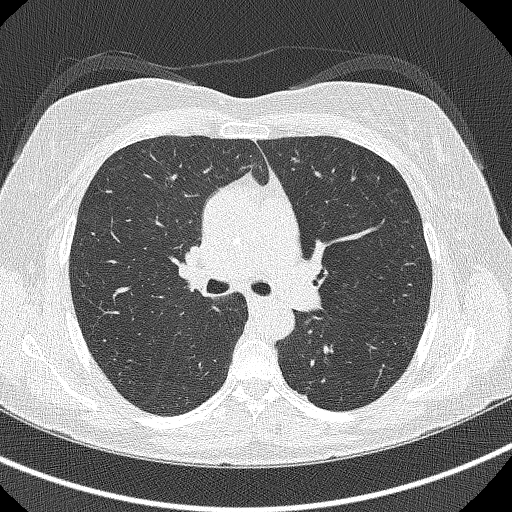
[im 223/327  mediastinal]
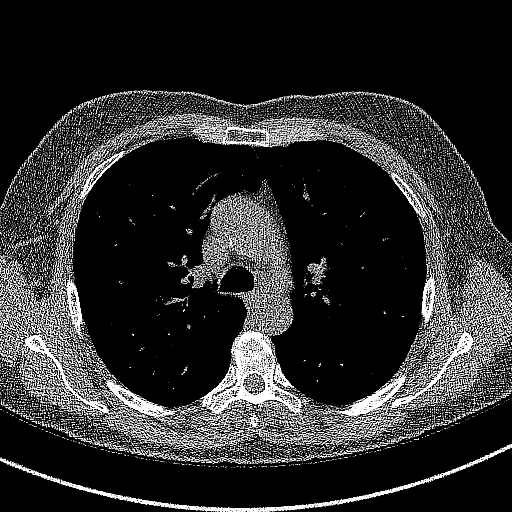
[im 223/327  lung]
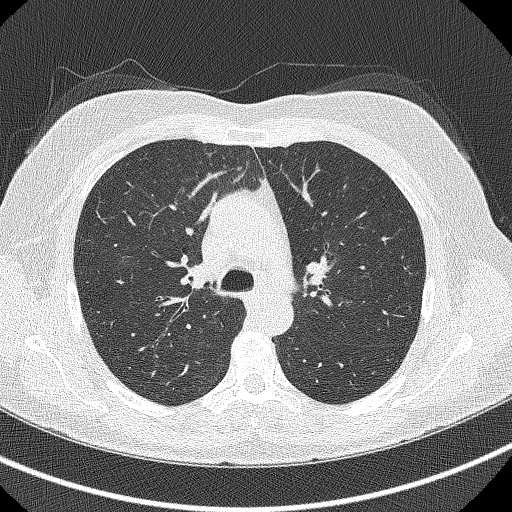
[im 252/327  lung]
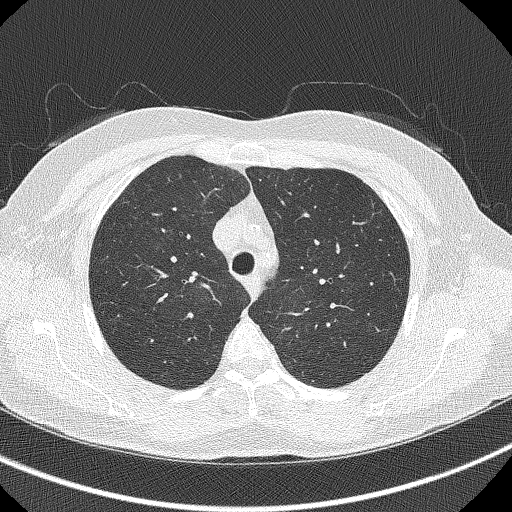
[im 282/327  lung]
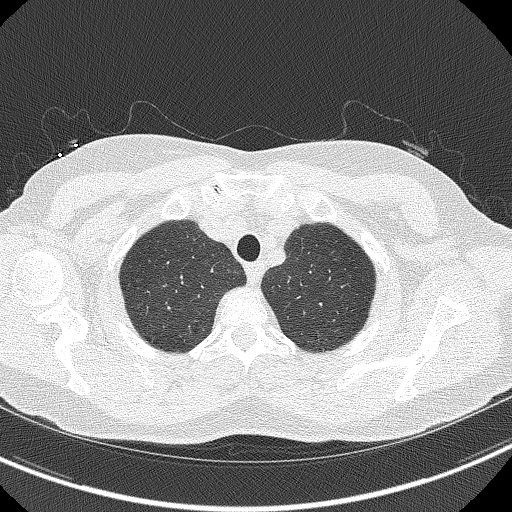
[im 312/327  lung]
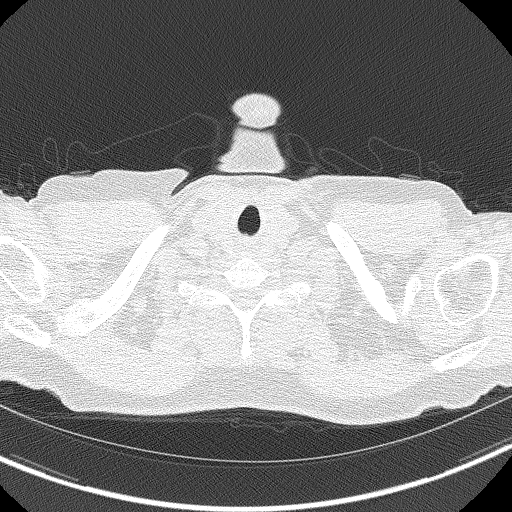

[Series 5: coronals lung 1.00 cor · coronal · 0.62mm/px · 3 of 304 slices shown]
[im 61/304  lung]
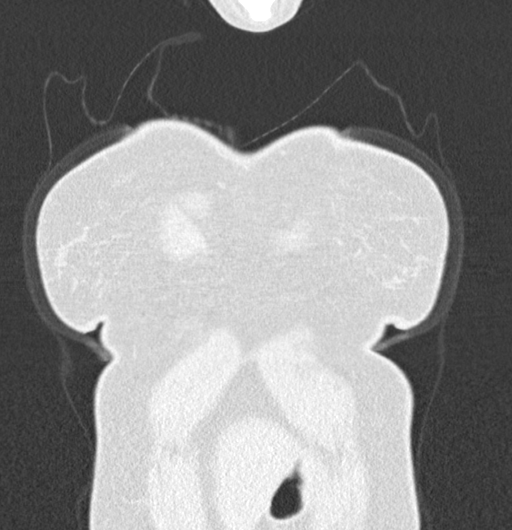
[im 122/304  lung]
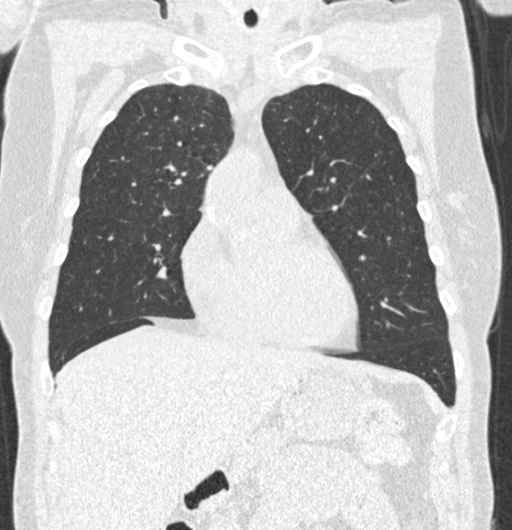
[im 182/304  lung]
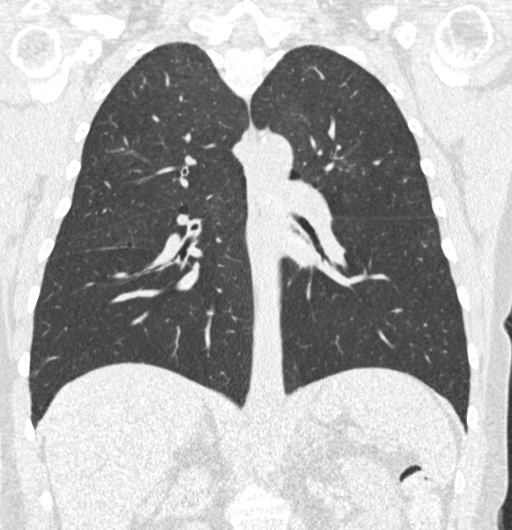

[15 of 40 positions shown; findings below may reference images not displayed]

FINDINGS: Cardiovascular: Atherosclerotic calcification of the aorta and
aortic valve. Heart size normal. No pericardial effusion.

Mediastinum/Nodes: No pathologically enlarged mediastinal or
axillary lymph nodes. Hilar regions are difficult to definitively
evaluate without IV contrast. Esophagus is grossly unremarkable.

Lungs/Pleura: Centrilobular and paraseptal emphysema. Smoking
related respiratory bronchiolitis. Pulmonary nodules measure 3.5 mm
or less in size, as before. No new pulmonary nodules. No pleural
fluid. Airway is unremarkable.

Upper Abdomen: Visualized portions of the liver, adrenal glands,
kidneys, spleen, pancreas, stomach and bowel are grossly
unremarkable. Cholecystectomy.

Musculoskeletal: No worrisome lytic or sclerotic lesions.
IMPRESSION: 1. Lung-RADS 2, benign appearance or behavior. Continue annual
screening with low-dose chest CT without contrast in 12 months.
2. Hypodense lesion seen in the interpolar left kidney on comparison
exam is not visualized are not imaged.
3.  Aortic atherosclerosis (CN6MS-HT0.0).
4.  Emphysema (CN6MS-OKA.Y).

## 2023-05-30 ENCOUNTER — Other Ambulatory Visit: Payer: Self-pay | Admitting: Family Medicine

## 2023-05-30 DIAGNOSIS — E119 Type 2 diabetes mellitus without complications: Secondary | ICD-10-CM

## 2023-05-31 NOTE — Telephone Encounter (Signed)
 Dosage changed on 03/25/23, will refuse this request.  Requested Prescriptions  Pending Prescriptions Disp Refills   RYBELSUS 3 MG TABS [Pharmacy Med Name: RYBELSUS 3 MG TABLET] 30 tablet 1    Sig: TAKE 1 TABLET BY MOUTH DAILY     Off-Protocol Failed - 05/31/2023 11:11 AM      Failed - Medication not assigned to a protocol, review manually.      Passed - Valid encounter within last 12 months    Recent Outpatient Visits           5 months ago Diabetes mellitus without complication [facility])   [place] [facility] Malva Limes, MD   1 year ago Diabetes mellitus without complication [facility])   [place] [facility] Malva Limes, MD   1 year ago Acute exacerbation of emphysema [facility])   [place] [facility] Alfredia Ferguson, PA-C   1 year ago Acute exacerbation of emphysema [facility])   [place] [facility] Alfredia Ferguson, PA-C   1 year ago Diabetes mellitus without complication [facility])   [place] [facility] Malva Limes, MD       Future Appointments             In 1 month [name], Tollie Pizza, MD [facility]

## 2023-06-01 ENCOUNTER — Other Ambulatory Visit: Payer: Self-pay | Admitting: Family Medicine

## 2023-06-01 DIAGNOSIS — I10 Essential (primary) hypertension: Secondary | ICD-10-CM

## 2023-06-24 ENCOUNTER — Other Ambulatory Visit: Payer: Self-pay | Admitting: Family Medicine

## 2023-06-24 DIAGNOSIS — J069 Acute upper respiratory infection, unspecified: Secondary | ICD-10-CM

## 2023-06-30 ENCOUNTER — Other Ambulatory Visit: Payer: Self-pay | Admitting: Family Medicine

## 2023-06-30 DIAGNOSIS — E119 Type 2 diabetes mellitus without complications: Secondary | ICD-10-CM

## 2023-07-20 ENCOUNTER — Other Ambulatory Visit: Payer: Self-pay | Admitting: Family Medicine

## 2023-07-20 DIAGNOSIS — E785 Hyperlipidemia, unspecified: Secondary | ICD-10-CM

## 2023-07-20 DIAGNOSIS — F410 Panic disorder [episodic paroxysmal anxiety] without agoraphobia: Secondary | ICD-10-CM

## 2023-07-20 DIAGNOSIS — E119 Type 2 diabetes mellitus without complications: Secondary | ICD-10-CM

## 2023-07-22 ENCOUNTER — Ambulatory Visit: Payer: 59 | Admitting: Cardiovascular Disease

## 2023-08-02 ENCOUNTER — Encounter: Payer: Self-pay | Admitting: Gastroenterology

## 2023-08-02 ENCOUNTER — Ambulatory Visit: Admitting: Gastroenterology

## 2023-08-02 VITALS — BP 193/85 | HR 92 | Temp 98.0°F | Wt 104.0 lb

## 2023-08-02 DIAGNOSIS — R1013 Epigastric pain: Secondary | ICD-10-CM | POA: Diagnosis not present

## 2023-08-02 DIAGNOSIS — K5909 Other constipation: Secondary | ICD-10-CM | POA: Diagnosis not present

## 2023-08-02 DIAGNOSIS — K219 Gastro-esophageal reflux disease without esophagitis: Secondary | ICD-10-CM

## 2023-08-02 DIAGNOSIS — K5904 Chronic idiopathic constipation: Secondary | ICD-10-CM

## 2023-08-02 MED ORDER — LINACLOTIDE 72 MCG PO CAPS
ORAL_CAPSULE | ORAL | Status: DC
Start: 2023-08-02 — End: 2023-12-16

## 2023-08-02 NOTE — Progress Notes (Signed)
 Karma Oz, MD [address]  [address]  [place], [place] [address]  Main: [phone]  Fax: [phone]    Gastroenterology Consultation  Referring Provider:     Lamon Pillow, MD Primary Care Physician:  Lamon Pillow, MD Primary Gastroenterologist:  Dr. Ellis Guys Reason for Consultation: Chronic constipation, epigastric burning, GERD        HPI:   Dana Weber is a 63 y.o. female referred by Dr. Lamon Pillow, MD  for consultation & management of chronic constipation.  She has tried Ex-Lax, MiraLAX, magnesium citrate without much benefit.  She reports having bowel movement every 7 or 8 days associated with straining, hard stools and incomplete emptying.  She has 1 bowel movement today.  She reports feeling bloated.  She also complains of epigastric discomfort and burning in her throat, takes omeprazole  20 mg daily.  She continues to smoke tobacco, she does drink 2 bottles of Dr. Kathlene Paradise daily and drinks 2 glasses of water  only.  Her weight has been stable.  Her A1c is improving. Patient is accompanied by her husband today  NSAIDs: None  Antiplts/Anticoagulants/Anti thrombotics: None  GI Procedures:  EGD and colonoscopy 03/10/2015 Diagnosis 1. Stomach, biopsy, gastric - CHRONIC GASTRITIS. - A WARTHIN-STARRY STAIN IS NEGATIVE FOR HELICOBACTER PYLORI. - NEGATIVE FOR INTESTINAL METAPLASIA OR MALIGNANCY. 2. Ileum, biopsy, terminal - BENIGN TERMINAL ILEAL MUCOSA WITH NO HISTOPATHOLOGIC ABNORMALITY. - NO ACTIVE OR CHRONIC INFLAMMATION, GRANULOMAS OR MALIGNANCY IDENTIFIED. 3. Colon, biopsy, random - BENIGN COLONIC MUCOSA WITH NO HISTOPATHOLOGIC ABNORMALITY. - NO EVIDENCE OF LYMPHOCYTIC OR COLLAGENOUS COLITIS, INFLAMMATORY BOWEL DISEASE OR MALIGNANCY.  Colonoscopy 11/14/2020 - Diverticulosis in the sigmoid colon. - Non-bleeding internal hemorrhoids. - No specimens collected.   Past Medical History:  Diagnosis Date   Carotid artery stenosis     Diabetes mellitus without complication (HCC)    Fatty liver    Headache    sinus   Hyperlipidemia    Hypertension    Multilevel degenerative disc disease    "spurs" on spine also   Neuropathy    hands and feet   Vertigo    mild - daily - positional   Wears dentures    partial lower    Past Surgical History:  Procedure Laterality Date   ABDOMINAL HYSTERECTOMY     CESAREAN SECTION     COLONOSCOPY WITH PROPOFOL  N/A 03/20/2015   Procedure: COLONOSCOPY WITH PROPOFOL ;  Surgeon: Marnee Sink, MD;  Location: [facility];  Service: Endoscopy;  Laterality: N/A;   COLONOSCOPY WITH PROPOFOL  N/A 11/14/2020   Procedure: COLONOSCOPY WITH PROPOFOL ;  Surgeon: Marnee Sink, MD;  Location: [facility];  Service: Endoscopy;  Laterality: N/A;  Diabetic - oral meds   DILATION AND CURETTAGE OF UTERUS     ESOPHAGOGASTRODUODENOSCOPY (EGD) WITH PROPOFOL  N/A 03/20/2015   Procedure: ESOPHAGOGASTRODUODENOSCOPY (EGD) WITH PROPOFOL ;  Surgeon: Marnee Sink, MD;  Location: [facility];  Service: Endoscopy;  Laterality: N/A;  Diabetic - oral meds   HEMORRHOID SURGERY     LAPAROSCOPIC CHOLECYSTECTOMY     LAPAROSCOPIC SUPRACERVICAL HYSTERECTOMY  2008   SALPINGOOPHORECTOMY Left 2008   TONSILLECTOMY AND ADENOIDECTOMY  1960    Current Outpatient Medications:    albuterol  (VENTOLIN  HFA) 108 (90 Base) MCG/ACT inhaler, INHALE 2 PUFFS BY MOUTH EVERY 6 HOURS AS NEEDED FOR WHEEZING OR SHORTNESS OF BREATH, Disp: 8.5 g, Rfl: 5   aspirin EC 81 MG tablet, Take 81 mg by mouth daily. Swallow whole., Disp: , Rfl:  benazepril  (LOTENSIN ) 5 MG tablet, TAKE 1 TABLET (5 MG TOTAL) BY MOUTH DAILY., Disp: 90 tablet, Rfl: 4   dapagliflozin  propanediol (FARXIGA ) 10 MG TABS tablet, Take 1 tablet (10 mg total) by mouth daily before breakfast., Disp: 90 tablet, Rfl: 4   ezetimibe  (ZETIA ) 10 MG tablet, TAKE 1 TABLET(10 MG) BY MOUTH DAILY, Disp: 90 tablet, Rfl: 0   fluticasone  (FLONASE ) 50 MCG/ACT nasal spray, SPRAY 2  SPRAYS INTO EACH NOSTRIL EVERY DAY, Disp: 48 mL, Rfl: 2   glipiZIDE  (GLUCOTROL  XL) 5 MG 24 hr tablet, TAKE 1 TABLET BY MOUTH EVERY DAY WITH BREAKFAST, Disp: 90 tablet, Rfl: 2   ivabradine  (CORLANOR) 5 MG TABS tablet, Take 3 tablets by mouth once 2 hours before CTA., Disp: 3 tablet, Rfl: 0   lovastatin  (MEVACOR ) 20 MG tablet, TAKE 1 TABLET BY MOUTH EVERY DAY, Disp: 90 tablet, Rfl: 4   meloxicam  (MOBIC ) 7.5 MG tablet, Take 1-2 tablets (7.5-15 mg total) by mouth daily as needed for pain., Disp: 30 tablet, Rfl: 4   metFORMIN  (GLUCOPHAGE -XR) 500 MG 24 hr tablet, TAKE 2 TABLETS BY MOUTH TWICE A DAY, Disp: 360 tablet, Rfl: 4   metoprolol  tartrate (LOPRESSOR ) 100 MG tablet, Take 1 tablet by mouth once 2 hours before CTA., Disp: 1 tablet, Rfl: 0   naproxen  (NAPROSYN ) 500 MG tablet, Take 1 tablet (500 mg total) by mouth 2 (two) times daily with a meal., Disp: 30 tablet, Rfl: 2   omeprazole  (PRILOSEC) 20 MG capsule, TAKE 1 CAPSULE BY MOUTH EVERY DAY, Disp: 90 capsule, Rfl: 1   PARoxetine  (PAXIL ) 40 MG tablet, TAKE 1 TABLET BY MOUTH EVERY DAY, Disp: 90 tablet, Rfl: 3   Semaglutide  (RYBELSUS ) 7 MG TABS, TAKE 1 TABLET (7 MG TOTAL) BY MOUTH DAILY, Disp: 30 tablet, Rfl: 5   Tiotropium Bromide Monohydrate  (SPIRIVA  RESPIMAT) 2.5 MCG/ACT AERS, INHALE 2 PUFFS BY MOUTH INTO THE LUNGS DAILY, Disp: 4 g, Rfl: 2   traZODone  (DESYREL ) 100 MG tablet, TAKE 1/2 TO 1 TABLET BY MOUTH AT BEDTIME, Disp: 90 tablet, Rfl: 0    Family History  Problem Relation Age of Onset   Diabetes Mother    Hypertension Mother    Diabetes Father    Hypertension Father    Hypertension Brother    Hypertension Brother    Diabetes Paternal Grandmother    Emphysema Paternal Grandfather      Social History   Tobacco Use   Smoking status: Some Days    Current packs/day: 0.25    Average packs/day: 0.3 packs/day for 42.0 years (10.5 ttl pk-yrs)    Types: Cigarettes   Smokeless tobacco: Never   Tobacco comments:    has a cigarette every  few days  Vaping Use   Vaping status: Former  Substance Use Topics   Alcohol use: Yes    Alcohol/week: 0.0 standard drinks of alcohol    Comment: OCCASIONALLY   Drug use: No    Allergies as of 08/02/2023 - Review Complete 05/17/2023  Allergen Reaction Noted   Hydrocodone Swelling 10/23/2018   Penicillins Swelling 10/23/2018   Amoxicillin Swelling 11/05/2014   Hydrocodone bitartrate er Swelling 11/05/2014   Wellbutrin [bupropion]  01/08/2019    Review of Systems:    All systems reviewed and negative except where noted in HPI.   Physical Exam:  There were no vitals taken for this visit. No LMP recorded. Patient has had a hysterectomy.  General:   Alert,  Well-developed, well-nourished, pleasant and cooperative in NAD Head:  Normocephalic and  atraumatic. Eyes:  Sclera clear, no icterus.   Conjunctiva pink. Ears:  Normal auditory acuity. Nose:  No deformity, discharge, or lesions. Mouth:  No deformity or lesions,oropharynx pink & moist. Neck:  Supple; no masses or thyromegaly. Lungs:  Respirations even and unlabored.  Clear throughout to auscultation.   No wheezes, crackles, or rhonchi. No acute distress. Heart:  Regular rate and rhythm; no murmurs, clicks, rubs, or gallops. Abdomen:  Normal bowel sounds. Soft, non-tender and mildly distended without masses, hepatosplenomegaly or hernias noted.  No guarding or rebound tenderness.   Rectal: Not performed Msk:  Symmetrical without gross deformities. Good, equal movement & strength bilaterally. Pulses:  Normal pulses noted. Extremities:  No clubbing or edema.  No cyanosis. Neurologic:  Alert and oriented x3;  grossly normal neurologically. Skin:  Intact without significant lesions or rashes. No jaundice. Psych:  Alert and cooperative. Normal mood and affect.  Imaging Studies: No abdominal imaging  Assessment and Plan:   Dana Weber is a 63 y.o. female with diabetes, hypertension, symptomatic hemorrhoids status post  hemorrhoid ligation 11/08/2020 is seen in consultation for management of chronic constipation and GERD  Chronic constipation Advised to increase MiraLAX to 34 g with large cup of water  daily If this does not work, recommend Linzess  145 mcg daily, samples provided Reiterated to increase water  intake, eliminate carbonated beverages, increase fiber intake and  physical activity  Epigastric discomfort Recommend to increase omeprazole  to 20 mg twice daily before meals Discussed about antireflux lifestyle  Follow up as needed   Karma Oz, MD

## 2023-08-09 ENCOUNTER — Encounter (INDEPENDENT_AMBULATORY_CARE_PROVIDER_SITE_OTHER): Payer: Self-pay

## 2023-08-29 ENCOUNTER — Other Ambulatory Visit: Payer: Self-pay | Admitting: Family Medicine

## 2023-09-02 IMAGING — US US ABDOMEN COMPLETE
1 series · 14 of 25 positions shown · non-contrast
Comparison: Renal ultrasound 09/05/2018

CLINICAL DATA: Abdominal pain

EXAM:
ABDOMEN ULTRASOUND COMPLETE

[Series 1: us abdomen complete · 0.22mm/px · 14 of 54 slices shown]
[im 1/54]
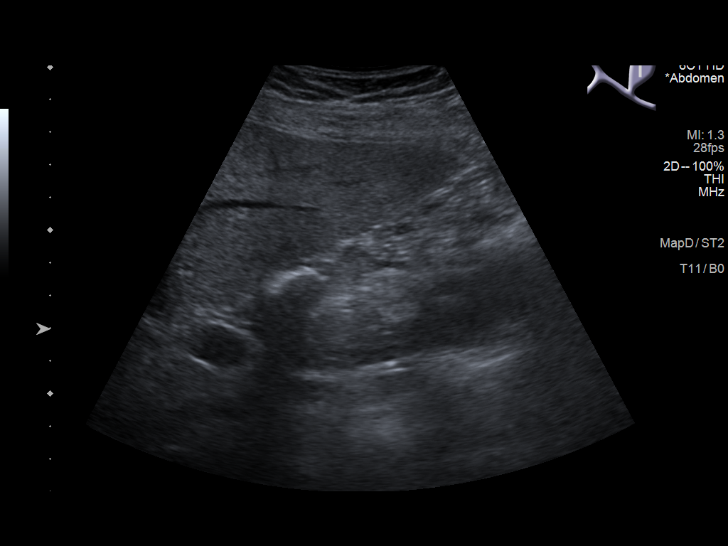
[im 5/54]
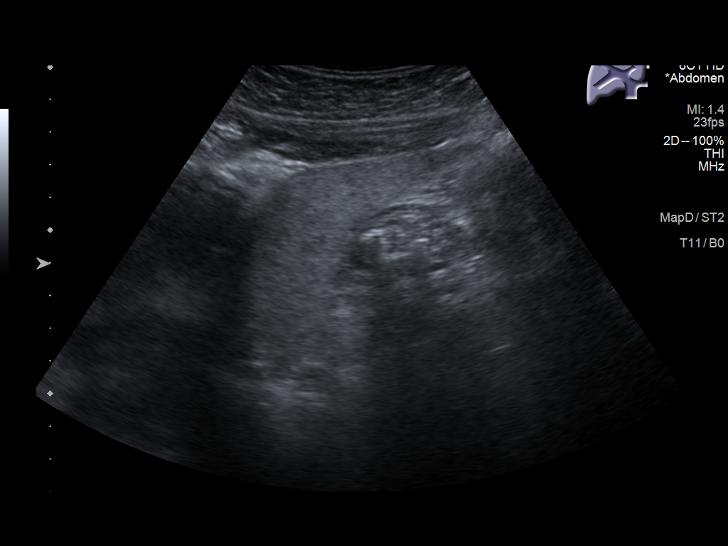
[im 9/54]
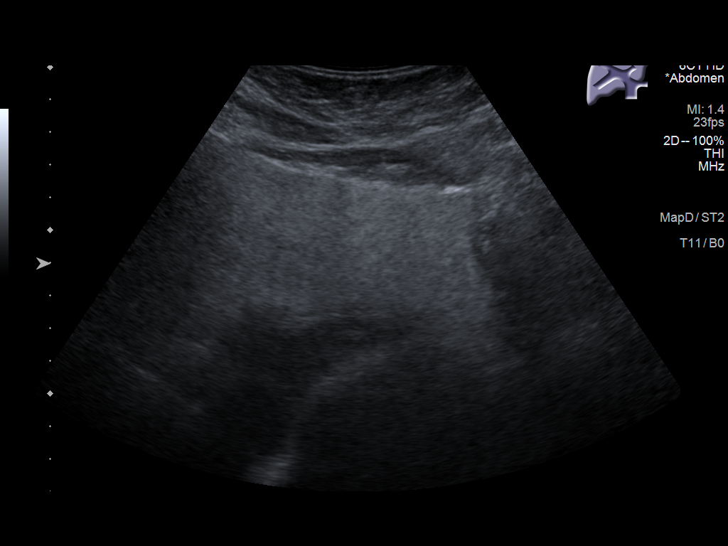
[im 14/54]
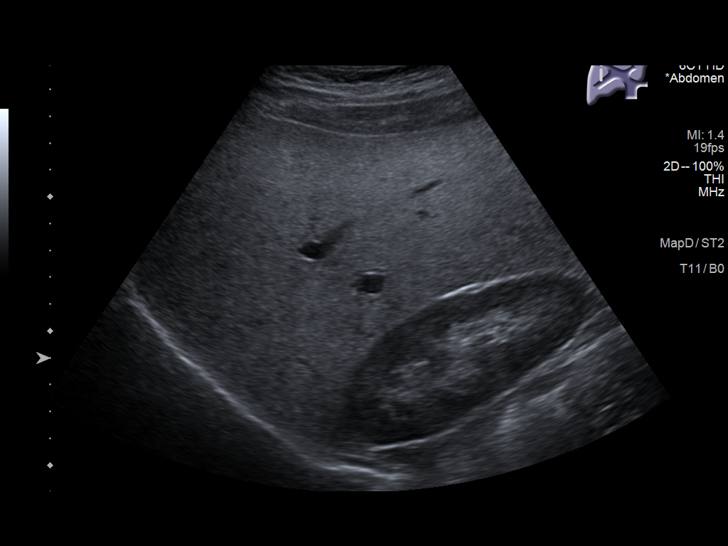
[im 18/54]
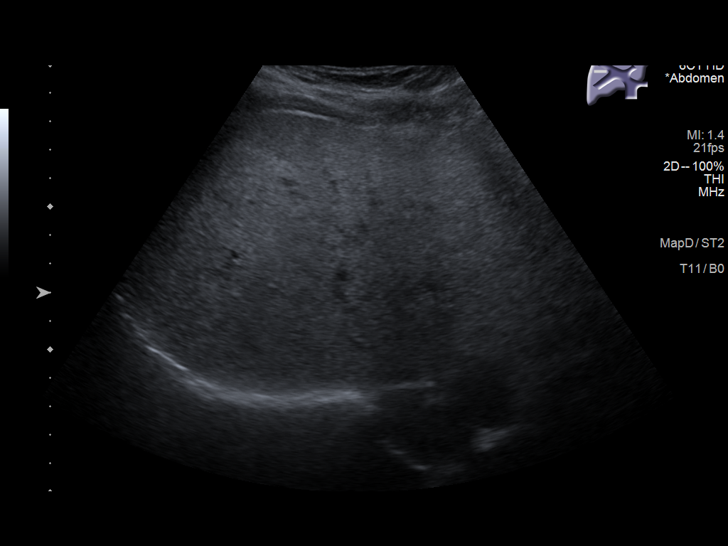
[im 20/54]
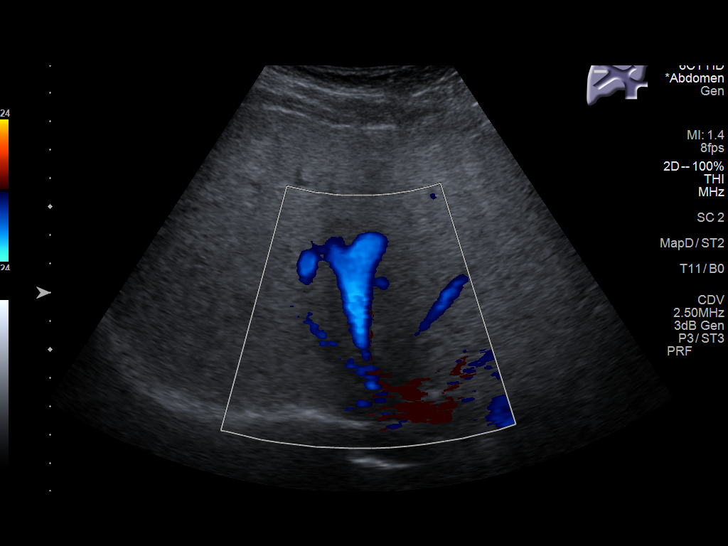
[im 25/54]
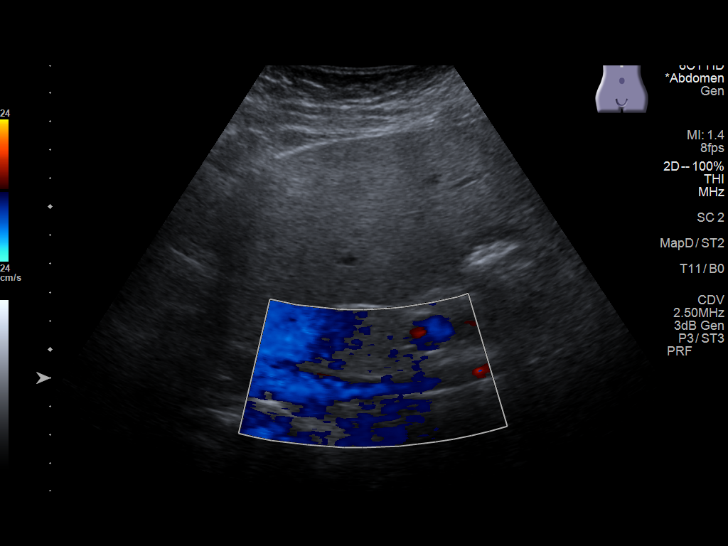
[im 29/54]
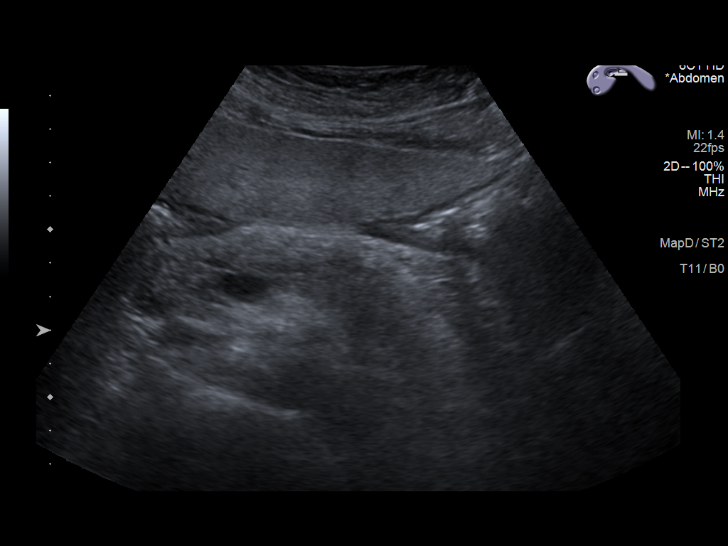
[im 34/54]
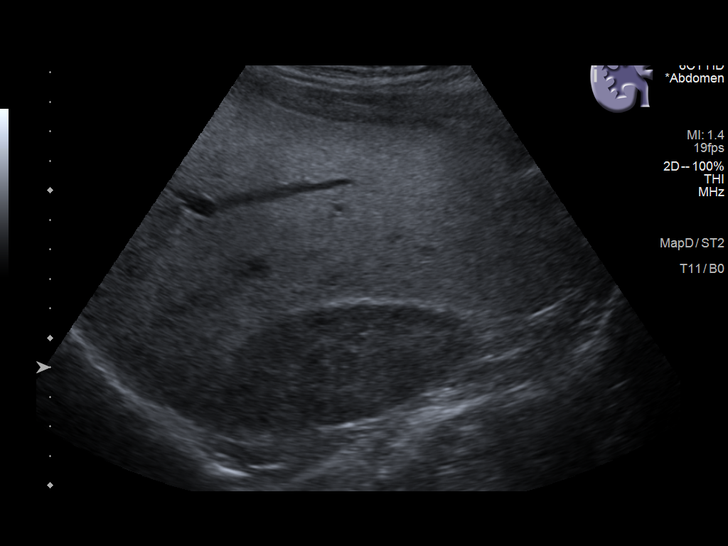
[im 36/54]
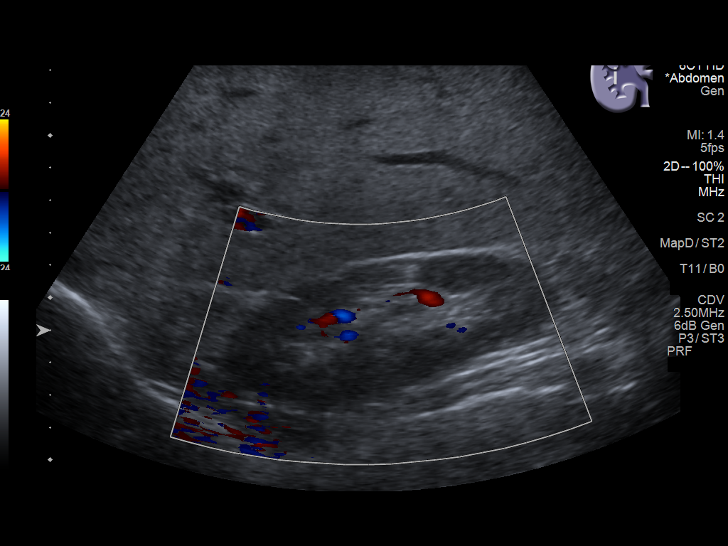
[im 40/54]
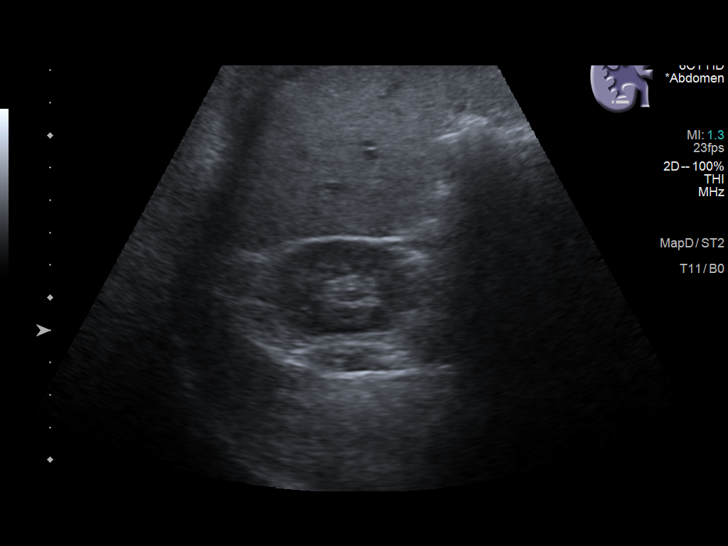
[im 45/54]
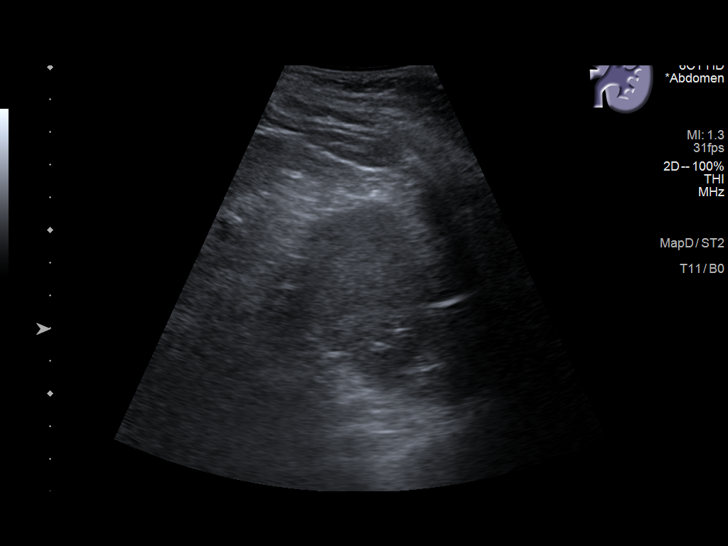
[im 49/54]
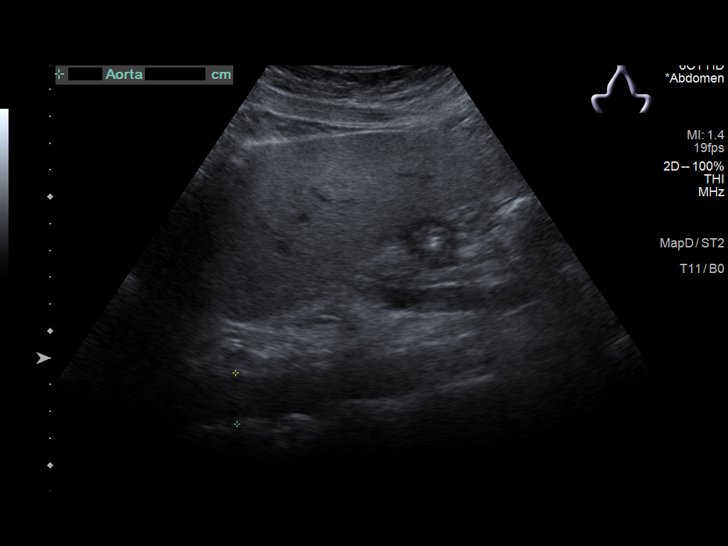
[im 54/54]
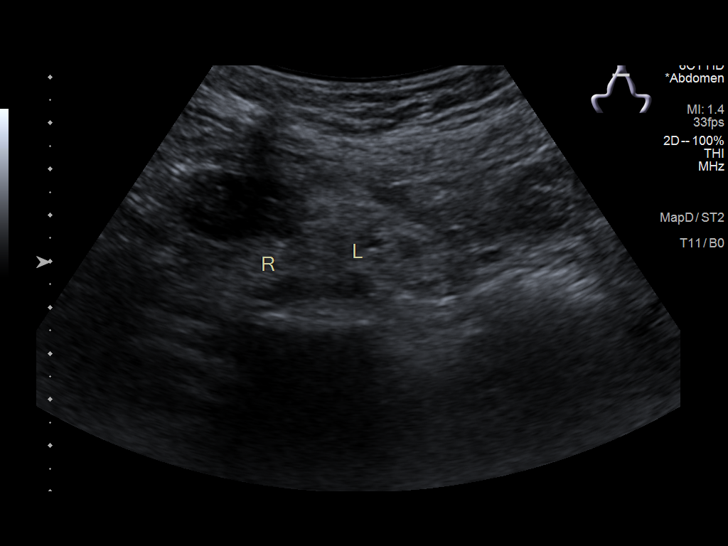

[14 of 25 positions shown; findings below may reference images not displayed]

FINDINGS: Gallbladder: Surgically absent.

Common bile duct: Diameter: 5 mm

Liver: Diffuse increased echogenicity of the parenchyma with no
focal mass identified. Portal vein is patent on color Doppler
imaging with normal direction of blood flow towards the liver.

IVC: No abnormality visualized.

Pancreas: Visualized portion unremarkable.

Spleen: Size and appearance within normal limits.

Right Kidney: Length: 10.4 cm. Echogenicity within normal limits. No
mass or hydronephrosis visualized.

Left Kidney: Length: 10.2 cm. Echogenicity within normal limits. No
mass or hydronephrosis visualized.

Abdominal aorta: No aneurysm visualized.

Other findings: None.
IMPRESSION: Diffuse increased echogenicity of the liver parenchyma which may
represent hepatic steatosis and/or other hepatocellular disease.

## 2023-09-25 ENCOUNTER — Other Ambulatory Visit: Payer: Self-pay | Admitting: Family Medicine

## 2023-10-04 ENCOUNTER — Ambulatory Visit: Admitting: Cardiology

## 2023-10-05 ENCOUNTER — Other Ambulatory Visit: Payer: Self-pay | Admitting: Family Medicine

## 2023-10-05 DIAGNOSIS — E119 Type 2 diabetes mellitus without complications: Secondary | ICD-10-CM

## 2023-10-05 DIAGNOSIS — R0600 Dyspnea, unspecified: Secondary | ICD-10-CM

## 2023-10-18 NOTE — Progress Notes (Unsigned)
 Cardiology Office Note    Date:  10/19/2023   ID:  Dana, Weber 1960/04/02, MRN [id number]  PCP:  Dana Nancyann BRAVO, MD  Cardiologist:  Evalene Lunger, MD  Electrophysiologist:  None   Chief Complaint: Follow-up  History of Present Illness:   Dana Weber is a 63 y.o. female with history of nonobstructive CAD by coronary CTA in 05/2022, aortic atherosclerosis, DM2, HTN, HLD, carotid artery stenosis followed by vascular surgery, and tobacco use who presents for follow-up of CAD.  She was initially and last seen by Dr. [name] in 04/2022 for evaluation of chest pain and aortic atherosclerosis.  At that time she reported nagging chest pain.  She reported being on numerous medications for diabetes, though had struggled to improve A1c.  ABI normal bilaterally in 04/2022.  CT of the chest from 04/2022 was reviewed that showed coronary artery calcification and moderate aortic atherosclerosis.  Subsequent coronary CTA in 05/2022 showed a calcium score of 66.1 which was the 84th percentile.  There was 25% stenosis involving the proximal LAD.  Most recent carotid artery ultrasound from 04/2023 showed 1 to 39% right ICA stenosis with improved 40 to 59% left ICA stenosis (previously 60 to 79% stenosed in 10/2022).  She comes in today and is without symptoms of angina or cardiac decompensation.  No dyspnea, palpitations, dizziness, presyncope, or syncope.  She does note an intermittent randomly occurring sensation of an ache in the epigastric region.  Prior abdominal ultrasound in 12/2022 without evidence of aortic aneurysm.  No palpitations.  No falls or symptoms concerning for bleeding.  She does continue to smoke though is not yet ready to quit in the setting of significant family stressors more recently which she also believes may be contributing to her elevated blood pressure.  Lastly, she notes issues with constipation.   Labs independently reviewed: 11/2022 - A1c 7.4, TC 116, TG 88, HDL  46, LDL 53, BUN 8, serum creatinine 0.51, potassium 4.1, BUN 4.1, AST/ALT normal, Hgb 13.9, PLT 329 03/2022 - TSH normal, BNP 8.8  Past Medical History:  Diagnosis Date   Carotid artery stenosis    Diabetes mellitus without complication (HCC)    Fatty liver    Headache    sinus   Hyperlipidemia    Hypertension    Multilevel degenerative disc disease    spurs on spine also   Neuropathy    hands and feet   Vertigo    mild - daily - positional   Wears dentures    partial lower    Past Surgical History:  Procedure Laterality Date   ABDOMINAL HYSTERECTOMY     CESAREAN SECTION     COLONOSCOPY WITH PROPOFOL  N/A 03/20/2015   Procedure: COLONOSCOPY WITH PROPOFOL ;  Surgeon: Rogelia Copping, MD;  Location: [facility];  Service: Endoscopy;  Laterality: N/A;   COLONOSCOPY WITH PROPOFOL  N/A 11/14/2020   Procedure: COLONOSCOPY WITH PROPOFOL ;  Surgeon: Copping Rogelia, MD;  Location: [facility];  Service: Endoscopy;  Laterality: N/A;  Diabetic - oral meds   DILATION AND CURETTAGE OF UTERUS     ESOPHAGOGASTRODUODENOSCOPY (EGD) WITH PROPOFOL  N/A 03/20/2015   Procedure: ESOPHAGOGASTRODUODENOSCOPY (EGD) WITH PROPOFOL ;  Surgeon: Rogelia Copping, MD;  Location: [facility];  Service: Endoscopy;  Laterality: N/A;  Diabetic - oral meds   HEMORRHOID SURGERY     LAPAROSCOPIC CHOLECYSTECTOMY     LAPAROSCOPIC SUPRACERVICAL HYSTERECTOMY  2008   SALPINGOOPHORECTOMY Left 2008   TONSILLECTOMY AND ADENOIDECTOMY  1960  Current Medications: Current Meds  Medication Sig   albuterol  (VENTOLIN  HFA) 108 (90 Base) MCG/ACT inhaler INHALE 2 PUFFS BY MOUTH EVERY 6 HOURS AS NEEDED FOR WHEEZING OR SHORTNESS OF BREATH   aspirin EC 81 MG tablet Take 81 mg by mouth daily. Swallow whole.   benazepril  (LOTENSIN ) 5 MG tablet TAKE 1 TABLET (5 MG TOTAL) BY MOUTH DAILY.   dapagliflozin  propanediol (FARXIGA ) 10 MG TABS tablet Take 1 tablet (10 mg total) by mouth daily before breakfast.   ezetimibe  (ZETIA )  10 MG tablet TAKE 1 TABLET(10 MG) BY MOUTH DAILY   glipiZIDE  (GLUCOTROL  XL) 5 MG 24 hr tablet TAKE 1 TABLET BY MOUTH EVERY DAY WITH BREAKFAST   lovastatin  (MEVACOR ) 20 MG tablet TAKE 1 TABLET BY MOUTH EVERY DAY   metFORMIN  (GLUCOPHAGE -XR) 500 MG 24 hr tablet TAKE 2 TABLETS BY MOUTH TWICE A DAY   PARoxetine  (PAXIL ) 40 MG tablet TAKE 1 TABLET BY MOUTH EVERY DAY   Semaglutide  (RYBELSUS ) 7 MG TABS TAKE 1 TABLET (7 MG TOTAL) BY MOUTH DAILY   Tiotropium Bromide Monohydrate  (SPIRIVA  RESPIMAT) 2.5 MCG/ACT AERS INHALE 2 PUFFS BY MOUTH INTO THE LUNGS DAILY    Allergies:   Hydrocodone, Penicillins, Amoxicillin, Hydrocodone bitartrate er, and Wellbutrin [bupropion]   Social History   Socioeconomic History   Marital status: Married    Spouse name: Not on file   Number of children: Not on file   Years of education: Not on file   Highest education level: Associate degree: academic program  Occupational History   Not on file  Tobacco Use   Smoking status: Some Days    Current packs/day: 0.25    Average packs/day: 0.3 packs/day for 42.0 years (10.5 ttl pk-yrs)    Types: Cigarettes   Smokeless tobacco: Never   Tobacco comments:    has a cigarette every few days  Vaping Use   Vaping status: Former  Substance and Sexual Activity   Alcohol use: Yes    Alcohol/week: 0.0 standard drinks of alcohol    Comment: OCCASIONALLY   Drug use: No   Sexual activity: Yes    Birth control/protection: Surgical    Comment: hysterectomy   Other Topics Concern   Not on file  Social History Narrative   Not on file   Social Drivers of Health   Financial Resource Strain: Low Risk  (11/20/2022)   Overall Financial Resource Strain (CARDIA)    Difficulty of Paying Living Expenses: Not hard at all  Food Insecurity: No Food Insecurity (11/20/2022)   Hunger Vital Sign    Worried About Running Out of Food in the Last Year: Never true    Ran Out of Food in the Last Year: Never true  Transportation Needs: No  Transportation Needs (11/20/2022)   PRAPARE - Administrator, Civil Service (Medical): No    Lack of Transportation (Non-Medical): No  Physical Activity: Unknown (11/20/2022)   Exercise Vital Sign    Days of Exercise per Week: 0 days    Minutes of Exercise per Session: Not on file  Stress: Stress Concern Present (11/20/2022)   [employer] of Occupational Health - Occupational Stress Questionnaire    Feeling of Stress : Rather much  Social Connections: Socially Integrated (11/20/2022)   Social Connection and Isolation Panel    Frequency of Communication with Friends and Family: More than three times a week    Frequency of Social Gatherings with Friends and Family: Once a week    Attends Religious Services: More  than 4 times per year    Active Member of Clubs or Organizations: Yes    Attends Engineer, structural: More than 4 times per year    Marital Status: Married     Family History:  The patient's family history includes Diabetes in her father, mother, and paternal grandmother; Emphysema in her paternal grandfather; Hypertension in her brother, brother, father, and mother.  ROS:   12-point review of systems is negative unless otherwise noted in the HPI.   EKGs/Labs/Other Studies Reviewed:    Studies reviewed were summarized above. The additional studies were reviewed today:  Coronary CTA 06/14/2022: FINDINGS: Aorta: Normal size. Mild ascending and descending aortic wall calcifications. No dissection.   Aortic Valve:  Trileaflet.  Mild calcifications.   Coronary Arteries:  Normal coronary origin.  Right dominance.   RCA is a dominant artery. There is no plaque.   Left main gives rise to LAD and LCX arteries. LM has no disease.   LAD has calcified plaque proximally causing mild stenosis (25%).   LCX is a non-dominant artery.  There is no plaque.   Other findings:   Normal pulmonary vein drainage into the left atrium.   Normal left atrial  appendage without a thrombus.   Normal size of the pulmonary artery.   IMPRESSION: 1. Coronary calcium score of 66.1. This was 84th percentile for age and sex matched control. 2. Normal coronary origin with right dominance. 3. Calcified plaque in the proximal LAD causing mild stenosis (25%) 4. CAD-RADS 2. Mild non-obstructive CAD (25-49%). Consider non-atherosclerotic causes of chest pain. Consider preventive therapy and risk factor modification. __________  Carotid artery ultrasound 05/12/2023: Summary:  Right Carotid: Velocities in the right ICA are consistent with a 1-39%  stenosis.  Non-hemodynamically significant plaque <50% noted in the  CCA. The ECA appears <50% stenosed.   Left Carotid: Velocities in the left ICA are consistent with a 40-59%  stenosis.  Non-hemodynamically significant plaque <50% noted in the  CCA. The ECA appears <50% stenosed. Improved flow through ICA  compared to previous study of 10/2022.   Vertebrals:  Bilateral vertebral arteries demonstrate antegrade flow.  Subclavians: Normal flow hemodynamics were seen in bilateral subclavian arteries.    EKG:  EKG is ordered today.  The EKG ordered today demonstrates NSR, 89 bpm, no acute ST-T changes  Recent Labs: 12/17/2022: ALT 18; BUN 8; Creatinine, Ser 0.51; Hemoglobin 13.9; Platelets 329; Potassium 4.1; Sodium 140  Recent Lipid Panel    Component Value Date/Time   CHOL 116 12/17/2022 1625   TRIG 88 12/17/2022 1625   HDL 46 12/17/2022 1625   CHOLHDL 2.5 12/17/2022 1625   LDLCALC 53 12/17/2022 1625    PHYSICAL EXAM:    VS:  BP (!) 168/82   Pulse 89   Ht 5' (1.524 m)   Wt 106 lb 6.4 oz (48.3 kg)   SpO2 96%   BMI 20.78 kg/m   BMI: Body mass index is 20.78 kg/m.  Physical Exam Vitals reviewed.  Constitutional:      Appearance: She is well-developed.  HENT:     Head: Normocephalic and atraumatic.  Eyes:     General:        Right eye: No discharge.        Left eye: No discharge.   Cardiovascular:     Rate and Rhythm: Normal rate and regular rhythm.     Pulses:          Carotid pulses are  on the left  side with bruit.    Heart sounds: Normal heart sounds, S1 normal and S2 normal. Heart sounds not distant. No midsystolic click and no opening snap. No murmur heard.    No friction rub.  Pulmonary:     Effort: Pulmonary effort is normal. No respiratory distress.     Breath sounds: Normal breath sounds. No decreased breath sounds, wheezing, rhonchi or rales.  Musculoskeletal:     Cervical back: Normal range of motion.     Right lower leg: No edema.     Left lower leg: No edema.  Skin:    General: Skin is warm and dry.     Nails: There is no clubbing.  Neurological:     Mental Status: She is alert and oriented to person, place, and time.  Psychiatric:        Speech: Speech normal.        Behavior: Behavior normal.        Thought Content: Thought content normal.        Judgment: Judgment normal.     Wt Readings from Last 3 Encounters:  10/19/23 106 lb 6.4 oz (48.3 kg)  08/02/23 104 lb (47.2 kg)  05/16/23 103 lb 9.6 oz (47 kg)     ASSESSMENT & PLAN:   Nonobstructive CAD: She is without symptoms of angina or cardiac decompensation.  Coronary CTA in 05/2022 showed 25% LAD stenosis.  Continue aggressive risk factor modification and primary prevention including aspirin 81 mg and lovastatin  20 mg.  Obtain echo to evaluate for any structural abnormalities.  Aortic atherosclerosis/HLD: LDL 53 in 11/2022.  She remains on lovastatin  20 mg.  HTN: Blood pressure is elevated in the office today though she is under increased stress surrounding a family matter.  We will continue to monitor blood pressure and escalate antihypertensive therapy as indicated and follow-up.  For now she remains on benazepril  5 mg daily.  Carotid artery stenosis: Most recent carotid artery ultrasound from 04/2023 showed 1 to 39% right ICA stenosis and improved 40 to 59% left ICA stenosis.  She  remains on aspirin and statin as outlined above.  Followed by vascular surgery.  DM2: A1c 7.4 in 11/2022, improved from 8.8.  Followed by PCP.  Tobacco use: Complete cessation is encouraged.     Disposition: F/u with Dr. [name] or an APP in 2 months.   Medication Adjustments/Labs and Tests Ordered: Current medicines are reviewed at length with the patient today.  Concerns regarding medicines are outlined above. Medication changes, Labs and Tests ordered today are summarized above and listed in the Patient Instructions accessible in Encounters.   Signed, Bernardino Bring, PA-C 10/19/2023 4:53 PM     [facility] [place] [address] [place], [place] [address] [phone]

## 2023-10-19 ENCOUNTER — Encounter: Payer: Self-pay | Admitting: Physician Assistant

## 2023-10-19 ENCOUNTER — Ambulatory Visit: Attending: Physician Assistant | Admitting: Physician Assistant

## 2023-10-19 VITALS — BP 168/82 | HR 89 | Ht 60.0 in | Wt 106.4 lb

## 2023-10-19 DIAGNOSIS — I10 Essential (primary) hypertension: Secondary | ICD-10-CM | POA: Diagnosis not present

## 2023-10-19 DIAGNOSIS — I700 Atherosclerosis of aorta: Secondary | ICD-10-CM | POA: Diagnosis not present

## 2023-10-19 DIAGNOSIS — E785 Hyperlipidemia, unspecified: Secondary | ICD-10-CM

## 2023-10-19 DIAGNOSIS — Z720 Tobacco use: Secondary | ICD-10-CM

## 2023-10-19 DIAGNOSIS — I2510 Atherosclerotic heart disease of native coronary artery without angina pectoris: Secondary | ICD-10-CM | POA: Diagnosis not present

## 2023-10-19 DIAGNOSIS — I6523 Occlusion and stenosis of bilateral carotid arteries: Secondary | ICD-10-CM

## 2023-10-19 NOTE — Patient Instructions (Signed)
 Medication Instructions:  Your physician recommends that you continue on your current medications as directed. Please refer to the Current Medication list given to you today.   *If you need a refill on your cardiac medications before your next appointment, please call your pharmacy*  Lab Work: None ordered at this time  If you have labs (blood work) drawn today and your tests are completely normal, you will receive your results only by: MyChart Message (if you have MyChart) OR A paper copy in the mail If you have any lab test that is abnormal or we need to change your treatment, we will call you to review the results.  Testing/Procedures: Your physician has requested that you have an echocardiogram. Echocardiography is a painless test that uses sound waves to create images of your heart. It provides your doctor with information about the size and shape of your heart and how well your heart's chambers and valves are working.   You may receive an ultrasound enhancing agent through an IV if needed to better visualize your heart during the echo. This procedure takes approximately one hour.  There are no restrictions for this procedure.  This will take place at [address] [facility], [place] [address]  Please note: We ask at that you not bring children with you during ultrasound (echo/ vascular) testing. Due to room size and safety concerns, children are not allowed in the ultrasound rooms during exams. Our front office staff cannot provide observation of children in our lobby area while testing is being conducted. An adult accompanying a patient to their appointment will only be allowed in the ultrasound room at the discretion of the ultrasound technician under special circumstances. We apologize for any inconvenience.  Follow-Up: At [facility], you and your health needs are our priority.  As part of our continuing mission to provide you with exceptional heart  care, our providers are all part of one team.  This team includes your primary Cardiologist (physician) and Advanced Practice Providers or APPs (Physician Assistants and Nurse Practitioners) who all work together to provide you with the care you need, when you need it.  Your next appointment:   2 month(s)  Provider:   You may see Timothy [name], MD or Bernardino Bring, PA-C

## 2023-11-01 ENCOUNTER — Other Ambulatory Visit: Payer: Self-pay | Admitting: Family Medicine

## 2023-11-01 DIAGNOSIS — E119 Type 2 diabetes mellitus without complications: Secondary | ICD-10-CM

## 2023-11-01 DIAGNOSIS — R0600 Dyspnea, unspecified: Secondary | ICD-10-CM

## 2023-11-08 ENCOUNTER — Other Ambulatory Visit: Payer: Self-pay | Admitting: Family Medicine

## 2023-11-08 DIAGNOSIS — E119 Type 2 diabetes mellitus without complications: Secondary | ICD-10-CM

## 2023-11-08 DIAGNOSIS — R0600 Dyspnea, unspecified: Secondary | ICD-10-CM

## 2023-11-08 NOTE — Telephone Encounter (Unsigned)
 Copied from CRM #[id number]. Topic: Clinical - Medication Refill >> Nov 08, 2023  4:34 PM Lauren C wrote: Medication:  Tiotropium Bromide Monohydrate  (SPIRIVA  RESPIMAT) 2.5 MCG/ACT AERS  Semaglutide  (RYBELSUS ) 7 MG TABS   Has the patient contacted their pharmacy? Yes (Agent: If no, request that the patient contact the pharmacy for the refill. If patient does not wish to contact the pharmacy document the reason why and proceed with request.) (Agent: If yes, when and what did the pharmacy advise?)  Pt needs an appointment per pharmacy. Appointment scheduled for September.  This is the patient's preferred pharmacy:  [id number] - [place], [place] - [address] [place] [address] Phone: [phone] Fax: [phone]  Is this the correct pharmacy for this prescription? Yes If no, delete pharmacy and type the correct one.   Has the prescription been filled recently? Yes  Is the patient out of the medication? Yes  Has the patient been seen for an appointment in the last year OR does the patient have an upcoming appointment? Yes  Can we respond through MyChart? Yes  Agent: Please be advised that Rx refills may take up to 3 business days. We ask that you follow-up with your pharmacy.

## 2023-11-10 NOTE — Telephone Encounter (Signed)
 Requested medication (s) are due for refill today: yes  Requested medication (s) are on the active medication list: yes  Last refill:  12/16/23  Future visit scheduled: yes  Notes to clinic:  Medication not assigned to a protocol, review manually.      Requested Prescriptions  Pending Prescriptions Disp Refills   Tiotropium Bromide Monohydrate  (SPIRIVA  RESPIMAT) 2.5 MCG/ACT AERS 4 g 0     Pulmonology:  Anticholinergic Agents Failed - 11/10/2023 12:21 PM      Failed - Valid encounter within last 12 months    Recent Outpatient Visits   None     Future Appointments             In 1 month Dunn, Bernardino HERO, PA-C [facility]             Semaglutide  (RYBELSUS ) 7 MG TABS 30 tablet 5     Off-Protocol Failed - 11/10/2023 12:21 PM      Failed - Medication not assigned to a protocol, review manually.      Failed - Valid encounter within last 12 months    Recent Outpatient Visits   None     Future Appointments             In 1 month Dunn, Bernardino HERO, PA-C [facility]

## 2023-11-14 ENCOUNTER — Ambulatory Visit (INDEPENDENT_AMBULATORY_CARE_PROVIDER_SITE_OTHER): Payer: PRIVATE HEALTH INSURANCE | Admitting: Vascular Surgery

## 2023-11-14 ENCOUNTER — Ambulatory Visit (INDEPENDENT_AMBULATORY_CARE_PROVIDER_SITE_OTHER): Payer: PRIVATE HEALTH INSURANCE

## 2023-11-14 ENCOUNTER — Encounter (INDEPENDENT_AMBULATORY_CARE_PROVIDER_SITE_OTHER): Payer: Self-pay | Admitting: Vascular Surgery

## 2023-11-14 VITALS — BP 139/77 | HR 84 | Ht 60.0 in | Wt 105.0 lb

## 2023-11-14 DIAGNOSIS — I6522 Occlusion and stenosis of left carotid artery: Secondary | ICD-10-CM | POA: Diagnosis not present

## 2023-11-14 DIAGNOSIS — I10 Essential (primary) hypertension: Secondary | ICD-10-CM | POA: Diagnosis not present

## 2023-11-14 DIAGNOSIS — E119 Type 2 diabetes mellitus without complications: Secondary | ICD-10-CM

## 2023-11-14 DIAGNOSIS — I739 Peripheral vascular disease, unspecified: Secondary | ICD-10-CM | POA: Diagnosis not present

## 2023-11-14 DIAGNOSIS — J432 Centrilobular emphysema: Secondary | ICD-10-CM

## 2023-11-14 NOTE — Progress Notes (Signed)
 MRN : [id number]  Dana Weber is a 63 y.o. (February 04, 1961) female who presents with chief complaint of check carotid arteries.  History of Present Illness:   The patient is seen for evaluation of carotid stenosis. The carotid stenosis was identified after duplex ultrasound was obtained for a left carotid bruit.   The patient denies amaurosis fugax. There is no recent history of TIA symptoms or focal motor deficits. There is no prior documented CVA.   There is no history of migraine headaches. There is no history of seizures.   The patient is also followed for PAD.  The patient has had noted pain in her lower extremities.  At her last office visit she had ABIs which were normal.  She has since followed up with neurosurgery at the [facility] spine center where she is receiving further workup and evaluation.   There is a history of hyperlipidemia which is being treated with a statin.    Duplex ultrasound of the carotid arteries obtained today shows RICA 1-39% amd LICA 60-79%   Current Meds  Medication Sig   albuterol  (VENTOLIN  HFA) 108 (90 Base) MCG/ACT inhaler INHALE 2 PUFFS BY MOUTH EVERY 6 HOURS AS NEEDED FOR WHEEZING OR SHORTNESS OF BREATH   aspirin EC 81 MG tablet Take 81 mg by mouth daily. Swallow whole.   benazepril  (LOTENSIN ) 5 MG tablet TAKE 1 TABLET (5 MG TOTAL) BY MOUTH DAILY.   dapagliflozin  propanediol (FARXIGA ) 10 MG TABS tablet Take 1 tablet (10 mg total) by mouth daily before breakfast.   ezetimibe  (ZETIA ) 10 MG tablet TAKE 1 TABLET(10 MG) BY MOUTH DAILY   glipiZIDE  (GLUCOTROL  XL) 5 MG 24 hr tablet TAKE 1 TABLET BY MOUTH EVERY DAY WITH BREAKFAST   lovastatin  (MEVACOR ) 20 MG tablet TAKE 1 TABLET BY MOUTH EVERY DAY   metFORMIN  (GLUCOPHAGE -XR) 500 MG 24 hr tablet TAKE 2 TABLETS BY MOUTH TWICE A DAY   PARoxetine  (PAXIL ) 40 MG tablet TAKE 1 TABLET BY MOUTH EVERY DAY   Semaglutide  (RYBELSUS ) 7 MG TABS TAKE 1 TABLET (7 MG TOTAL) BY MOUTH DAILY   Tiotropium  Bromide Monohydrate (SPIRIVA  RESPIMAT) 2.5 MCG/ACT AERS INHALE 2 PUFFS BY MOUTH INTO THE LUNGS DAILY    Past Medical History:  Diagnosis Date   Carotid artery stenosis    Diabetes mellitus without complication (HCC)    Fatty liver    Headache    sinus   Hyperlipidemia    Hypertension    Multilevel degenerative disc disease    spurs on spine also   Neuropathy    hands and feet   Vertigo    mild - daily - positional   Wears dentures    partial lower    Past Surgical History:  Procedure Laterality Date   ABDOMINAL HYSTERECTOMY     CESAREAN SECTION     COLONOSCOPY WITH PROPOFOL  N/A 03/20/2015   Procedure: COLONOSCOPY WITH PROPOFOL ;  Surgeon: Rogelia Copping, MD;  Location: [facility];  Service: Endoscopy;  Laterality: N/A;   COLONOSCOPY WITH PROPOFOL  N/A 11/14/2020   Procedure: COLONOSCOPY WITH PROPOFOL ;  Surgeon: Copping Rogelia, MD;  Location: [facility];  Service: Endoscopy;  Laterality: N/A;  Diabetic - oral meds   DILATION AND CURETTAGE OF UTERUS     ESOPHAGOGASTRODUODENOSCOPY (EGD) WITH PROPOFOL  N/A 03/20/2015   Procedure: ESOPHAGOGASTRODUODENOSCOPY (EGD) WITH PROPOFOL ;  Surgeon: Rogelia Copping, MD;  Location: [facility];  Service: Endoscopy;  Laterality: N/A;  Diabetic - oral meds   HEMORRHOID SURGERY     LAPAROSCOPIC CHOLECYSTECTOMY     LAPAROSCOPIC SUPRACERVICAL HYSTERECTOMY  2008   SALPINGOOPHORECTOMY Left 2008   TONSILLECTOMY AND ADENOIDECTOMY  1960    Social History Social History   Tobacco Use   Smoking status: Some Days    Current packs/day: 0.25    Average packs/day: 0.3 packs/day for 42.0 years (10.5 ttl pk-yrs)    Types: Cigarettes   Smokeless tobacco: Never   Tobacco comments:    has a cigarette every few days  Vaping Use   Vaping status: Former  Substance Use Topics   Alcohol use: Yes    Alcohol/week: 0.0 standard drinks of alcohol    Comment: OCCASIONALLY   Drug use: No    Family History Family History  Problem Relation  Age of Onset   Diabetes Mother    Hypertension Mother    Diabetes Father    Hypertension Father    Hypertension Brother    Hypertension Brother    Diabetes Paternal Grandmother    Emphysema Paternal Grandfather     Allergies  Allergen Reactions   Hydrocodone Swelling   Penicillins Swelling   Amoxicillin Swelling    Facial, and itching   Hydrocodone Bitartrate Er Swelling   Wellbutrin [Bupropion]     Makes her mean     REVIEW OF SYSTEMS (Negative unless checked)  Constitutional: [] Weight loss  [] Fever  [] Chills Cardiac: [] Chest pain   [] Chest pressure   [] Palpitations   [] Shortness of breath when laying flat   [] Shortness of breath with exertion. Vascular:  [x] Pain in legs with walking   [] Pain in legs at rest  [] History of DVT   [] Phlebitis   [] Swelling in legs   [] Varicose veins   [] Non-healing ulcers Pulmonary:   [] Uses home oxygen   [] Productive cough   [] Hemoptysis   [] Wheeze  [] COPD   [] Asthma Neurologic:  [] Dizziness   [] Seizures   [] History of stroke   [] History of TIA  [] Aphasia   [] Vissual changes   [] Weakness or numbness in arm   [] Weakness or numbness in leg Musculoskeletal:   [] Joint swelling   [] Joint pain   [] Low back pain Hematologic:  [] Easy bruising  [] Easy bleeding   [] Hypercoagulable state   [] Anemic Gastrointestinal:  [] Diarrhea   [] Vomiting  [] Gastroesophageal reflux/heartburn   [] Difficulty swallowing. Genitourinary:  [] Chronic kidney disease   [] Difficult urination  [] Frequent urination   [] Blood in urine Skin:  [] Rashes   [] Ulcers  Psychological:  [] History of anxiety   []  History of major depression.  Physical Examination  Vitals:   11/14/23 1528  BP: 139/77  Pulse: 84  Weight: 105 lb (47.6 kg)  Height: 5' (1.524 m)   Body mass index is 20.51 kg/m. Gen: WD/WN, NAD Head: [place]/AT, No temporalis wasting.  Ear/Nose/Throat: Hearing grossly intact, nares w/o erythema or drainage Eyes: PER, EOMI, sclera nonicteric.  Neck: Supple, no masses.  No  bruit or JVD.  Pulmonary:  Good air movement, no audible wheezing, no use of accessory muscles.  Cardiac: RRR, normal S1, S2, no Murmurs. Vascular:  carotid bruit noted Vessel Right Left  Radial Palpable Palpable  Carotid  Palpable  Palpable  Gastrointestinal: soft, non-distended. No guarding/no peritoneal signs.  Musculoskeletal: M/S 5/5 throughout.  No visible deformity.  Neurologic: CN 2-12 intact. Pain and light touch intact in extremities.  Symmetrical.  Speech is fluent. Motor exam as listed above. Psychiatric: Judgment  intact, Mood & affect appropriate for pt's clinical situation. Dermatologic: No rashes or ulcers noted.  No changes consistent with cellulitis.   CBC Lab Results  Component Value Date   WBC 7.4 12/17/2022   HGB 13.9 12/17/2022   HCT 42.1 12/17/2022   MCV 93 12/17/2022   PLT 329 12/17/2022    BMET    Component Value Date/Time   NA 140 12/17/2022 1625   K 4.1 12/17/2022 1625   CL 99 12/17/2022 1625   CO2 24 12/17/2022 1625   GLUCOSE 115 (H) 12/17/2022 1625   GLUCOSE 121 (H) 06/01/2022 1537   BUN 8 12/17/2022 1625   CREATININE 0.51 (L) 12/17/2022 1625   CALCIUM 9.6 12/17/2022 1625   GFRNONAA >60 06/01/2022 1537   GFRAA 106 05/18/2019 0828   CrCl cannot be calculated (Patient's most recent lab result is older than the maximum 21 days allowed.).  COAG No results found for: INR, PROTIME  Radiology No results found.   Assessment/Plan 1. Carotid artery stenosis, asymptomatic, left (Primary) Recommend:   Given the patient's asymptomatic subcritical stenosis no further invasive testing or surgery at this time.   Duplex ultrasound of the carotid arteries obtained today shows RICA 1-39% amd LICA 60-79%.   Continue antiplatelet therapy as prescribed Continue management of CAD, HTN and Hyperlipidemia Healthy heart diet,  encouraged exercise at least 4 times per week.   Follow up in 6 months with duplex ultrasound and physical exam  - VAS US   CAROTID; Future  2. PAD (peripheral artery disease) (HCC) Recommend:   I do not find evidence of life style limiting vascular disease. The patient specifically denies life style limitation.   Previous noninvasive studies including ABI's of the legs do not identify critical vascular problems.   The patient should continue walking and begin a more formal exercise program. The patient should continue his antiplatelet therapy and aggressive treatment of the lipid abnormalities.   The patient is instructed to call the office if there is a significant change in the lower extremity symptoms, particularly if a wound develops or there is an abrupt increase in leg pain.  3. Essential (primary) hypertension Continue antihypertensive medications as already ordered, these medications have been reviewed and there are no changes at this time.  4. Centrilobular emphysema (HCC) Continue pulmonary medications and aerosols as already ordered, these medications have been reviewed and there are no changes at this time.   5. Diabetes mellitus without complication (HCC) Continue hypoglycemic medications as already ordered, these medications have been reviewed and there are no changes at this time.  Hgb A1C to be monitored as already arranged by primary service d   Cordella Shawl, MD  11/14/2023 3:40 PM

## 2023-11-22 ENCOUNTER — Other Ambulatory Visit: Payer: Self-pay | Admitting: Family Medicine

## 2023-11-22 DIAGNOSIS — R0600 Dyspnea, unspecified: Secondary | ICD-10-CM

## 2023-11-27 ENCOUNTER — Encounter (INDEPENDENT_AMBULATORY_CARE_PROVIDER_SITE_OTHER): Payer: Self-pay | Admitting: Vascular Surgery

## 2023-11-30 ENCOUNTER — Other Ambulatory Visit: Payer: Self-pay | Admitting: Family Medicine

## 2023-11-30 DIAGNOSIS — R0600 Dyspnea, unspecified: Secondary | ICD-10-CM

## 2023-11-30 DIAGNOSIS — E119 Type 2 diabetes mellitus without complications: Secondary | ICD-10-CM

## 2023-12-01 MED ORDER — RYBELSUS 7 MG PO TABS: 7.0000 mg | ORAL_TABLET | Freq: Every day | ORAL | 0 refills | Status: DC

## 2023-12-01 NOTE — Telephone Encounter (Signed)
 Please clarify, is patient taking 3mg  or 7mg  rybelsus ?

## 2023-12-07 ENCOUNTER — Ambulatory Visit: Attending: Physician Assistant

## 2023-12-07 DIAGNOSIS — I2510 Atherosclerotic heart disease of native coronary artery without angina pectoris: Secondary | ICD-10-CM | POA: Diagnosis not present

## 2023-12-07 LAB — ECHOCARDIOGRAM COMPLETE
AR max vel: 1.52 cm2
AV Area VTI: 1.65 cm2
AV Area mean vel: 1.54 cm2
AV Mean grad: 7 mmHg
AV Peak grad: 13.1 mmHg
Ao pk vel: 1.81 m/s
Area-P 1/2: 4.68 cm2
S' Lateral: 2.48 cm

## 2023-12-08 ENCOUNTER — Ambulatory Visit: Payer: Self-pay | Admitting: Physician Assistant

## 2023-12-08 ENCOUNTER — Other Ambulatory Visit: Payer: Self-pay | Admitting: Medical Genetics

## 2023-12-12 ENCOUNTER — Other Ambulatory Visit: Payer: Self-pay | Admitting: Family Medicine

## 2023-12-12 DIAGNOSIS — J069 Acute upper respiratory infection, unspecified: Secondary | ICD-10-CM

## 2023-12-13 ENCOUNTER — Other Ambulatory Visit: Payer: Self-pay

## 2023-12-13 DIAGNOSIS — J069 Acute upper respiratory infection, unspecified: Secondary | ICD-10-CM

## 2023-12-13 NOTE — Telephone Encounter (Signed)
 LOV- 12/17/2022 NOV- 12/16/2023 LRF- 06/24/2023 8.5g x 5

## 2023-12-15 MED ORDER — ALBUTEROL SULFATE HFA 108 (90 BASE) MCG/ACT IN AERS: 2.0000 | INHALATION_SPRAY | Freq: Four times a day (QID) | RESPIRATORY_TRACT | 5 refills | Status: AC | PRN

## 2023-12-16 ENCOUNTER — Ambulatory Visit: Admitting: Family Medicine

## 2023-12-16 VITALS — BP 150/76 | HR 87 | Ht 60.0 in | Wt 105.7 lb

## 2023-12-16 DIAGNOSIS — E119 Type 2 diabetes mellitus without complications: Secondary | ICD-10-CM | POA: Diagnosis not present

## 2023-12-16 DIAGNOSIS — E785 Hyperlipidemia, unspecified: Secondary | ICD-10-CM | POA: Diagnosis not present

## 2023-12-16 DIAGNOSIS — E559 Vitamin D deficiency, unspecified: Secondary | ICD-10-CM

## 2023-12-16 DIAGNOSIS — I10 Essential (primary) hypertension: Secondary | ICD-10-CM

## 2023-12-16 DIAGNOSIS — K5904 Chronic idiopathic constipation: Secondary | ICD-10-CM

## 2023-12-16 DIAGNOSIS — Z7984 Long term (current) use of oral hypoglycemic drugs: Secondary | ICD-10-CM

## 2023-12-16 DIAGNOSIS — R112 Nausea with vomiting, unspecified: Secondary | ICD-10-CM | POA: Diagnosis not present

## 2023-12-16 MED ORDER — LINACLOTIDE 72 MCG PO CAPS: ORAL_CAPSULE | ORAL | 11 refills | Status: AC

## 2023-12-16 NOTE — Progress Notes (Signed)
 Established patient visit   Patient: Dana Weber   DOB: 1960/07/26   63 y.o. Female  MRN: [id number] Visit Date: 12/16/2023  Today's healthcare provider: Nancyann Perry, MD   Chief Complaint  Patient presents with   Medication Refill    Would like to see if pcp can take over rx for linzess  due to who was previously prescribing has retired.   Urinary Tract Infection    Patient reports a lot of lower back pain the past week. Urine specimen provided. She reports new onset urinary frequency and urinary hesitancy. The current episode started about a week ago and is gradually improving. Patient states symptoms are 5/10 in intensity, occurring intermittently. She  has not been recently treated for similar symptoms.    Associated symptoms:abdominal pain, low back pain, constipation, cramping,  nausea, vomiting    Subjective    Discussed the use of AI scribe software for clinical note transcription with the patient, who gave verbal consent to proceed.  History of Present Illness   Dana Weber is a 63 year old female with diabetes who presents for a checkup and medication review.  She has been without Rybelsus  for two months, leading to fluctuations in her blood sugar levels. She recently resumed taking Rybelsus  about a week ago and has since experienced episodes of vomiting, occurring three times this week, along with nausea today. Her blood sugar levels have been variable, ranging from 160-170 mg/dL, but sometimes dropping to 92 mg/dL after work. She has not checked her blood sugar in the past day or two.  She has been experiencing back pain for the last two weeks, primarily in the lower back and more towards the hips rather than the center. No symptoms of a bladder infection, although she initially felt something was off. A recent urinalysis was normal.  She has issues with constipation, for which she uses Linzess . Without it, she experiences significant difficulty, going  six to seven days without a bowel movement. She is currently out of Linzess  and requests a refill.  No significant breathing difficulties or shortness of breath, except for occasional wheezing when it is very humid.      Lab Results  Component Value Date   HGBA1C 7.4 (H) 12/17/2022   HGBA1C 8.8 (H) 03/26/2022   HGBA1C 7.6 (A) 11/24/2021   U/a 2+ glucose, trace protein, no LE, neg Nitrite.   Medications: Outpatient Medications Prior to Visit  Medication Sig   albuterol  (VENTOLIN  HFA) 108 (90 Base) MCG/ACT inhaler Inhale 2 puffs into the lungs every 6 (six) hours as needed for wheezing or shortness of breath.   aspirin 81 MG chewable tablet Chew 81 mg by mouth daily.   benazepril  (LOTENSIN ) 5 MG tablet TAKE 1 TABLET (5 MG TOTAL) BY MOUTH DAILY.   dapagliflozin  propanediol (FARXIGA ) 10 MG TABS tablet Take 1 tablet (10 mg total) by mouth daily before breakfast.   ezetimibe  (ZETIA ) 10 MG tablet TAKE 1 TABLET(10 MG) BY MOUTH DAILY   glipiZIDE  (GLUCOTROL  XL) 5 MG 24 hr tablet TAKE 1 TABLET BY MOUTH EVERY DAY WITH BREAKFAST   lovastatin  (MEVACOR ) 20 MG tablet TAKE 1 TABLET BY MOUTH EVERY DAY   metFORMIN  (GLUCOPHAGE -XR) 500 MG 24 hr tablet TAKE 2 TABLETS BY MOUTH TWICE A DAY   PARoxetine  (PAXIL ) 40 MG tablet TAKE 1 TABLET BY MOUTH EVERY DAY   Semaglutide  (RYBELSUS ) 7 MG TABS Take 1 tablet (7 mg total) by mouth daily.   Tiotropium Bromide Monohydrate  (SPIRIVA   RESPIMAT) 2.5 MCG/ACT AERS INHALE 2 PUFFS BY MOUTH INTO THE LUNGS DAILY   linaclotide  (LINZESS ) 72 MCG capsule 30 minutes before your first meal   No facility-administered medications prior to visit.   Review of Systems  Constitutional:  Negative for appetite change, chills, diaphoresis, fatigue and fever.  HENT:  Negative for congestion, ear discharge, ear pain, hearing loss, nosebleeds, sore throat and tinnitus.   Eyes:  Negative for photophobia, pain, discharge and redness.  Respiratory:  Negative for cough, chest tightness,  shortness of breath, wheezing and stridor.   Cardiovascular:  Negative for chest pain, palpitations and leg swelling.  Gastrointestinal:  Negative for abdominal pain, blood in stool, constipation, diarrhea, nausea and vomiting.  Endocrine: Negative for polydipsia.  Genitourinary:  Negative for dysuria, flank pain, frequency, hematuria and urgency.  Musculoskeletal:  Negative for back pain, myalgias and neck pain.  Skin:  Negative for rash.  Allergic/Immunologic: Negative for environmental allergies.  Neurological:  Negative for dizziness, tremors, seizures, weakness and headaches.  Hematological:  Does not bruise/bleed easily.  Psychiatric/Behavioral:  Negative for hallucinations and suicidal ideas. The patient is not nervous/anxious.        Objective    BP (!) 150/76 (BP Location: Left Arm, Patient Position: Sitting, Cuff Size: Normal)   Pulse 87   Ht 5' (1.524 m)   Wt 105 lb 11.2 oz (47.9 kg)   SpO2 98%   BMI 20.64 kg/m   Physical Exam   General appearance: Well developed, well nourished female, cooperative and in no acute distress Head: Normocephalic, without obvious abnormality, atraumatic Respiratory: Respirations even and unlabored, normal respiratory rate Extremities: All extremities are intact.  Skin: Skin color, texture, turgor normal. No rashes seen  Psych: Appropriate mood and affect. Neurologic: Mental status: Alert, oriented to person, place, and time, thought content appropriate.       Assessment & Plan    1. Diabetes mellitus without complication (HCC) (Primary) Had been out of Rybelsus  for a few months before resuming last week.  - Hemoglobin A1c - Urine Albumin/Creatinine with ratio (send out) [LAB689]  2. Nausea and vomiting, unspecified vomiting type Associated with Rybelsus , worse since increasing to 7. Consider alternative medicine or dose after reviewing A1c.   3. Essential (primary) hypertension She reports home blood pressures being much better  than office eadings.   4. Hyperlipidemia, unspecified hyperlipidemia type She is tolerating lovastatin  well with no adverse effects.   - CBC - Comprehensive metabolic panel with GFR - Lipid panel  5. Avitaminosis D  - VITAMIN D  25 Hydroxy (Vit-D Deficiency, Fractures)  6. Chronic idiopathic constipation Done well with Linzess  prescribed by her GI who is no longer practicing outpatient medicine.   - linaclotide  (LINZESS ) 72 MCG capsule; 30 minutes before your first meal  Dispense: 30 capsule; Refill: 11      Nancyann Perry, MD  [facility] [phone] (phone) [phone] (fax)  [facility]

## 2023-12-17 LAB — MICROALBUMIN / CREATININE URINE RATIO
Creatinine, Urine: 46.4 mg/dL
Microalb/Creat Ratio: 179 mg/g{creat} — ABNORMAL HIGH (ref 0–29)
Microalbumin, Urine: 83.2 ug/mL

## 2023-12-17 LAB — CBC
Hematocrit: 44.5 % (ref 34.0–46.6)
Hemoglobin: 15.3 g/dL (ref 11.1–15.9)
MCH: 31.8 pg (ref 26.6–33.0)
MCHC: 34.4 g/dL (ref 31.5–35.7)
MCV: 93 fL (ref 79–97)
Platelets: 330 x10E3/uL (ref 150–450)
RBC: 4.81 x10E6/uL (ref 3.77–5.28)
RDW: 12.4 % (ref 11.7–15.4)
WBC: 7.5 x10E3/uL (ref 3.4–10.8)

## 2023-12-17 LAB — COMPREHENSIVE METABOLIC PANEL WITH GFR
ALT: 20 IU/L (ref 0–32)
AST: 17 IU/L (ref 0–40)
Albumin: 4.4 g/dL (ref 3.9–4.9)
Alkaline Phosphatase: 74 IU/L (ref 49–135)
BUN/Creatinine Ratio: 20 (ref 12–28)
BUN: 13 mg/dL (ref 8–27)
Bilirubin Total: 0.2 mg/dL (ref 0.0–1.2)
CO2: 21 mmol/L (ref 20–29)
Calcium: 9.8 mg/dL (ref 8.7–10.3)
Chloride: 98 mmol/L (ref 96–106)
Creatinine, Ser: 0.64 mg/dL (ref 0.57–1.00)
Globulin, Total: 1.9 g/dL (ref 1.5–4.5)
Glucose: 170 mg/dL — ABNORMAL HIGH (ref 70–99)
Potassium: 4.2 mmol/L (ref 3.5–5.2)
Sodium: 139 mmol/L (ref 134–144)
Total Protein: 6.3 g/dL (ref 6.0–8.5)
eGFR: 100 mL/min/1.73

## 2023-12-17 LAB — LIPID PANEL
Chol/HDL Ratio: 2.8 ratio (ref 0.0–4.4)
Cholesterol, Total: 110 mg/dL (ref 100–199)
HDL: 39 mg/dL — ABNORMAL LOW (ref >39)
LDL Chol Calc (NIH): 51 mg/dL (ref 0–99)
Triglycerides: 108 mg/dL (ref 0–149)
VLDL Cholesterol Cal: 20 mg/dL (ref 5–40)

## 2023-12-17 LAB — HEMOGLOBIN A1C
Est. average glucose Bld gHb Est-mCnc: 209 mg/dL
Hgb A1c MFr Bld: 8.9 % — ABNORMAL HIGH (ref 4.8–5.6)

## 2023-12-17 LAB — VITAMIN D 25 HYDROXY (VIT D DEFICIENCY, FRACTURES): Vit D, 25-Hydroxy: 46.2 ng/mL (ref 30.0–100.0)

## 2023-12-18 ENCOUNTER — Ambulatory Visit: Payer: Self-pay | Admitting: Family Medicine

## 2023-12-21 NOTE — Progress Notes (Unsigned)
 Cardiology Office Note    Date:  12/23/2023   ID:  [name], Nowaczyk October 30, 1960, MRN [id number]  PCP:  Dana Nancyann BRAVO, MD  Cardiologist:  Evalene Lunger, MD  Electrophysiologist:  None   Chief Complaint: Follow-up  History of Present Illness:   Dana Weber is a 63 y.o. female with history of nonobstructive CAD by coronary CTA in 05/2022, aortic atherosclerosis, DM2, HTN, HLD, carotid artery stenosis followed by vascular surgery, and tobacco use who presents for follow-up of echo.  She was initially seen by Dr. [name] in 04/2022 for evaluation of chest pain and aortic atherosclerosis.  At that time she reported nagging chest pain.  She reported being on numerous medications for diabetes, though had struggled to improve A1c.  ABI normal bilaterally in 04/2022.  CT of the chest from 04/2022 was reviewed that showed coronary artery calcification and moderate aortic atherosclerosis.  Subsequent coronary CTA in 05/2022 showed a calcium score of 66.1 which was the 84th percentile.  There was 25% stenosis involving the proximal LAD.  Most recent carotid artery ultrasound from 04/2023 showed 1 to 39% right ICA stenosis with improved 40 to 59% left ICA stenosis (previously 60 to 79% stenosed in 10/2022).  She was last seen in the office in 09/2023 and reported intermittent randomly occurring sensation of an ache in the epigastric region (prior abdominal ultrasound in 12/2022 without evidence of aortic aneurysm).  Echo in 11/2023 showed an EF of 60 to 65%, no regional wall motion abnormalities, grade 1 diastolic dysfunction, normal RV systolic function and ventricular cavity size, aortic valve sclerosis without evidence of stenosis, and an estimated right atrial pressure of 3 mmHg.  She comes in today and is without symptoms of angina or cardiac decompensation.  She does note a 2-week history of intermittent palpitations that can last anywhere from several minutes to all day long.  At times  palpitations are associated with dizziness, at other times dizziness occurs without palpitations.  Dizziness is typically associated with positional change, though has occurred without positional change occasionally.  No falls or symptoms concerning for bleeding.  No presyncope or syncope.   Labs independently reviewed: 11/2023 - A1c 8.9, TC 110, TG 108, HDL 39, LDL 51, BUN 13, serum creatinine 0.64, potassium 4.2, albumin 4.4, AST/ALT normal, Hgb 15.3, PLT 330 03/2022 - TSH normal  Past Medical History:  Diagnosis Date   Carotid artery stenosis    Diabetes mellitus without complication (HCC)    Fatty liver    Headache    sinus   Hyperlipidemia    Hypertension    Multilevel degenerative disc disease    spurs on spine also   Neuropathy    hands and feet   Vertigo    mild - daily - positional   Wears dentures    partial lower    Past Surgical History:  Procedure Laterality Date   ABDOMINAL HYSTERECTOMY     CESAREAN SECTION     COLONOSCOPY WITH PROPOFOL  N/A 03/20/2015   Procedure: COLONOSCOPY WITH PROPOFOL ;  Surgeon: Weber Copping, MD;  Location: [facility];  Service: Endoscopy;  Laterality: N/A;   COLONOSCOPY WITH PROPOFOL  N/A 11/14/2020   Procedure: COLONOSCOPY WITH PROPOFOL ;  Surgeon: Dana Rogelia, MD;  Location: [facility];  Service: Endoscopy;  Laterality: N/A;  Diabetic - oral meds   DILATION AND CURETTAGE OF UTERUS     ESOPHAGOGASTRODUODENOSCOPY (EGD) WITH PROPOFOL  N/A 03/20/2015   Procedure: ESOPHAGOGASTRODUODENOSCOPY (EGD) WITH PROPOFOL ;  Surgeon: Weber Copping, MD;  Location: [facility];  Service: Endoscopy;  Laterality: N/A;  Diabetic - oral meds   HEMORRHOID SURGERY     LAPAROSCOPIC CHOLECYSTECTOMY     LAPAROSCOPIC SUPRACERVICAL HYSTERECTOMY  2008   SALPINGOOPHORECTOMY Left 2008   TONSILLECTOMY AND ADENOIDECTOMY  1960    Current Medications: Current Meds  Medication Sig   albuterol  (VENTOLIN  HFA) 108 (90 Base) MCG/ACT inhaler Inhale 2  puffs into the lungs every 6 (six) hours as needed for wheezing or shortness of breath.   aspirin 81 MG chewable tablet Chew 81 mg by mouth daily.   benazepril  (LOTENSIN ) 5 MG tablet TAKE 1 TABLET (5 MG TOTAL) BY MOUTH DAILY.   dapagliflozin  propanediol (FARXIGA ) 10 MG TABS tablet Take 1 tablet (10 mg total) by mouth daily before breakfast.   ezetimibe  (ZETIA ) 10 MG tablet TAKE 1 TABLET(10 MG) BY MOUTH DAILY   glipiZIDE  (GLUCOTROL  XL) 5 MG 24 hr tablet TAKE 1 TABLET BY MOUTH EVERY DAY WITH BREAKFAST   linaclotide  (LINZESS ) 72 MCG capsule 30 minutes before your first meal   lovastatin  (MEVACOR ) 20 MG tablet TAKE 1 TABLET BY MOUTH EVERY DAY   metFORMIN  (GLUCOPHAGE -XR) 500 MG 24 hr tablet TAKE 2 TABLETS BY MOUTH TWICE A DAY   PARoxetine  (PAXIL ) 40 MG tablet TAKE 1 TABLET BY MOUTH EVERY DAY   Semaglutide  (RYBELSUS ) 7 MG TABS Take 1 tablet (7 mg total) by mouth daily.   Tiotropium Bromide Monohydrate  (SPIRIVA  RESPIMAT) 2.5 MCG/ACT AERS INHALE 2 PUFFS BY MOUTH INTO THE LUNGS DAILY    Allergies:   Hydrocodone, Penicillins, Amoxicillin, Hydrocodone bitartrate er, and Wellbutrin [bupropion]   Social History   Socioeconomic History   Marital status: Married    Spouse name: Not on file   Number of children: Not on file   Years of education: Not on file   Highest education level: Associate degree: occupational, Scientist, product/process development, or vocational program  Occupational History   Not on file  Tobacco Use   Smoking status: Some Days    Current packs/day: 0.25    Average packs/day: 0.3 packs/day for 42.0 years (10.5 ttl pk-yrs)    Types: Cigarettes   Smokeless tobacco: Never   Tobacco comments:    has a cigarette every few days  Vaping Use   Vaping status: Former  Substance and Sexual Activity   Alcohol use: Yes    Alcohol/week: 0.0 standard drinks of alcohol    Comment: OCCASIONALLY   Drug use: No   Sexual activity: Yes    Birth control/protection: Surgical    Comment: hysterectomy   Other Topics  Concern   Not on file  Social History Narrative   Not on file   Social Drivers of Health   Financial Resource Strain: Low Risk  (12/16/2023)   Overall Financial Resource Strain (CARDIA)    Difficulty of Paying Living Expenses: Not hard at all  Food Insecurity: No Food Insecurity (12/16/2023)   Hunger Vital Sign    Worried About Running Out of Food in the Last Year: Never true    Ran Out of Food in the Last Year: Never true  Transportation Needs: No Transportation Needs (12/16/2023)   PRAPARE - Administrator, Civil Service (Medical): No    Lack of Transportation (Non-Medical): No  Physical Activity: Inactive (12/16/2023)   Exercise Vital Sign    Days of Exercise per Week: 0 days    Minutes of Exercise per Session: Not on file  Stress: Stress Concern Present (12/16/2023)   [employer] of  Occupational Health - Occupational Stress Questionnaire    Feeling of Stress: Rather much  Social Connections: Moderately Integrated (12/16/2023)   Social Connection and Isolation Panel    Frequency of Communication with Friends and Family: More than three times a week    Frequency of Social Gatherings with Friends and Family: Once a week    Attends Religious Services: More than 4 times per year    Active Member of [employer] or Organizations: No    Attends Engineer, structural: Not on file    Marital Status: Married     Family History:  The patient's family history includes Diabetes in her father, mother, and paternal grandmother; Emphysema in her paternal grandfather; Hypertension in her brother, brother, father, and mother.  ROS:   12-point review of systems is negative unless otherwise noted in the HPI.   EKGs/Labs/Other Studies Reviewed:    Studies reviewed were summarized above. The additional studies were reviewed today:  Coronary CTA 06/14/2022: FINDINGS: Aorta: Normal size. Mild ascending and descending aortic wall calcifications. No dissection.   Aortic Valve:   Trileaflet.  Mild calcifications.   Coronary Arteries:  Normal coronary origin.  Right dominance.   RCA is a dominant artery. There is no plaque.   Left main gives rise to LAD and LCX arteries. LM has no disease.   LAD has calcified plaque proximally causing mild stenosis (25%).   LCX is a non-dominant artery.  There is no plaque.   Other findings:   Normal pulmonary vein drainage into the left atrium.   Normal left atrial appendage without a thrombus.   Normal size of the pulmonary artery.   IMPRESSION: 1. Coronary calcium score of 66.1. This was 84th percentile for age and sex matched control. 2. Normal coronary origin with right dominance. 3. Calcified plaque in the proximal LAD causing mild stenosis (25%) 4. CAD-RADS 2. Mild non-obstructive CAD (25-49%). Consider non-atherosclerotic causes of chest pain. Consider preventive therapy and risk factor modification. __________   Carotid artery ultrasound 11/14/2023: Summary:  Right Carotid: Velocities in the right ICA are consistent with a 1-39%  stenosis.   Left Carotid: Velocities and Ratio suggest 60-79% on the Low End of the  scale in the Left ICA.   Vertebrals:  Bilateral vertebral arteries demonstrate antegrade flow.  Subclavians: Normal flow hemodynamics were seen in bilateral subclavian arteries.  __________  2D echo 12/07/2023: 1. Left ventricular ejection fraction, by estimation, is 60 to 65%. The  left ventricle has normal function. The left ventricle has no regional  wall motion abnormalities. Left ventricular diastolic parameters are  consistent with Grade I diastolic  dysfunction (impaired relaxation). The average left ventricular global  longitudinal strain is -19.9 %. The global longitudinal strain is normal.   2. Right ventricular systolic function is normal. The right ventricular  size is normal.   3. The mitral valve is normal in structure. No evidence of mitral valve  regurgitation.   4. The  aortic valve is calcified. Aortic valve regurgitation is not  visualized. Aortic valve sclerosis is present, with no evidence of aortic  valve stenosis.   5. The inferior vena cava is normal in size with greater than 50%  respiratory variability, suggesting right atrial pressure of 3 mmHg.     EKG:  EKG is not ordered today.    Recent Labs: 12/16/2023: ALT 20; BUN 13; Creatinine, Ser 0.64; Hemoglobin 15.3; Platelets 330; Potassium 4.2; Sodium 139  Recent Lipid Panel    Component Value Date/Time  CHOL 110 12/16/2023 1638   TRIG 108 12/16/2023 1638   HDL 39 (L) 12/16/2023 1638   CHOLHDL 2.8 12/16/2023 1638   LDLCALC 51 12/16/2023 1638    PHYSICAL EXAM:    VS:  BP (!) 148/86 (BP Location: Left Arm, Patient Position: Sitting, Cuff Size: Normal)   Pulse 97   Ht 5' (1.524 m)   Wt 103 lb 12.8 oz (47.1 kg)   SpO2 92%   BMI 20.27 kg/m   BMI: Body mass index is 20.27 kg/m.  Physical Exam Vitals reviewed.  Constitutional:      Appearance: She is well-developed.  HENT:     Head: Normocephalic and atraumatic.  Eyes:     General:        Right eye: No discharge.        Left eye: No discharge.  Cardiovascular:     Rate and Rhythm: Normal rate and regular rhythm.     Pulses:          Carotid pulses are  on the left side with bruit.    Heart sounds: Normal heart sounds, S1 normal and S2 normal. Heart sounds not distant. No midsystolic click and no opening snap. No murmur heard.    No friction rub.  Pulmonary:     Effort: Pulmonary effort is normal. No respiratory distress.     Breath sounds: Normal breath sounds. No decreased breath sounds, wheezing, rhonchi or rales.  Musculoskeletal:     Cervical back: Normal range of motion.     Right lower leg: No edema.     Left lower leg: No edema.  Skin:    General: Skin is warm and dry.     Nails: There is no clubbing.  Neurological:     Mental Status: She is alert and oriented to person, place, and time.  Psychiatric:         Speech: Speech normal.        Behavior: Behavior normal.        Thought Content: Thought content normal.        Judgment: Judgment normal.     Wt Readings from Last 3 Encounters:  12/23/23 103 lb 12.8 oz (47.1 kg)  12/16/23 105 lb 11.2 oz (47.9 kg)  11/14/23 105 lb (47.6 kg)     ASSESSMENT & PLAN:   Palpitations: Place Zio patch.  Recent coronary CTA without evidence of obstructive CAD.  Echo without significant structural abnormality.  Potassium at goal.  Thyroid normal.  Nonobstructive CAD: No symptoms suggestive of angina or cardiac decompensation.  Coronary CTA in 05/2022 showed 25% LAD stenosis. Continue aggressive risk factor modification and primary prevention including aspirin 81 mg and lovastatin  20 mg.  Echo without significant structural abnormalities.  Aortic atherosclerosis/HLD: LDL 51 in 11/2023.  She remains on lovastatin  20 mg.  HTN: Blood pressure is mildly elevated in the office today.  For now, she remains on benazepril  5 mg daily.  Anticipate escalation of pharmacotherapy following Zio patch.  Carotid artery stenosis: Most recent carotid artery ultrasound from 04/2023 showed 1 to 39% right ICA stenosis and improved 40 to 59% left ICA stenosis.  She remains on aspirin and statin as outlined above.  Followed by vascular surgery.   Tobacco use: Complete cessation is encouraged.     Disposition: F/u with Dr. [name] or an APP in 6 weeks.   Medication Adjustments/Labs and Tests Ordered: Current medicines are reviewed at length with the patient today.  Concerns regarding medicines are outlined above. Medication  changes, Labs and Tests ordered today are summarized above and listed in the Patient Instructions accessible in Encounters.   Signed, Bernardino Bring, PA-C 12/23/2023 5:24 PM     [facility] [place] [address] [place], [place] [address] [phone]

## 2023-12-22 ENCOUNTER — Other Ambulatory Visit
Admission: RE | Admit: 2023-12-22 | Discharge: 2023-12-22 | Disposition: A | Discharge status: Home / Self Care | Payer: Self-pay | Source: Ambulatory Visit | Attending: Medical Genetics | Admitting: Medical Genetics

## 2023-12-23 ENCOUNTER — Ambulatory Visit

## 2023-12-23 ENCOUNTER — Ambulatory Visit: Attending: Physician Assistant | Admitting: Physician Assistant

## 2023-12-23 VITALS — BP 148/86 | HR 97 | Ht 60.0 in | Wt 103.8 lb

## 2023-12-23 DIAGNOSIS — I6523 Occlusion and stenosis of bilateral carotid arteries: Secondary | ICD-10-CM

## 2023-12-23 DIAGNOSIS — E785 Hyperlipidemia, unspecified: Secondary | ICD-10-CM

## 2023-12-23 DIAGNOSIS — Z720 Tobacco use: Secondary | ICD-10-CM

## 2023-12-23 DIAGNOSIS — I10 Essential (primary) hypertension: Secondary | ICD-10-CM | POA: Diagnosis not present

## 2023-12-23 DIAGNOSIS — R002 Palpitations: Secondary | ICD-10-CM

## 2023-12-23 DIAGNOSIS — I2510 Atherosclerotic heart disease of native coronary artery without angina pectoris: Secondary | ICD-10-CM | POA: Diagnosis not present

## 2023-12-23 DIAGNOSIS — I700 Atherosclerosis of aorta: Secondary | ICD-10-CM

## 2023-12-23 NOTE — Patient Instructions (Signed)
 Medication Instructions:  Your physician recommends that you continue on your current medications as directed. Please refer to the Current Medication list given to you today.   *If you need a refill on your cardiac medications before your next appointment, please call your pharmacy*  Lab Work: None ordered at this time  If you have labs (blood work) drawn today and your tests are completely normal, you will receive your results only by: MyChart Message (if you have MyChart) OR A paper copy in the mail If you have any lab test that is abnormal or we need to change your treatment, we will call you to review the results.  Testing/Procedures: Dana Weber- Long Term Monitor Instructions  Your physician has requested you wear a ZIO patch monitor for 14 days.  This is a single patch monitor. [employer] supplies one patch monitor per enrollment. Additional stickers are not available. Please do not apply patch if you will be having a Nuclear Stress Test, Echocardiogram, Cardiac CT, MRI, or Chest Xray during the period you would be wearing the monitor. The patch cannot be worn during these tests. You cannot remove and re-apply the ZIO XT patch monitor.  Your ZIO patch monitor will be mailed 3 day USPS to your address on file. It may take 3-5 days to receive your monitor after you have been enrolled. Once you have received your monitor, please review the enclosed instructions. Your monitor has already been registered assigning a specific monitor serial number to you.  Billing and Patient Assistance Program Information  We have supplied [employer] with any of your insurance information on file for billing purposes.  [employer] offers a sliding scale Patient Assistance Program for patients that do not have insurance, or whose insurance does not completely cover the cost of the ZIO monitor.  You must apply for the Patient Assistance Program to qualify for this discounted rate.  To apply, please call [employer] at [phone],  select option 4, select option 2, ask to apply for Patient Assistance Program. Meredeth will ask your household income, and how many people are in your household. They will quote your out-of-pocket cost based on that information. [employer] will also be able to set up a 58-month, interest-free payment plan if needed.  Applying the monitor   Shave hair from upper left chest.  Hold abrader disc by orange tab. Rub abrader in 40 strokes over the upper left chest as indicated in your monitor instructions.  Clean area with 4 enclosed alcohol pads. Let dry.  Apply patch as indicated in monitor instructions. Patch will be placed under collarbone on left side of chest with arrow pointing upward.  Rub patch adhesive wings for 2 minutes. Remove white label marked 1. Remove the white label marked 2. Rub patch adhesive wings for 2 additional minutes.  While looking in a mirror, press and release button in center of patch. A small green light will flash 3-4 times. This will be your only indicator that the monitor has been turned on.  Do not shower for the first 24 hours. You may shower after the first 24 hours.  Press the button if you feel a symptom. You will hear a small click. Record Date, Time and Symptom in the Patient Logbook.  When you are ready to remove the patch, follow instructions on the last 2 pages of Patient Logbook.  Stick patch monitor into the tabs at the bottom of the return box.  Place Patient Logbook in the blue and white box. Use locking  tab on box and tape box closed securely. The blue and white box has prepaid postage on it. Please place it in the mailbox as soon as possible. Your physician should have your test results approximately 7-14 days after the monitor has been mailed back to [facility].  Call [employer] Customer Care at [phone] if you have questions regarding your ZIO XT patch monitor.  Call them immediately if you see an orange light blinking on your monitor.  If  your monitor falls off in less than 4 days, contact our Monitor department at [phone].  If your monitor becomes loose or falls off after 4 days call [employer] at [phone] for suggestions on securing your monitor.   Follow-Up: At [facility], you and your health needs are our priority.  As part of our continuing mission to provide you with exceptional heart care, our providers are all part of one team.  This team includes your primary Cardiologist (physician) and Advanced Practice Providers or APPs (Physician Assistants and Nurse Practitioners) who all work together to provide you with the care you need, when you need it.  Your next appointment:   6 week(s)  Provider:   You may see Timothy [name], MD or Bernardino Bring, PA-C

## 2023-12-30 LAB — GENECONNECT MOLECULAR SCREEN: Genetic Analysis Overall Interpretation: NEGATIVE

## 2024-01-20 ENCOUNTER — Other Ambulatory Visit: Payer: Self-pay | Admitting: Family Medicine

## 2024-01-20 DIAGNOSIS — E119 Type 2 diabetes mellitus without complications: Secondary | ICD-10-CM

## 2024-01-21 ENCOUNTER — Other Ambulatory Visit: Payer: Self-pay | Admitting: Cardiovascular Disease

## 2024-01-22 DIAGNOSIS — R002 Palpitations: Secondary | ICD-10-CM

## 2024-01-22 NOTE — Telephone Encounter (Signed)
 Patient was on 7mg  rybelsus , but was having nausea with it. Please check and see if she wants to go down to the 3mg , or stay on the 7mg .

## 2024-01-24 ENCOUNTER — Other Ambulatory Visit: Payer: Self-pay

## 2024-01-24 ENCOUNTER — Ambulatory Visit: Payer: Self-pay | Admitting: Physician Assistant

## 2024-01-24 DIAGNOSIS — E119 Type 2 diabetes mellitus without complications: Secondary | ICD-10-CM

## 2024-01-24 DIAGNOSIS — E785 Hyperlipidemia, unspecified: Secondary | ICD-10-CM

## 2024-01-24 MED ORDER — EZETIMIBE 10 MG PO TABS
10.0000 mg | ORAL_TABLET | Freq: Every day | ORAL | 4 refills | Status: DC
Start: 2024-01-24 — End: 2024-02-07

## 2024-02-05 NOTE — Progress Notes (Unsigned)
 Cardiology Office Note    Date:  02/07/2024   ID:  Norlene, Lanes 08/24/1960, MRN [id number]  PCP:  Gasper Nancyann BRAVO, MD  Cardiologist:  Evalene Lunger, MD  Electrophysiologist:  None   Chief Complaint: Follow up  History of Present Illness:   Dana Weber is a 63 y.o. female with history of nonobstructive CAD by coronary CTA in 05/2022, palpitations with occasional PVCs, aortic atherosclerosis, DM2, HTN, HLD, carotid artery stenosis followed by vascular surgery, and tobacco use who presents for follow-up of Zio patch.  She was initially seen by Dr. [name] in 04/2022 for evaluation of chest pain and aortic atherosclerosis.  At that time she reported nagging chest pain.  She reported being on numerous medications for diabetes, though had struggled to improve A1c.  ABI normal bilaterally in 04/2022.  CT of the chest from 04/2022 was reviewed that showed coronary artery calcification and moderate aortic atherosclerosis.  Subsequent coronary CTA in 05/2022 showed a calcium score of 66.1 which was the 84th percentile.  There was 25% stenosis involving the proximal LAD.  Most recent carotid artery ultrasound from 04/2023 showed 1 to 39% right ICA stenosis with improved 40 to 59% left ICA stenosis (previously 60 to 79% stenosed in 10/2022).  She was seen in the office in 09/2023 and reported intermittent randomly occurring sensation of an ache in the epigastric region (prior abdominal ultrasound in 12/2022 without evidence of aortic aneurysm).  Echo in 11/2023 showed an EF of 60 to 65%, no regional wall motion abnormalities, grade 1 diastolic dysfunction, normal RV systolic function and ventricular cavity size, aortic valve sclerosis without evidence of stenosis, and an estimated right atrial pressure of 3 mmHg.  She was last seen in the office in 12/2023 noting a 2-week history of intermittent palpitations that were at times associated with dizziness, and other times not.  Subsequent Zio patch  in 12/2023 showed a predominant rhythm of sinus with an average rate of 89 bpm with a range of 63 to 152 bpm, and occasional PVCs with a burden of 3.3% as well as isolated PACs with a burden of less than 1%.  11 triggered events were associated with sinus rhythm and sinus with PVCs.  It was recommended she start Toprol -XL 25 mg nightly.  She preferred to discuss this at follow-up.  She comes in today and is without symptoms of angina or cardiac decompensation.  She continues to note daily palpitations that at times will last all day long and sometimes are associated with dizziness without near syncope or syncope.  No significant lower extremity swelling.  No falls or symptoms concerning for bleeding.   Labs independently reviewed: 11/2023 - A1c 8.9, TC 110, TG 108, HDL 39, LDL 51, BUN 13, serum creatinine 0.64, potassium 4.2, albumin 4.4, AST/ALT normal, Hgb 15.3, PLT 330 03/2022 - TSH normal  Past Medical History:  Diagnosis Date   Carotid artery stenosis    Diabetes mellitus without complication (HCC)    Fatty liver    Headache    sinus   Hyperlipidemia    Hypertension    Multilevel degenerative disc disease    spurs on spine also   Neuropathy    hands and feet   Vertigo    mild - daily - positional   Wears dentures    partial lower    Past Surgical History:  Procedure Laterality Date   ABDOMINAL HYSTERECTOMY     CESAREAN SECTION     COLONOSCOPY WITH PROPOFOL   N/A 03/20/2015   Procedure: COLONOSCOPY WITH PROPOFOL ;  Surgeon: Rogelia Copping, MD;  Location: [facility];  Service: Endoscopy;  Laterality: N/A;   COLONOSCOPY WITH PROPOFOL  N/A 11/14/2020   Procedure: COLONOSCOPY WITH PROPOFOL ;  Surgeon: Copping Rogelia, MD;  Location: [facility];  Service: Endoscopy;  Laterality: N/A;  Diabetic - oral meds   DILATION AND CURETTAGE OF UTERUS     ESOPHAGOGASTRODUODENOSCOPY (EGD) WITH PROPOFOL  N/A 03/20/2015   Procedure: ESOPHAGOGASTRODUODENOSCOPY (EGD) WITH PROPOFOL ;  Surgeon:  Rogelia Copping, MD;  Location: [facility];  Service: Endoscopy;  Laterality: N/A;  Diabetic - oral meds   HEMORRHOID SURGERY     LAPAROSCOPIC CHOLECYSTECTOMY     LAPAROSCOPIC SUPRACERVICAL HYSTERECTOMY  2008   SALPINGOOPHORECTOMY Left 2008   TONSILLECTOMY AND ADENOIDECTOMY  1960    Current Medications: Current Meds  Medication Sig   albuterol  (VENTOLIN  HFA) 108 (90 Base) MCG/ACT inhaler Inhale 2 puffs into the lungs every 6 (six) hours as needed for wheezing or shortness of breath.   aspirin 81 MG chewable tablet Chew 81 mg by mouth daily.   benazepril  (LOTENSIN ) 5 MG tablet TAKE 1 TABLET (5 MG TOTAL) BY MOUTH DAILY.   dapagliflozin  propanediol (FARXIGA ) 10 MG TABS tablet Take 1 tablet (10 mg total) by mouth daily before breakfast.   glipiZIDE  (GLUCOTROL  XL) 5 MG 24 hr tablet TAKE 1 TABLET BY MOUTH EVERY DAY WITH BREAKFAST   linaclotide  (LINZESS ) 72 MCG capsule 30 minutes before your first meal   lovastatin  (MEVACOR ) 20 MG tablet TAKE 1 TABLET BY MOUTH EVERY DAY   metFORMIN  (GLUCOPHAGE -XR) 500 MG 24 hr tablet TAKE 2 TABLETS BY MOUTH TWICE A DAY   metoprolol  succinate (TOPROL  XL) 25 MG 24 hr tablet Take 1 tablet (25 mg total) by mouth daily.   PARoxetine  (PAXIL ) 40 MG tablet TAKE 1 TABLET BY MOUTH EVERY DAY   Semaglutide  (RYBELSUS ) 7 MG TABS Take 1 tablet (7 mg total) by mouth daily.   Tiotropium Bromide Monohydrate  (SPIRIVA  RESPIMAT) 2.5 MCG/ACT AERS INHALE 2 PUFFS BY MOUTH INTO THE LUNGS DAILY   [DISCONTINUED] ezetimibe  (ZETIA ) 10 MG tablet Take 1 tablet (10 mg total) by mouth daily.    Allergies:   Hydrocodone, Penicillins, Amoxicillin, Hydrocodone bitartrate er, and Wellbutrin [bupropion]   Social History   Socioeconomic History   Marital status: Married    Spouse name: Not on file   Number of children: Not on file   Years of education: Not on file   Highest education level: Associate degree: occupational, scientist, product/process development, or vocational program  Occupational History   Not on  file  Tobacco Use   Smoking status: Some Days    Current packs/day: 0.25    Average packs/day: 0.3 packs/day for 42.0 years (10.5 ttl pk-yrs)    Types: Cigarettes   Smokeless tobacco: Never   Tobacco comments:    has a cigarette every few days  Vaping Use   Vaping status: Former  Substance and Sexual Activity   Alcohol use: Yes    Alcohol/week: 0.0 standard drinks of alcohol    Comment: OCCASIONALLY   Drug use: No   Sexual activity: Yes    Birth control/protection: Surgical    Comment: hysterectomy   Other Topics Concern   Not on file  Social History Narrative   Not on file   Social Drivers of Health   Financial Resource Strain: Low Risk  (12/16/2023)   Overall Financial Resource Strain (CARDIA)    Difficulty of Paying Living Expenses: Not hard at  all  Food Insecurity: No Food Insecurity (12/16/2023)   Hunger Vital Sign    Worried About Running Out of Food in the Last Year: Never true    Ran Out of Food in the Last Year: Never true  Transportation Needs: No Transportation Needs (12/16/2023)   PRAPARE - Administrator, Civil Service (Medical): No    Lack of Transportation (Non-Medical): No  Physical Activity: Inactive (12/16/2023)   Exercise Vital Sign    Days of Exercise per Week: 0 days    Minutes of Exercise per Session: Not on file  Stress: Stress Concern Present (12/16/2023)   [employer] of Occupational Health - Occupational Stress Questionnaire    Feeling of Stress: Rather much  Social Connections: Moderately Integrated (12/16/2023)   Social Connection and Isolation Panel    Frequency of Communication with Friends and Family: More than three times a week    Frequency of Social Gatherings with Friends and Family: Once a week    Attends Religious Services: More than 4 times per year    Active Member of [employer] or Organizations: No    Attends Engineer, Structural: Not on file    Marital Status: Married     Family History:  The patient's  family history includes Diabetes in her father, mother, and paternal grandmother; Emphysema in her paternal grandfather; Hypertension in her brother, brother, father, and mother.  ROS:   12-point review of systems is negative unless otherwise noted in the HPI.   EKGs/Labs/Other Studies Reviewed:    Studies reviewed were summarized above. The additional studies were reviewed today:  Coronary CTA 06/14/2022: FINDINGS: Aorta: Normal size. Mild ascending and descending aortic wall calcifications. No dissection.   Aortic Valve:  Trileaflet.  Mild calcifications.   Coronary Arteries:  Normal coronary origin.  Right dominance.   RCA is a dominant artery. There is no plaque.   Left main gives rise to LAD and LCX arteries. LM has no disease.   LAD has calcified plaque proximally causing mild stenosis (25%).   LCX is a non-dominant artery.  There is no plaque.   Other findings:   Normal pulmonary vein drainage into the left atrium.   Normal left atrial appendage without a thrombus.   Normal size of the pulmonary artery.   IMPRESSION: 1. Coronary calcium score of 66.1. This was 84th percentile for age and sex matched control. 2. Normal coronary origin with right dominance. 3. Calcified plaque in the proximal LAD causing mild stenosis (25%) 4. CAD-RADS 2. Mild non-obstructive CAD (25-49%). Consider non-atherosclerotic causes of chest pain. Consider preventive therapy and risk factor modification. __________   Carotid artery ultrasound 11/14/2023: Summary:  Right Carotid: Velocities in the right ICA are consistent with a 1-39%  stenosis.   Left Carotid: Velocities and Ratio suggest 60-79% on the Low End of the  scale in the Left ICA.   Vertebrals:  Bilateral vertebral arteries demonstrate antegrade flow.  Subclavians: Normal flow hemodynamics were seen in bilateral subclavian arteries.  __________   2D echo 12/07/2023: 1. Left ventricular ejection fraction, by estimation, is  60 to 65%. The  left ventricle has normal function. The left ventricle has no regional  wall motion abnormalities. Left ventricular diastolic parameters are  consistent with Grade I diastolic  dysfunction (impaired relaxation). The average left ventricular global  longitudinal strain is -19.9 %. The global longitudinal strain is normal.   2. Right ventricular systolic function is normal. The right ventricular  size is normal.  3. The mitral valve is normal in structure. No evidence of mitral valve  regurgitation.   4. The aortic valve is calcified. Aortic valve regurgitation is not  visualized. Aortic valve sclerosis is present, with no evidence of aortic  valve stenosis.   5. The inferior vena cava is normal in size with greater than 50%  respiratory variability, suggesting right atrial pressure of 3 mmHg.  __________  Zio patch 12/2023: Normal sinus rhythm Patient had a min HR of 63 bpm, max HR of 152 bpm, and avg HR of 89 bpm.    Isolated SVEs were rare (<1.0%), SVE Couplets were rare (<1.0%), and no SVE Triplets were present.    Isolated VEs were occasional (3.3%, 57321), VE Couplets were rare (<1.0%, 107), and no VE Triplets were present. Ventricular Bigeminy and Trigeminy were present.    Patient triggered events (11) associated with normal sinus rhythm, sinus with PVCs   EKG:  EKG is ordered today.  The EKG ordered today demonstrates NSR, 74 bpm, no acute ST-T changes  Recent Labs: 12/16/2023: ALT 20; BUN 13; Creatinine, Ser 0.64; Hemoglobin 15.3; Platelets 330; Potassium 4.2; Sodium 139  Recent Lipid Panel    Component Value Date/Time   CHOL 110 12/16/2023 1638   TRIG 108 12/16/2023 1638   HDL 39 (L) 12/16/2023 1638   CHOLHDL 2.8 12/16/2023 1638   LDLCALC 51 12/16/2023 1638    PHYSICAL EXAM:    VS:  BP (!) 150/80 (BP Location: Left Arm, Patient Position: Sitting, Cuff Size: Small)   Pulse 74 Comment: 80 oximeter  Ht 5' (1.524 m)   Wt 103 lb 6.4 oz (46.9 kg)    SpO2 97%   BMI 20.19 kg/m   BMI: Body mass index is 20.19 kg/m.  Physical Exam Vitals reviewed.  Constitutional:      Appearance: She is well-developed.  HENT:     Head: Normocephalic and atraumatic.  Eyes:     General:        Right eye: No discharge.        Left eye: No discharge.  Cardiovascular:     Rate and Rhythm: Normal rate and regular rhythm.     Pulses:          Carotid pulses are  on the left side with bruit.    Heart sounds: Normal heart sounds, S1 normal and S2 normal. Heart sounds not distant. No midsystolic click and no opening snap. No murmur heard.    No friction rub.     Comments: Rare ectopy. Pulmonary:     Effort: Pulmonary effort is normal. No respiratory distress.     Breath sounds: Normal breath sounds. No decreased breath sounds, wheezing, rhonchi or rales.  Musculoskeletal:     Cervical back: Normal range of motion.     Right lower leg: No edema.     Left lower leg: No edema.  Skin:    General: Skin is warm and dry.     Nails: There is no clubbing.  Neurological:     Mental Status: She is alert and oriented to person, place, and time.  Psychiatric:        Speech: Speech normal.        Behavior: Behavior normal.        Thought Content: Thought content normal.        Judgment: Judgment normal.     Wt Readings from Last 3 Encounters:  02/07/24 103 lb 6.4 oz (46.9 kg)  12/23/23 103 lb 12.8 oz (  47.1 kg)  12/16/23 105 lb 11.2 oz (47.9 kg)     ASSESSMENT & PLAN:   Palpitations with occasional PVCs: Zio patch showed occasional PVCs with an overall burden of 3.3%.  In reviewing patient triggered episodes, she is symptomatic with these.  Recent coronary CTA showed no evidence of obstructive disease.  Echo showed no cardiomyopathy.  Trial of Toprol -XL 25 mg taken nightly.  If she does not feel well on metoprolol , consider alternative beta-blocker or nondihydropyridine calcium channel blocker.  Potassium at goal.  Thyroid function  normal.  Nonobstructive CAD: No symptoms suggestive of angina.  Coronary CTA in 05/2022 showed 25% LAD stenosis.  Continue aggressive risk factor modification and primary prevention including aspirin 81 mg and lovastatin  20 mg.  Echo without significant structural abnormalities.  Aortic atherosclerosis/HLD: LDL 51 in 11/2023.  Currently on lovastatin  20 mg and ezetimibe  10 mg.  We will undergo a trial of holding ezetimibe  with follow-up fasting lipid panel 2 months thereafter.  If LDL is trended to above 70, anticipate reinitiation of ezetimibe .  HTN: Blood pressure is mildly elevated in the office today.  She remains on benazepril  5 mg.  Started Toprol -XL 25 mg as outlined above.  Carotid artery stenosis: Most recent carotid artery ultrasound from 04/2023 showed 1 to 39% right ICA stenosis and improved 40 to 59% left ICA stenosis.  She remains on aspirin and statin therapy as outlined above.  Followed by vascular surgery.    Disposition: F/u with Dr. [name] or an APP in 2 months.   Medication Adjustments/Labs and Tests Ordered: Current medicines are reviewed at length with the patient today.  Concerns regarding medicines are outlined above. Medication changes, Labs and Tests ordered today are summarized above and listed in the Patient Instructions accessible in Encounters.   Signed, Bernardino Bring, PA-C 02/07/2024 5:41 PM     [facility] [place] [address] [place], [place] [address] [phone]

## 2024-02-07 ENCOUNTER — Ambulatory Visit: Attending: Physician Assistant | Admitting: Physician Assistant

## 2024-02-07 ENCOUNTER — Encounter: Payer: Self-pay | Admitting: Physician Assistant

## 2024-02-07 VITALS — BP 150/80 | HR 74 | Ht 60.0 in | Wt 103.4 lb

## 2024-02-07 DIAGNOSIS — Z720 Tobacco use: Secondary | ICD-10-CM

## 2024-02-07 DIAGNOSIS — I493 Ventricular premature depolarization: Secondary | ICD-10-CM | POA: Diagnosis not present

## 2024-02-07 DIAGNOSIS — E785 Hyperlipidemia, unspecified: Secondary | ICD-10-CM

## 2024-02-07 DIAGNOSIS — I700 Atherosclerosis of aorta: Secondary | ICD-10-CM | POA: Diagnosis not present

## 2024-02-07 DIAGNOSIS — I6523 Occlusion and stenosis of bilateral carotid arteries: Secondary | ICD-10-CM

## 2024-02-07 DIAGNOSIS — Z79899 Other long term (current) drug therapy: Secondary | ICD-10-CM

## 2024-02-07 DIAGNOSIS — I10 Essential (primary) hypertension: Secondary | ICD-10-CM

## 2024-02-07 DIAGNOSIS — I2510 Atherosclerotic heart disease of native coronary artery without angina pectoris: Secondary | ICD-10-CM

## 2024-02-07 MED ORDER — METOPROLOL SUCCINATE ER 25 MG PO TB24: 25.0000 mg | ORAL_TABLET | Freq: Every day | ORAL | 3 refills | Status: AC

## 2024-02-07 NOTE — Patient Instructions (Signed)
 Medication Instructions:  Your physician recommends the following medication changes.  STOP TAKING: Zetia  (we will trial off for 2 months and check your cholesterol)  START TAKING: Toprol -XL (metoprolol  succinate) 25 mg nightly  *If you need a refill on your cardiac medications before your next appointment, please call your pharmacy*  Lab Work: Your provider would like for you to return in 2 months (one day before your follow-up appointment with Bernardino) to have the following labs drawn: Lipid Panel.   Please go to [facility] [address] ([facility] [address], [place] [address] You do not need an appointment.  They are open from 8 am- 4:30 pm.  Lunch from 1:00 pm- 2:00 pm You DO need to be fasting.   You may also go to one of the following [facility]:  [address] [place], [place] [address] Phone: [phone] Lab hours: Mon-Fri 8 am- 5 pm    Lunch 12 pm- 1 pm  [address] [place],  [place]  [address]  [place]  Phone: [phone] Lab hours: 7 am- 4 pm Lunch 12 pm-1 pm   [address] [place],  [place]  [address]  [place]  Phone: [phone] Lab hours: Mon-Fri 8 am- 5 pm    Lunch 12 pm- 1 pm  If you have labs (blood work) drawn today and your tests are completely normal, you will receive your results only by: MyChart Message (if you have MyChart) OR A paper copy in the mail If you have any lab test that is abnormal or we need to change your treatment, we will call you to review the results.  Follow-Up: At [facility], you and your health needs are our priority.  As part of our continuing mission to provide you with exceptional heart care, our providers are all part of one team.  This team includes your primary Cardiologist (physician) and Advanced Practice Providers or APPs (Physician Assistants and Nurse Practitioners) who all work together to provide you with the care you need, when you need it.  Your next appointment:   2 month(s)  Provider:    You may see Timothy [name], MD or Bernardino Bring, PA-C

## 2024-04-09 ENCOUNTER — Ambulatory Visit: Admitting: Physician Assistant

## 2024-04-12 ENCOUNTER — Ambulatory Visit: Payer: Self-pay

## 2024-04-12 NOTE — Telephone Encounter (Signed)
 FYI Only or Action Required?: FYI only for provider: appointment scheduled on 04/13/24.  Patient was last seen in primary care on 12/16/2023 by Dana Nancyann BRAVO, MD.  Called Nurse Triage reporting Foot Swelling and Hypertension.  Symptoms began several days ago.  Interventions attempted: Prescription medications: BP meds.  Symptoms are: stable.  Triage Disposition: See PCP When Office is Open (Within 3 Days)  Patient/caregiver understands and will follow disposition?: Yes   Reason for Disposition  Systolic BP >= 160 OR Diastolic >= 100  Answer Assessment - Initial Assessment Questions Pt is also reporting elevated BP, 173/98 1 hour ago. Previous readings from last couple days 158/116 and 171/116, 186/89. Pt compliant with benazepril  (LOTENSIN ) 5 MG tablet (AM) and metoprolol  succinate (TOPROL  XL) 25 MG 24 hr tablet (PM). Experiencing intermittent headaches x3 days, relieved with Tylenol . Current reading while on the phone is 178/92 HR 89. Denies SOB, chest pain, blurred vision or vision changes.    1. ONSET: When did the swelling start? (e.g., minutes, hours, days)     About a week ago  2. LOCATION: What part of the leg is swollen?  Are both legs swollen or just one leg?     Left foot, just passed ankle  3. SEVERITY: How bad is the swelling? (e.g., localized; mild, moderate, severe)     Mild to moderate  4. REDNESS: Is there redness or signs of infection?     Has not noticed  5. PAIN: Is the swelling painful to touch? If Yes, ask: How painful is it?   (Scale 1-10; mild, moderate or severe)     Mild pain on top of foot  6. FEVER: Do you have a fever? If Yes, ask: What is it, how was it measured, and when did it start?      Unsure  7. CAUSE: What do you think is causing the leg swelling?     Unsure  8. MEDICAL HISTORY: Do you have a history of blood clots (e.g., DVT), cancer, heart failure, kidney disease, or liver failure?     Denies  9. RECURRENT  SYMPTOM: Have you had leg swelling before? If Yes, ask: When was the last time? What happened that time?     Only mild swelling, would go away on its own  10. OTHER SYMPTOMS: Do you have any other symptoms? (e.g., chest pain, difficulty breathing)       Denies  Protocols used: Leg Swelling and Edema-A-AH, Blood Pressure - High-A-AH  Message from [place] K sent at 04/12/2024  3:52 PM EST  Reason for Triage: Left foot and lower leg has swollen for about 1 week; her left foot on top hurts; for the last couple of days her blood pressure has been running high to her (158/116 and 171/116, 186/89), has headache; pain at a 7

## 2024-04-13 ENCOUNTER — Ambulatory Visit: Admitting: Family Medicine

## 2024-04-13 VITALS — BP 161/73 | HR 70 | Resp 16 | Ht 60.0 in | Wt 103.0 lb

## 2024-04-13 DIAGNOSIS — R609 Edema, unspecified: Secondary | ICD-10-CM | POA: Diagnosis not present

## 2024-04-13 DIAGNOSIS — E119 Type 2 diabetes mellitus without complications: Secondary | ICD-10-CM

## 2024-04-13 DIAGNOSIS — I10 Essential (primary) hypertension: Secondary | ICD-10-CM | POA: Diagnosis not present

## 2024-04-13 DIAGNOSIS — Z7984 Long term (current) use of oral hypoglycemic drugs: Secondary | ICD-10-CM

## 2024-04-13 MED ORDER — BENAZEPRIL HCL 5 MG PO TABS: 10.0000 mg | ORAL_TABLET | Freq: Every day | ORAL | Status: AC

## 2024-04-13 NOTE — Patient Instructions (Signed)
 Please review the attached list of medications and notify my office if there are any errors.   Increase benazepril  to 10mg  every day by taking 2 of the 5mg  tablets daily, at the same time

## 2024-04-13 NOTE — Progress Notes (Signed)
 "     Established patient visit   Patient: Dana Weber   DOB: 1961/02/18   64 y.o. Female  MRN: [id number] Visit Date: 04/13/2024  Today's healthcare provider: Nancyann Perry, MD   Chief Complaint  Patient presents with   Acute Visit     - Elevated BP, headaches, foot/ ankle swelling ( 1wk)   Subjective    Discussed the use of AI scribe software for clinical note transcription with the patient, who gave verbal consent to proceed.  History of Present Illness   Dana Weber is a 64 year old female with hypertension who presents with swollen feet and elevated blood pressure.  She has been experiencing swollen feet for about a week. The swelling almost returns to normal after keeping them elevated and wearing socks, but this time it has been persistent, affecting even her toes and causing pain on the top of her left foot, which still hurts despite the reduction in swelling.  She experiences regular heart flutters and takes metoprolol  25 mg for management. Her blood pressure was recently recorded at 170/116 mmHg. She is currently on benazepril  for blood pressure control with no recent changes in her medications or issues with running out of them.  Her blood sugar levels fluctuate, sometimes being well-controlled and other times not. She is on Rybelsus  7 mg, glipizide , metformin , and Farxiga  for diabetes management. She occasionally experiences stomach upset but generally tolerates the medications well.  She describes headaches occurring on the side and toward the back of her head, sometimes resembling sinus headaches. No unusual numbness or tingling in her hands, feet, or ankles.  She reports waking up with a dry mouth and throat, and her skin and nails have been dry and breaking, raising concerns about possible dehydration. She uses an automatic blood pressure cuff that goes around her upper arm to monitor her blood pressure at home. No chest pain, shortness of breath, or  unusual numbness or tingling in her extremities.     BP Readings from Last 3 Encounters:  04/13/24 (!) 161/73  02/07/24 (!) 150/80  12/23/23 (!) 148/86   Lab Results  Component Value Date   NA 139 12/16/2023   K 4.2 12/16/2023   CREATININE 0.64 12/16/2023   EGFR 100 12/16/2023   GLUCOSE 170 (H) 12/16/2023   Lab Results  Component Value Date   HGBA1C 8.9 (H) 12/16/2023   HGBA1C 7.4 (H) 12/17/2022   HGBA1C 8.8 (H) 03/26/2022     Medications: Show/hide medication list[1] Review of Systems  Constitutional:  Negative for appetite change, chills, fatigue and fever.  Respiratory:  Negative for chest tightness and shortness of breath.   Cardiovascular:  Negative for chest pain and palpitations.  Gastrointestinal:  Negative for abdominal pain, nausea and vomiting.  Neurological:  Negative for dizziness and weakness.       Objective    BP (!) 161/73 (BP Location: Left Arm, Patient Position: Sitting, Cuff Size: Small)   Pulse 70   Resp 16   Ht 5' (1.524 m)   Wt 103 lb (46.7 kg)   SpO2 99%   BMI 20.12 kg/m   Physical Exam   General: Appearance:    Well developed, well nourished female in no acute distress  Eyes:    PERRL, conjunctiva/corneas clear, EOM's intact       Lungs:     Clear to auscultation bilaterally, respirations unlabored  Heart:    Normal heart rate. Normal rhythm. No murmurs, rubs, or gallops.  MS:   All extremities are intact.    Neurologic:   Awake, alert, oriented x 3. No apparent focal neurological defect.         Assessment & Plan    1. Essential (primary) hypertension (Primary) Home BP recently running higher than usual. Double dose of benazepril  (LOTENSIN ) 5 MG tablet to Take 2 tablets (10 mg total) by mouth daily.  2. Edema, unspecified type About 1 week onset, improved with consistent feet elevation.  - CBC - Comprehensive metabolic panel with GFR - TSH  3. Diabetes mellitus without complication (HCC) Doing well with current medications  including 7mg  Rybelsus  which she is tolerating well.  - Hemoglobin A1c       Nancyann Perry, MD  [facility] [phone] (phone) [phone] (fax)  [facility]    [1]  Outpatient Medications Prior to Visit  Medication Sig   albuterol  (VENTOLIN  HFA) 108 (90 Base) MCG/ACT inhaler Inhale 2 puffs into the lungs every 6 (six) hours as needed for wheezing or shortness of breath.   aspirin 81 MG chewable tablet Chew 81 mg by mouth daily.   dapagliflozin  propanediol (FARXIGA ) 10 MG TABS tablet Take 1 tablet (10 mg total) by mouth daily before breakfast.   glipiZIDE  (GLUCOTROL  XL) 5 MG 24 hr tablet TAKE 1 TABLET BY MOUTH EVERY DAY WITH BREAKFAST   linaclotide  (LINZESS ) 72 MCG capsule 30 minutes before your first meal   lovastatin  (MEVACOR ) 20 MG tablet TAKE 1 TABLET BY MOUTH EVERY DAY   metFORMIN  (GLUCOPHAGE -XR) 500 MG 24 hr tablet TAKE 2 TABLETS BY MOUTH TWICE A DAY   metoprolol  succinate (TOPROL  XL) 25 MG 24 hr tablet Take 1 tablet (25 mg total) by mouth daily.   PARoxetine  (PAXIL ) 40 MG tablet TAKE 1 TABLET BY MOUTH EVERY DAY   Semaglutide  (RYBELSUS ) 7 MG TABS Take 1 tablet (7 mg total) by mouth daily.   Tiotropium Bromide Monohydrate  (SPIRIVA  RESPIMAT) 2.5 MCG/ACT AERS INHALE 2 PUFFS BY MOUTH INTO THE LUNGS DAILY   [DISCONTINUED] benazepril  (LOTENSIN ) 5 MG tablet TAKE 1 TABLET (5 MG TOTAL) BY MOUTH DAILY.   No facility-administered medications prior to visit.   "

## 2024-04-14 LAB — COMPREHENSIVE METABOLIC PANEL WITH GFR
ALT: 15 [IU]/L (ref 0–32)
AST: 17 [IU]/L (ref 0–40)
Albumin: 4.1 g/dL (ref 3.9–4.9)
Alkaline Phosphatase: 67 [IU]/L (ref 49–135)
BUN/Creatinine Ratio: 16 (ref 12–28)
BUN: 10 mg/dL (ref 8–27)
Bilirubin Total: 0.3 mg/dL (ref 0.0–1.2)
CO2: 22 mmol/L (ref 20–29)
Calcium: 9.7 mg/dL (ref 8.7–10.3)
Chloride: 100 mmol/L (ref 96–106)
Creatinine, Ser: 0.62 mg/dL (ref 0.57–1.00)
Globulin, Total: 2 g/dL (ref 1.5–4.5)
Glucose: 198 mg/dL — ABNORMAL HIGH (ref 70–99)
Potassium: 4.4 mmol/L (ref 3.5–5.2)
Sodium: 139 mmol/L (ref 134–144)
Total Protein: 6.1 g/dL (ref 6.0–8.5)
eGFR: 101 mL/min/{1.73_m2}

## 2024-04-14 LAB — CBC
Hematocrit: 45.6 % (ref 34.0–46.6)
Hemoglobin: 15.3 g/dL (ref 11.1–15.9)
MCH: 31.2 pg (ref 26.6–33.0)
MCHC: 33.6 g/dL (ref 31.5–35.7)
MCV: 93 fL (ref 79–97)
Platelets: 350 10*3/uL (ref 150–450)
RBC: 4.91 x10E6/uL (ref 3.77–5.28)
RDW: 12.7 % (ref 11.7–15.4)
WBC: 8.1 10*3/uL (ref 3.4–10.8)

## 2024-04-14 LAB — TSH: TSH: 1.05 u[IU]/mL (ref 0.450–4.500)

## 2024-04-14 LAB — HEMOGLOBIN A1C
Est. average glucose Bld gHb Est-mCnc: 180 mg/dL
Hgb A1c MFr Bld: 7.9 % — ABNORMAL HIGH (ref 4.8–5.6)

## 2024-04-17 ENCOUNTER — Ambulatory Visit: Payer: Self-pay | Admitting: Family Medicine

## 2024-05-14 ENCOUNTER — Ambulatory Visit (INDEPENDENT_AMBULATORY_CARE_PROVIDER_SITE_OTHER): Payer: PRIVATE HEALTH INSURANCE | Admitting: Vascular Surgery

## 2024-05-14 ENCOUNTER — Encounter (INDEPENDENT_AMBULATORY_CARE_PROVIDER_SITE_OTHER): Payer: PRIVATE HEALTH INSURANCE
